# Patient Record
Sex: Female | Born: 1940 | Race: White | Hispanic: No | Marital: Married | Smoking: Never smoker
Health system: Southern US, Community
[De-identification: ages and names within clinical notes are randomized; demographics above are authoritative.]

## PROBLEM LIST (undated history)

## (undated) DIAGNOSIS — F411 Generalized anxiety disorder: Secondary | ICD-10-CM

## (undated) DIAGNOSIS — I10 Essential (primary) hypertension: Secondary | ICD-10-CM

## (undated) DIAGNOSIS — M949 Disorder of cartilage, unspecified: Secondary | ICD-10-CM

## (undated) DIAGNOSIS — R002 Palpitations: Secondary | ICD-10-CM

## (undated) DIAGNOSIS — M899 Disorder of bone, unspecified: Secondary | ICD-10-CM

## (undated) DIAGNOSIS — E785 Hyperlipidemia, unspecified: Secondary | ICD-10-CM

## (undated) DIAGNOSIS — R0602 Shortness of breath: Secondary | ICD-10-CM

## (undated) DIAGNOSIS — M1990 Unspecified osteoarthritis, unspecified site: Secondary | ICD-10-CM

## (undated) HISTORY — DX: Disorder of bone, unspecified: M89.9

## (undated) HISTORY — DX: Hyperlipidemia, unspecified: E78.5

## (undated) HISTORY — PX: COLONOSCOPY: SHX174

## (undated) HISTORY — DX: Palpitations: R00.2

## (undated) HISTORY — DX: Disorder of cartilage, unspecified: M94.9

## (undated) HISTORY — DX: Essential (primary) hypertension: I10

## (undated) HISTORY — DX: Shortness of breath: R06.02

## (undated) HISTORY — DX: Generalized anxiety disorder: F41.1

## (undated) HISTORY — DX: Unspecified osteoarthritis, unspecified site: M19.90

---
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
## 1999-02-27 ENCOUNTER — Encounter: Payer: Self-pay | Admitting: Gynecology

## 1999-03-03 ENCOUNTER — Inpatient Hospital Stay (HOSPITAL_COMMUNITY): Admission: RE | Admit: 1999-03-03 | Discharge: 1999-03-05 | Payer: Self-pay | Admitting: Gynecology

## 1999-03-03 ENCOUNTER — Encounter (INDEPENDENT_AMBULATORY_CARE_PROVIDER_SITE_OTHER): Payer: Self-pay | Admitting: *Deleted

## 1999-05-26 HISTORY — PX: BILATERAL SALPINGOOPHORECTOMY: SHX1223

## 1999-05-26 HISTORY — PX: BREAST CYST ASPIRATION: SHX578

## 1999-05-26 HISTORY — PX: TOTAL ABDOMINAL HYSTERECTOMY: SHX209

## 1999-09-18 ENCOUNTER — Other Ambulatory Visit: Admission: RE | Admit: 1999-09-18 | Discharge: 1999-09-18 | Payer: Self-pay | Admitting: Gynecology

## 1999-10-22 ENCOUNTER — Other Ambulatory Visit: Admission: RE | Admit: 1999-10-22 | Discharge: 1999-10-22 | Payer: Self-pay | Admitting: Gynecology

## 2000-08-01 ENCOUNTER — Emergency Department (HOSPITAL_COMMUNITY): Admission: EM | Admit: 2000-08-01 | Discharge: 2000-08-01 | Payer: Self-pay | Admitting: Emergency Medicine

## 2000-11-01 ENCOUNTER — Other Ambulatory Visit: Admission: RE | Admit: 2000-11-01 | Discharge: 2000-11-01 | Payer: Self-pay | Admitting: Gynecology

## 2001-11-18 ENCOUNTER — Other Ambulatory Visit: Admission: RE | Admit: 2001-11-18 | Discharge: 2001-11-18 | Payer: Self-pay | Admitting: Gynecology

## 2003-02-28 ENCOUNTER — Ambulatory Visit (HOSPITAL_COMMUNITY): Admission: RE | Admit: 2003-02-28 | Discharge: 2003-02-28 | Payer: Self-pay | Admitting: Gastroenterology

## 2003-11-28 ENCOUNTER — Other Ambulatory Visit: Admission: RE | Admit: 2003-11-28 | Discharge: 2003-11-28 | Payer: Self-pay | Admitting: Gynecology

## 2004-05-27 ENCOUNTER — Emergency Department (HOSPITAL_COMMUNITY): Admission: EM | Admit: 2004-05-27 | Discharge: 2004-05-27 | Payer: Self-pay | Admitting: Emergency Medicine

## 2004-12-10 ENCOUNTER — Other Ambulatory Visit: Admission: RE | Admit: 2004-12-10 | Discharge: 2004-12-10 | Payer: Self-pay | Admitting: Gynecology

## 2005-06-10 ENCOUNTER — Ambulatory Visit (HOSPITAL_BASED_OUTPATIENT_CLINIC_OR_DEPARTMENT_OTHER): Admission: RE | Admit: 2005-06-10 | Discharge: 2005-06-10 | Payer: Self-pay | Admitting: Orthopedic Surgery

## 2005-06-10 ENCOUNTER — Encounter (INDEPENDENT_AMBULATORY_CARE_PROVIDER_SITE_OTHER): Payer: Self-pay | Admitting: *Deleted

## 2006-01-07 ENCOUNTER — Other Ambulatory Visit: Admission: RE | Admit: 2006-01-07 | Discharge: 2006-01-07 | Payer: Self-pay | Admitting: Gynecology

## 2006-10-11 ENCOUNTER — Ambulatory Visit: Payer: Self-pay | Admitting: Pulmonary Disease

## 2006-10-11 LAB — CONVERTED CEMR LAB
Basophils Absolute: 0 10*3/uL (ref 0.0–0.1)
Bilirubin Urine: NEGATIVE
Bilirubin, Direct: 0.1 mg/dL (ref 0.0–0.3)
Cholesterol: 178 mg/dL (ref 0–200)
Eosinophils Absolute: 0.1 10*3/uL (ref 0.0–0.6)
GFR calc Af Amer: 93 mL/min
GFR calc non Af Amer: 77 mL/min
Glucose, Bld: 96 mg/dL (ref 70–99)
HCT: 39.6 % (ref 36.0–46.0)
HDL: 47.9 mg/dL (ref >39.0)
Ketones, ur: NEGATIVE mg/dL
LDL Cholesterol: 103 mg/dL — ABNORMAL HIGH (ref 0–99)
MCHC: 35.6 g/dL (ref 30.0–36.0)
MCV: 92.4 fL (ref 78.0–100.0)
Monocytes Relative: 6.7 % (ref 3.0–11.0)
Neutrophils Relative %: 61.1 % (ref 43.0–77.0)
Nitrite: NEGATIVE
Potassium: 4.8 meq/L (ref 3.5–5.1)
RBC: 4.28 M/uL (ref 3.87–5.11)
RDW: 11.6 % (ref 11.5–14.6)
Sodium: 143 meq/L (ref 135–145)
Specific Gravity, Urine: <=1.005 (ref 1.000–1.03)
TSH: 0.99 microintl units/mL (ref 0.35–5.50)
Total CHOL/HDL Ratio: 3.7
Total Protein, Urine: NEGATIVE mg/dL
Triglycerides: 135 mg/dL (ref 0–149)
pH: 7 (ref 5.0–8.0)

## 2007-02-16 ENCOUNTER — Other Ambulatory Visit: Admission: RE | Admit: 2007-02-16 | Discharge: 2007-02-16 | Payer: Self-pay | Admitting: Gynecology

## 2007-03-24 ENCOUNTER — Ambulatory Visit: Payer: Self-pay | Admitting: Pulmonary Disease

## 2007-04-06 ENCOUNTER — Encounter: Payer: Self-pay | Admitting: Pulmonary Disease

## 2007-07-28 ENCOUNTER — Ambulatory Visit: Payer: Self-pay | Admitting: Pulmonary Disease

## 2007-07-28 DIAGNOSIS — I10 Essential (primary) hypertension: Secondary | ICD-10-CM | POA: Insufficient documentation

## 2007-07-28 DIAGNOSIS — R0602 Shortness of breath: Secondary | ICD-10-CM | POA: Insufficient documentation

## 2007-07-28 DIAGNOSIS — E785 Hyperlipidemia, unspecified: Secondary | ICD-10-CM

## 2007-08-01 LAB — CONVERTED CEMR LAB
Alkaline Phosphatase: 49 units/L (ref 39–117)
BUN: 15 mg/dL (ref 6–23)
Basophils Relative: 0.9 % (ref 0.0–1.0)
CO2: 31 meq/L (ref 19–32)
Creatinine, Ser: 1 mg/dL (ref 0.4–1.2)
Eosinophils Relative: 2.6 % (ref 0.0–5.0)
HCT: 41.5 % (ref 36.0–46.0)
Hemoglobin: 13.9 g/dL (ref 12.0–15.0)
Monocytes Absolute: 0.6 10*3/uL (ref 0.2–0.7)
Monocytes Relative: 7 % (ref 3.0–11.0)
Neutrophils Relative %: 61.5 % (ref 43.0–77.0)
Potassium: 4.6 meq/L (ref 3.5–5.1)
RDW: 12.4 % (ref 11.5–14.6)
Total Bilirubin: 0.9 mg/dL (ref 0.3–1.2)
Total Protein: 6.9 g/dL (ref 6.0–8.3)
WBC: 9.1 10*3/uL (ref 4.5–10.5)

## 2007-08-25 ENCOUNTER — Ambulatory Visit: Payer: Self-pay | Admitting: Pulmonary Disease

## 2007-08-25 DIAGNOSIS — R002 Palpitations: Secondary | ICD-10-CM | POA: Insufficient documentation

## 2007-08-25 DIAGNOSIS — F411 Generalized anxiety disorder: Secondary | ICD-10-CM | POA: Insufficient documentation

## 2007-08-28 DIAGNOSIS — M899 Disorder of bone, unspecified: Secondary | ICD-10-CM

## 2007-08-28 DIAGNOSIS — M1990 Unspecified osteoarthritis, unspecified site: Secondary | ICD-10-CM | POA: Insufficient documentation

## 2007-08-28 DIAGNOSIS — M949 Disorder of cartilage, unspecified: Secondary | ICD-10-CM

## 2008-02-22 ENCOUNTER — Encounter: Payer: Self-pay | Admitting: Pulmonary Disease

## 2008-02-29 ENCOUNTER — Ambulatory Visit: Payer: Self-pay | Admitting: Pulmonary Disease

## 2008-03-03 ENCOUNTER — Emergency Department (HOSPITAL_COMMUNITY): Admission: EM | Admit: 2008-03-03 | Discharge: 2008-03-03 | Payer: Self-pay | Admitting: Emergency Medicine

## 2008-03-21 ENCOUNTER — Ambulatory Visit: Payer: Self-pay | Admitting: Internal Medicine

## 2008-05-28 ENCOUNTER — Encounter: Payer: Self-pay | Admitting: Pulmonary Disease

## 2008-09-21 ENCOUNTER — Telehealth: Payer: Self-pay | Admitting: Pulmonary Disease

## 2009-03-08 ENCOUNTER — Ambulatory Visit: Payer: Self-pay | Admitting: Pulmonary Disease

## 2009-04-01 ENCOUNTER — Encounter: Payer: Self-pay | Admitting: Pulmonary Disease

## 2009-05-31 ENCOUNTER — Ambulatory Visit (HOSPITAL_COMMUNITY): Admission: RE | Admit: 2009-05-31 | Discharge: 2009-05-31 | Payer: Self-pay | Admitting: Gynecology

## 2009-06-17 ENCOUNTER — Telehealth (INDEPENDENT_AMBULATORY_CARE_PROVIDER_SITE_OTHER): Payer: Self-pay | Admitting: *Deleted

## 2009-08-05 ENCOUNTER — Ambulatory Visit: Payer: Self-pay | Admitting: Pulmonary Disease

## 2010-04-28 ENCOUNTER — Encounter: Payer: Self-pay | Admitting: Pulmonary Disease

## 2010-05-25 ENCOUNTER — Emergency Department (HOSPITAL_COMMUNITY)
Admission: EM | Admit: 2010-05-25 | Discharge: 2010-05-25 | Payer: Self-pay | Source: Home / Self Care | Admitting: Family Medicine

## 2010-06-04 ENCOUNTER — Ambulatory Visit (HOSPITAL_COMMUNITY)
Admission: RE | Admit: 2010-06-04 | Discharge: 2010-06-04 | Payer: Self-pay | Source: Home / Self Care | Attending: Gynecology | Admitting: Gynecology

## 2010-06-24 NOTE — Progress Notes (Signed)
Summary: Atenolol refill  Phone Note Call from Patient Call back at Home Phone [phone]   Caller: Patient Call For: nadel Reason for Call: Talk to Nurse Summary of Call: pt has appt 08/05/2009 - Needs refill on her Atenolol until she gwets in on 08/05/2009, [facility] [address] Initial call taken by: Eugene Gavia,  June 17, 2009 10:32 AM  Follow-up for Phone Call        Patient last seen 08/25/2007 by SN.  The patient has scheduled an appt with SN in March 2011. Please advise of Atenolol refills. Michel Bickers [facility]  June 17, 2009 10:40 AM   refill sent to pharmacy for pt---pt is aware Randell Loop CMA  June 17, 2009 10:44 AM     Prescriptions: ATENOLOL 50 MG  TABS (ATENOLOL) take 1/2 tab by mouth two times a day  #30 Each x 1   Entered by:   Randell Loop CMA   Authorized by:   Michele Mcalpine MD   Signed by:   Randell Loop CMA on 06/17/2009   Method used:   Electronically to        [facility]  [phone]* (retail)       [address]       [place], [place]  [address]       Ph: [phone] or [phone]       Fax: [phone]   RxID:   [id number]

## 2010-06-24 NOTE — Assessment & Plan Note (Signed)
Summary: rov/meds/apc   CC:  2 year ROV & review of medical problems....  History of Present Illness: 70 y/o WF here for a follow up visit...   ~  Apr09:  I last saw her 5/08 for a new pt CPX- see prob list below... she was in 07/28/07 to see the nurse practitioner c/o 2 week hx of a funny feeling on exhalation... interpreted as dyspnea- she had a CXR= clear lungs, borderline hrt size w/o change;  Labs= norm CBC/ CMet/ TSH.... she returns today and now describes the sensation more like a palpitation or fluttering that she tends to note more when she exhales... it is not exercise related and she works out on treadmill for 30+ min without symptoms... she drinks 2 cups of coffee per day, denies tea/ colas/ choc/ etc... denies pseudophed OTC meds, etc... she is a non-smoker... we decided to Rx w/ Alpraz & symptoms improved (everything apparently started after a traumatic shock)...   ~  August 05, 2009:  she notes only occas palpit/ dyspnea w/ stress... she's been doing well overall w/ BP controlled on meds;  and Chol checked by DrLomax last fall- OK... needs refill perscriptions today, requesting cream for rash, and due for PNEUMOVAX & TDAP...    Current Problems:   DYSPNEA (ICD-786.05) PALPITATIONS (ICD-785.1) - pt notes these started after an emotional shock, and symptoms have abated... she was rec to take Alprazolam but she declined to get this filled...  HYPERTENSION (ICD-401.9) - controlled on ATENOLOL 50mg - 1/2 tabBid... BP=112/64 and pt denies HA, fatigue, visual changes, CP, dizziness, syncope, edema, etc...  ~  she had a neg cardiac eval w/ treadmill 2006 from DrHSmith...  HYPERLIPIDEMIA (ICD-272.4) - on LIPITOR 20mg - 1/2 tab daily...   ~  last FLP 5/08 showed TChol 178, TG 135, HDL 48, LDL 103  ~  FLP 11/10 by DrLomax showed TChol 179, TG 140, HDL 55, LDL 105  ANXIETY (WUJ-811.91) - we discussed "catecholamine excess" in relation to her palpit/ dyspnea/ etc and rec Alpraz 1/2 to 1 tab by  mouth three times a day for these symptoms...  DEGENERATIVE JOINT DISEASE, MILD (ICD-715.90) - prev shot in knee from DrCollins...  OSTEOPENIA (ICD-733.90) - BMD's followed for yrs by her GYN DrLomax...  ~  last BMD 9/08 w/ TScores -0.5 to -1.5 and improved on Vivelle dot, Ca++, Vits...  GI = DrGanem w/ neg colon 2004 and neg sonar 2006... she uses Pepcid as needed. GU = DrLomax as noted, on Vivelle dot... [facility]...   Allergies: 1)  ! Penicillin 2)  ! Tetracycline  Comments:  Nurse/Medical Assistant: The patient's medications and allergies were reviewed with the patient and were updated in the Medication and Allergy Lists.  Past History:  Past Medical History:  DYSPNEA (ICD-786.05) PALPITATIONS (ICD-785.1) HYPERTENSION (ICD-401.9) HYPERLIPIDEMIA (ICD-272.4) ANXIETY (ICD-300.00) DEGENERATIVE JOINT DISEASE, MILD (ICD-715.90) OSTEOPENIA (ICD-733.90)  Past Surgical History: S/P TAH and BSO  Family History: Reviewed history from 03/21/2008 and no changes required. Father-Melonoma             Brain Tumor, deceased age 83 Mother-Pnuemonia, deceased age 11 3 Siblings- arthritis Family hx: allergies, asthma, arthritis  Social History: Reviewed history from 03/21/2008 and no changes required. Married 2 children Never Smoked Exercises 2 x week Drinks 3 cups caffeine/day  Review of Systems      See HPI  The patient denies anorexia, fever, weight loss, weight gain, vision loss, decreased hearing, hoarseness, chest pain, syncope, dyspnea on exertion, peripheral  edema, prolonged cough, headaches, hemoptysis, abdominal pain, melena, hematochezia, severe indigestion/heartburn, hematuria, incontinence, muscle weakness, suspicious skin lesions, transient blindness, difficulty walking, depression, unusual weight change, abnormal bleeding, enlarged lymph nodes, and angioedema.    Vital Signs:  Patient profile:   69 year old female Height:      61.5  inches Weight:      129 pounds BMI:     24.07 O2 Sat:      99 % on Room air Temp:     99.1 degrees F oral Pulse rate:   67 / minute BP sitting:   112 / 64  (left arm) Cuff size:   regular  Vitals Entered By: Randell Loop CMA (August 05, 2009 10:37 AM)  O2 Sat at Rest %:  99 O2 Flow:  Room air CC: 2 year ROV & review of medical problems... Is Patient Diabetic? No Pain Assessment Patient in pain? no      Comments meds updated today   Physical Exam  Additional Exam:  WD, WN, 71 y/o WF in NAD... GENERAL:  Alert & oriented; pleasant & cooperative... HEENT:  Eugenio Saenz/AT, EOM-wnl, PERRLA, EACs-clear, TMs-wnl, NOSE-clear, THROAT-clear & wnl. NECK:  Supple w/ full ROM; no JVD; normal carotid impulses w/o bruits; no thyromegaly or nodules palpated; no lymphadenopathy. CHEST:  Clear to P & A; without wheezes/ rales/ or rhonchi. HEART:  Regular Rhythm; without murmurs/ rubs/ or gallops. ABDOMEN:  Soft & nontender; normal bowel sounds; no organomegaly or masses detected. EXT: without deformities or arthritic changes; no varicose veins/ venous insuffic/ or edema. NEURO:  CN's intact; motor testing normal; sensory testing normal; gait normal & balance OK. DERM:  No lesions noted; no rash etc...    Impression & Recommendations:  Problem # 1:  DYSPNEA (ICD-786.05) Dyspnea & Palpit are improved... they started w/ an emotional shock, she says & now improved... she knows to avoid caffeine & doesn't use pseudophed etc...  Problem # 2:  HYPERTENSION (ICD-401.9) BP controlled-  same meds. Her updated medication list for this problem includes:    Atenolol 50 Mg Tabs (Atenolol) .Marland Kitchen... Take 1/2 tab by mouth two times a day  Problem # 3:  HYPERLIPIDEMIA (ICD-272.4) FLP by DrLomax 11/10 reviewd... labs look good continue diet + exercise & the 10mg  Lipitor... Her updated medication list for this problem includes:    Lipitor 20 Mg Tabs (Atorvastatin calcium) .Marland Kitchen... Take 1/2 tab by mouth once  daily  Problem # 4:  DEGENERATIVE JOINT DISEASE, MILD (ICD-715.90) Aware-  pt is stable... Her updated medication list for this problem includes:    Low-dose Aspirin 81 Mg Tabs (Aspirin) .Marland Kitchen... Take 1 tablet by mouth once a day  Problem # 5:  OTHER MEDICAL PROBLEMS AS NOTED>>>  Complete Medication List: 1)  Low-dose Aspirin 81 Mg Tabs (Aspirin) .... Take 1 tablet by mouth once a day 2)  Atenolol 50 Mg Tabs (Atenolol) .... Take 1/2 tab by mouth two times a day 3)  Lipitor 20 Mg Tabs (Atorvastatin calcium) .... Take 1/2 tab by mouth once daily 4)  Vivelle-dot 0.075 Mg/24hr Pttw (Estradiol) .... Take 1 tablet by mouth once a day 5)  Vitamin D3 2000 Unit Caps (Cholecalciferol) .... Take 1 tablet by mouth once a day 6)  Caltrate 600+d 600-400 Mg-unit Tabs (Calcium carbonate-vitamin d) .... Take one tab by mouth two times a day... 7)  Ocuvite Adult Formula Caps (Multiple vitamins-minerals) .... Take 1 capsule by mouth once a day 8)  Fluocinonide 0.05 % Crea (Fluocinonide) .... Apply  as needed...  Other Orders: Prescription Created Electronically [id number]) Tdap => 73yrs IM (98119) Admin 1st Vaccine (14782) Pneumococcal Vaccine (95621) Admin of Any Addtl Vaccine (30865)  Patient Instructions: 1)  Today we updated your med list- see below.... 2)  We refilled your meds for 2011... 3)  We also wrote a new perscription for a cream to use as we discussed... 4)  We also gave you 2 vaccinations today>> PNEUMOVAX (Pneumonia vaccine- this is the only one you will need based on 2011 criteria);  and the TDAP (combined tetanus & pertussis vaccine- indicated every 37yrs). 5)  Call for any problems.Marland KitchenMarland Kitchen 6)  Please schedule a follow-up appointment in 1 year, sooner as needed. Prescriptions: FLUOCINONIDE 0.05 % CREA (FLUOCINONIDE) apply as needed...  #30 gm tube x prn   Entered and Authorized by:   Michele Mcalpine MD   Signed by:   Michele Mcalpine MD on 08/05/2009   Method used:   Print then Give to Patient    RxID:   [id number] LIPITOR 20 MG  TABS (ATORVASTATIN CALCIUM) take 1/2 tab by mouth once daily  #30 x prn   Entered and Authorized by:   Michele Mcalpine MD   Signed by:   Michele Mcalpine MD on 08/05/2009   Method used:   Print then Give to Patient   RxID:   [id number] ATENOLOL 50 MG  TABS (ATENOLOL) take 1/2 tab by mouth two times a day  #30 x prn   Entered and Authorized by:   Michele Mcalpine MD   Signed by:   Michele Mcalpine MD on 08/05/2009   Method used:   Print then Give to Patient   RxID:   [id number]    Immunizations Administered:  Tetanus Vaccine:    Vaccine Type: Tdap    Site: left deltoid    Mfr: boostrix    Dose: 0.5 ml    Route: IM    Given by: Randell Loop CMA    Exp. Date: 11/23/2010    Lot #: [id number]    VIS given: 04/12/07 version given August 05, 2009.  Pneumonia Vaccine:    Vaccine Type: Pneumovax    Site: right deltoid    Mfr: Merck    Dose: 0.5 ml    Route: IM    Given by: Randell Loop CMA    Exp. Date: 01/06/2011    Lot #: [id number]    VIS given: 12/21/95 version given August 05, 2009.

## 2010-06-26 NOTE — Letter (Signed)
Summary: Gretta Cool MD  Gretta Cool MD   Imported By: Lester Rialto 05/08/2010 10:05:41  _____________________________________________________________________  External Attachment:    Type:   Image     Comment:   External Document

## 2010-07-23 ENCOUNTER — Telehealth (INDEPENDENT_AMBULATORY_CARE_PROVIDER_SITE_OTHER): Payer: Self-pay | Admitting: *Deleted

## 2010-07-31 NOTE — Progress Notes (Signed)
Summary: heart rate/ bp  Phone Note Call from Patient Call back at [facility] Phone [phone]   Caller: Patient Call For: nadel Summary of Call: pt's heart rate was 135 yesterday "about half an hr after excercising". today her bp is 135/ 75. pt wants to know if she should make any changes. she is going to [place] next week and just wants dr Kirstie Mirza recs before she goes.  Initial call taken by: Tivis Ringer, CNA,  July 23, 2010 9:11 AM  Follow-up for Phone Call        Pt states her baseline BP-118-120/60's, was on the Elliptical yesterday and felt an irregular heartrate. Pt waited approx 30 mins before checking same again and was P-135. Pt denies headaches, vision changes, dizziness, nausea. Today BP-135/75. Pt requested SN recs since she will be going out of town. Pt states no change in medications, still taking Atenolol 50mg  1/2 twice daily. Please advise. Thanks. Zackery Barefoot CMA  July 23, 2010 11:37 AM   Additional Follow-up for Phone Call Additional follow up Details #1::        per SN---needs to avoid all caffeine--coffee,tea, cola, chocolate etc...   she had cardiac treadmill in 2006 by Dr. Lavell Islam are for cardiology follow up if these symptoms persist.  thanks Randell Loop CMA  July 23, 2010 2:40 PM   Called, spoke with pt.  She was informed of above recs per SN and verbalized understanding.  States she will avoid caffeine and monitor sxs.  If they persist she will see cardiology -- will call back if referral needed since last cardiology visit was in 2006.  Also advised pt if sxs worsen while out of town to go to ER/Urgent Care to be evaluated.  She verbalized understanding. Additional Follow-up by: Gweneth Dimitri RN,  July 23, 2010 2:54 PM

## 2010-08-12 ENCOUNTER — Encounter: Payer: Self-pay | Admitting: Pulmonary Disease

## 2010-08-15 ENCOUNTER — Ambulatory Visit: Payer: Self-pay | Admitting: Pulmonary Disease

## 2010-08-18 ENCOUNTER — Encounter: Payer: Self-pay | Admitting: Pulmonary Disease

## 2010-08-18 ENCOUNTER — Ambulatory Visit (INDEPENDENT_AMBULATORY_CARE_PROVIDER_SITE_OTHER): Payer: Medicare Other | Admitting: Pulmonary Disease

## 2010-08-18 DIAGNOSIS — E785 Hyperlipidemia, unspecified: Secondary | ICD-10-CM

## 2010-08-18 DIAGNOSIS — F411 Generalized anxiety disorder: Secondary | ICD-10-CM

## 2010-08-18 DIAGNOSIS — M899 Disorder of bone, unspecified: Secondary | ICD-10-CM

## 2010-08-18 DIAGNOSIS — I10 Essential (primary) hypertension: Secondary | ICD-10-CM

## 2010-08-18 MED ORDER — ATORVASTATIN CALCIUM 40 MG PO TABS: 40.0000 mg | ORAL_TABLET | Freq: Every day | ORAL | Status: DC

## 2010-08-18 MED ORDER — ATENOLOL 50 MG PO TABS: 50.0000 mg | ORAL_TABLET | Freq: Every day | ORAL | Status: DC

## 2010-08-18 MED ORDER — ATORVASTATIN CALCIUM 20 MG PO TABS: ORAL_TABLET | ORAL | Status: DC

## 2010-08-18 NOTE — Assessment & Plan Note (Signed)
She has been doing very well;  Could have used some alpraz on the flight to [place] but managed OK;  Doesn't want refill Rx at this time.Marland KitchenMarland Kitchen

## 2010-08-18 NOTE — Progress Notes (Signed)
  Subjective:    Patient ID: Katelyn [name], female    DOB: 06/19/1940, 70 y.o.   MRN: [id number]  HPI 70 y/o WF here for a follow up visit... She has mult medical problems including:  HBP;  Hyperlipidemia;  DJD;  Osteopenia; and Anxiety...   ~  August 18, 2010:  Yearly ROV doing well w/o new complaints or concerns... Just ret from trip to [place] 7 did some hiking etc all w/o difficulty.    HBP>  On Atenolol 50mg - taking 1/2 Bid (her choice) & tol well, BP= 116/84 & similar at home, denies CP, palpit, SOB, edema, etc...    Chol>  On Lipitor 20mg - taking 1/2 tab daily;  FLP by [facility] 12/11 showed TChol 157, TG 101, HDL 51, LDL 92; continue same...    DJD/ Osteopenia>  Followed by [facility] Prima- last BMD 2008 showed TScores -0.5 to -1.5 & she has been on Vivelle dot, calcium, MVI, Vity D 2000u/d;  I would rec f/u BMD & she will check w/ GYN about this;  Notes some mild arthritic complaints intermittently> knees, neck, back & uses OTC Aleve etc...     Anxiety>  She had some Alpraz to use in the past but doing fine & does not want refill at this time...  Health Maintenance: GI = DrGanem w/ neg colon 2004 and neg sonar 2006... she uses Pepcid as needed; f/u colon in 2014... GU = DrLomax as noted, on Vivelle dot... [facility])... She had Pneumovax & TDAP 3/11; and rec to get the yearly seasonal flu vaccine in the fall.   Review of Systems       See HPI  The patient denies anorexia, fever, weight loss, weight gain, vision loss, decreased hearing, hoarseness, chest pain, syncope, dyspnea on exertion, peripheral edema, prolonged cough, headaches, hemoptysis, abdominal pain, melena, hematochezia, severe indigestion/heartburn, hematuria, incontinence, muscle weakness, suspicious skin lesions, transient blindness, difficulty walking, depression, unusual weight change, abnormal bleeding, enlarged lymph nodes, and angioedema.     Objective:   Physical Exam      WD, WN, 70  y/o WF in NAD... GENERAL:  Alert & oriented; pleasant & cooperative... HEENT:  /AT, EOM-wnl, PERRLA, EACs-clear, TMs-wnl, NOSE-clear, THROAT-clear & wnl. NECK:  Supple w/ full ROM; no JVD; normal carotid impulses w/o bruits; no thyromegaly or nodules palpated; no lymphadenopathy. CHEST:  Clear to P & A; without wheezes/ rales/ or rhonchi. HEART:  Regular Rhythm; without murmurs/ rubs/ or gallops. ABDOMEN:  Soft & nontender; normal bowel sounds; no organomegaly or masses detected. EXT: without deformities or arthritic changes; no varicose veins/ venous insuffic/ or edema. NEURO:  CN's intact; motor testing normal; sensory testing normal; gait normal & balance OK. DERM:  No lesions noted; no rash etc...   Assessment & Plan:

## 2010-08-18 NOTE — Assessment & Plan Note (Signed)
BP very stable on her Atenolol 50mg - 1/2 Bid;  We discussed low sodium diet etc;  Continue same med.

## 2010-08-18 NOTE — Assessment & Plan Note (Signed)
She has her labs done by drLomax & he sent copies to [place] from her 12/11 eval>  FLP looks good on her Lipitor 20mg - taking 1/2 tab daily;  Continue same + diet efforts.

## 2010-08-18 NOTE — Assessment & Plan Note (Signed)
She has her BMDs done by DrLomax & I reviewed his note from 12/11> last BMD 2008 & she had mod osteopenia w/ TScore -1.5 (?FemNeck);  I'd rec f/u BMD at this time & she will check w/ DrLomax for his opinion.Marland KitchenMarland Kitchen

## 2010-08-18 NOTE — Patient Instructions (Signed)
Katelyn [name], it was good seeing you again... We updated your med list today... We refilled your Atenolol & Lipitor as discussed... We reviewed your recent data & you appear to be up to date on all needed screening & vaccinations... Stay as active as possible... Call for any problems... When it is convenient> give DrLomax a call to inquire when your next Bone Density test is due... Let's plan a follow up visit in 49yr, sooner if needed.Marland KitchenMarland Kitchen

## 2010-09-08 ENCOUNTER — Telehealth: Payer: Self-pay | Admitting: Pulmonary Disease

## 2010-09-08 NOTE — Telephone Encounter (Signed)
Spoke w/ denise and she just wanted to clarify pt rx for atenolol. I advised him per rx pt to take 1/2 tablet bid. Denis verbalized understanding and needed nothing else further

## 2010-10-10 NOTE — Op Note (Signed)
NAME:  Katelyn [name], Katelyn [name]           ACCOUNT NO.:  [id number]   MEDICAL RECORD NO.:  [id number]          PATIENT TYPE:  AMB   LOCATION:  DSC                          FACILITY:  [facility]   PHYSICIAN:  Matthew A. 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   OPERATION PERFORMED:  Excisional biopsy of masses x 2.   SURGEON:  Artist Pais. Mina Marble, M.D.   ASSISTANT:  None.   ANESTHESIA:  General.   TOURNIQUET TIME:  28 minutes.   COMPLICATIONS:  None.   DRAINS:  None.   SPECIMENS:  Two were sent.   DESCRIPTION OF PROCEDURE:  The patient was taken to the operating room and  after the induction of adequate general anesthesia the left upper extremity  was prepped and draped in the usual sterile fashion.  An Esmarch was used to  exsanguinate the limb.  Tourniquet was inflated to 250 mmHg.  At that point  a 2.5 to 3 cm incision was made over the volar aspect of the left wrist and  into the interval between the flexor carpi radialis and the radial artery.  Skin was incised.  Dissection was carried down.  The flexor carpi radialis  sheath was incised and this was retracted.  The radial artery retracted as  well.  A cystic type mass was seen coming from between these two.  This was  carefully removed.  Hemostasis was achieved with bipolar cautery.  The wound  was irrigated and was closed with 3-0 Prolene in subcuticular stitch.  The  second incision was made 4 cm proximal and radial to this over a large  subcutaneous mass.  The skin was incised.  There was a large lipomatous type  lesion, coming from just under the brachioradialis insertion.  This was  carefully dissected free with care to carefully identify and retract the  superficial  radial nerve and  branches of the radial artery.  After this was done, this  wound was also thoroughly irrigated and loosely closed with 3-0 Prolene  subcuticular stitch, Steri-Strips, 4 x 4s and fluffs and a volar splint was  applied.  The patient tolerated the procedure well and went to recovery room  in stable fashion.      Artist Pais Mina Marble, M.D.  Electronically Signed     MAW/MEDQ  D:  06/10/2005  T:  06/10/2005  Job:  [id number]

## 2010-10-10 NOTE — Op Note (Signed)
   NAME:  [name], Katelyn LEE                   ACCOUNT NO.:  [id number]   MEDICAL RECORD NO.:  [id number]                   PATIENT TYPE:  AMB   LOCATION:  ENDO                                 [facility]   PHYSICIAN:  Graylin Shiver, M.D.                DATE OF BIRTH:  November 01, 1940   DATE OF PROCEDURE:  02/28/2003  DATE OF DISCHARGE:                                 OPERATIVE REPORT   PROCEDURE:  Colonoscopy.   INDICATIONS FOR PROCEDURE:  Screening.   Informed consent was obtained after explanation of the risks of bleeding,  infection and perforation.   PREMEDICATION:  Fentanyl 87.5 mcg IV, Versed 7 mg IV.   DESCRIPTION OF PROCEDURE:  With the patient in the left lateral decubitus  position, a rectal exam was performed and no masses were felt. The Olympus  colonoscope was then inserted into the rectum and advanced around the colon  to the cecum. Cecal landmarks were identified. The cecum and ascending colon  were normal. The transverse colon was normal.  The descending colon, sigmoid  and rectum were normal. She tolerated the procedure well without  complications.   IMPRESSION:  Normal colonoscopy to the cecum.                                               Graylin Shiver, M.D.    SFG/MEDQ  D:  02/28/2003  T:  02/28/2003  Job:  [id number]   cc:   Al Decant. Janey Greaser, MD  [address]  [place]  [place] [address]  Fax: [phone]

## 2011-02-23 LAB — CBC
MCV: 91.9
RBC: 4.4
WBC: 6.8

## 2011-02-23 LAB — DIFFERENTIAL
Basophils Absolute: 0
Basophils Relative: 0
Eosinophils Relative: 2
Monocytes Absolute: 0.5
Neutro Abs: 4.1

## 2011-02-23 LAB — POCT CARDIAC MARKERS
CKMB, poc: 1.3
Myoglobin, poc: 51.5

## 2011-03-13 ENCOUNTER — Encounter: Payer: Self-pay | Admitting: Adult Health

## 2011-03-13 ENCOUNTER — Ambulatory Visit (INDEPENDENT_AMBULATORY_CARE_PROVIDER_SITE_OTHER): Payer: Medicare Other | Admitting: Adult Health

## 2011-03-13 VITALS — BP 114/82 | HR 61 | Temp 97.5°F | Ht 61.6 in | Wt 129.6 lb

## 2011-03-13 DIAGNOSIS — K219 Gastro-esophageal reflux disease without esophagitis: Secondary | ICD-10-CM | POA: Insufficient documentation

## 2011-03-13 DIAGNOSIS — Z23 Encounter for immunization: Secondary | ICD-10-CM

## 2011-03-13 NOTE — Patient Instructions (Signed)
High fiber diet, add benefiber daily  Stool softner At bedtime  For 2 weeks then As needed  For constipation  Nexium 40 mg daily for 10 days  Gas X with meals As needed   Increase fluid intake.  Please contact office for sooner follow up if symptoms do not improve or worsen or seek emergency care   Flu shot today

## 2011-03-13 NOTE — Assessment & Plan Note (Signed)
Mild flare with gastritis/constipation   Plan;  High fiber diet, add benefiber daily  Stool softner At bedtime  For 2 weeks then As needed  For constipation  Nexium 40 mg daily for 10 days -samples given  Gas X with meals As needed   Increase fluid intake.  Please contact office for sooner follow up if symptoms do not improve or worsen or seek emergency care   Flu shot today

## 2011-03-13 NOTE — Progress Notes (Signed)
  Subjective:    Patient ID: Katelyn [name], female    DOB: 12-11-1940, 70 y.o.   MRN: [id number]  HPI  70 y/o WF with  mult medical problems including:  HBP;  Hyperlipidemia;  DJD;  Osteopenia; and Anxiety...   ~  August 18, 2010:  Yearly ROV doing well w/o new complaints or concerns... Just ret from trip to [place] 7 did some hiking etc all w/o difficulty.    HBP>  On Atenolol 50mg - taking 1/2 Bid (her choice) & tol well, BP= 116/84 & similar at home, denies CP, palpit, SOB, edema, etc...    Chol>  On Lipitor 20mg - taking 1/2 tab daily;  FLP by [facility] 12/11 showed TChol 157, TG 101, HDL 51, LDL 92; continue same...    DJD/ Osteopenia>  Followed by Ahmed Prima- last BMD 2008 showed TScores -0.5 to -1.5 & she has been on Vivelle dot, calcium, MVI, Vity D 2000u/d;  I would rec f/u BMD & she will check w/ GYN about this;  Notes some mild arthritic complaints intermittently> knees, neck, back & uses OTC Aleve etc...     Anxiety>  She had some Alpraz to use in the past but doing fine & does not want refill at this time...  Health Maintenance: GI = DrGanem w/ neg colon 2004 and neg sonar 2006... she uses Pepcid as needed; f/u colon in 2014... GU = DrLomax as noted, on Vivelle dot... [facility])... She had Pneumovax & TDAP 3/11; and rec to get the yearly seasonal flu vaccine in the fall.  03/13/2011 Acute OV  Complains of increased gas and stomach discomfort, epigastric, with  Some Belching for 1 month. Feels bloated and gas.  No vomitting , nausea, diarrhea, bloody stools. Stools have been harder, feels she does not completely empty after BM. No new meds or travel. Under some stress at home with home renovations.   Review of Systems        See HPI  The patient denies anorexia, fever, weight loss, weight gain, vision loss, decreased hearing, hoarseness, chest pain, syncope, dyspnea on exertion, peripheral edema, prolonged cough, headaches, hemoptysis, abdominal pain,  melena, hematochezia, severe indigestion/heartburn, hematuria, incontinence, muscle weakness, suspicious skin lesions, transient blindness, difficulty walking, depression, unusual weight change, abnormal bleeding, enlarged lymph nodes, and angioedema.     Objective:   Physical Exam       WD, WN, 70 y/o WF in NAD... GENERAL:  Alert & oriented; pleasant & cooperative... HEENT:  [place]/AT, EOM-wnl, PERRLA, EACs-clear, TMs-wnl, NOSE-clear, THROAT-clear & wnl. NECK:  Supple w/ full ROM; no JVD; normal carotid impulses w/o bruits; no thyromegaly or nodules palpated; no lymphadenopathy. CHEST:  Clear to P & A; without wheezes/ rales/ or rhonchi. HEART:  Regular Rhythm; without murmurs/ rubs/ or gallops. ABDOMEN:  Soft & nontender; normal bowel sounds; no organomegaly or masses detected. No guarding or rebound , neg CVA tendness.  EXT: without deformities or arthritic changes; no varicose veins/ venous insuffic/ or edema. NEURO:  gait normal & balance OK. DERM:  No lesions noted; no rash etc...   Assessment & Plan:

## 2011-03-13 NOTE — Progress Notes (Signed)
Addended by: Abigail Miyamoto D on: 03/13/2011 11:55 AM   Modules accepted: Orders

## 2011-06-16 ENCOUNTER — Other Ambulatory Visit (HOSPITAL_COMMUNITY): Payer: Self-pay | Admitting: Gynecology

## 2011-06-16 DIAGNOSIS — Z1231 Encounter for screening mammogram for malignant neoplasm of breast: Secondary | ICD-10-CM

## 2011-06-30 ENCOUNTER — Other Ambulatory Visit: Payer: Self-pay | Admitting: Gynecology

## 2011-07-08 ENCOUNTER — Ambulatory Visit (HOSPITAL_COMMUNITY)
Admission: RE | Admit: 2011-07-08 | Discharge: 2011-07-08 | Disposition: A | Discharge status: Home / Self Care | Payer: Medicare Other | Source: Ambulatory Visit | Attending: Gynecology | Admitting: Gynecology

## 2011-07-08 DIAGNOSIS — Z1231 Encounter for screening mammogram for malignant neoplasm of breast: Secondary | ICD-10-CM | POA: Insufficient documentation

## 2011-08-18 ENCOUNTER — Ambulatory Visit (INDEPENDENT_AMBULATORY_CARE_PROVIDER_SITE_OTHER): Payer: Medicare Other | Admitting: Pulmonary Disease

## 2011-08-18 ENCOUNTER — Encounter: Payer: Self-pay | Admitting: Pulmonary Disease

## 2011-08-18 VITALS — BP 120/84 | HR 66 | Temp 97.5°F | Ht 61.5 in | Wt 133.2 lb

## 2011-08-18 DIAGNOSIS — E785 Hyperlipidemia, unspecified: Secondary | ICD-10-CM

## 2011-08-18 DIAGNOSIS — I10 Essential (primary) hypertension: Secondary | ICD-10-CM

## 2011-08-18 DIAGNOSIS — M899 Disorder of bone, unspecified: Secondary | ICD-10-CM

## 2011-08-18 DIAGNOSIS — M949 Disorder of cartilage, unspecified: Secondary | ICD-10-CM

## 2011-08-18 DIAGNOSIS — M1990 Unspecified osteoarthritis, unspecified site: Secondary | ICD-10-CM

## 2011-08-18 DIAGNOSIS — F411 Generalized anxiety disorder: Secondary | ICD-10-CM

## 2011-08-18 MED ORDER — ATENOLOL 50 MG PO TABS: ORAL_TABLET | ORAL | Status: DC

## 2011-08-18 MED ORDER — ATORVASTATIN CALCIUM 20 MG PO TABS: ORAL_TABLET | ORAL | Status: DC

## 2011-08-18 NOTE — Patient Instructions (Signed)
Today we updated your med list in our EPIC system...    Continue your current medications the same...  We refilled your meds per request...  If you get a chance, ask DrLomax to forward copies of your Bone Density 7 blood work to my attention. Thanks.  Call for any problems or if we can be of service in any way...  Let's plan a routine follow up in one years time.Marland KitchenMarland Kitchen

## 2011-08-18 NOTE — Progress Notes (Addendum)
Subjective:    Patient ID: Katelyn [name], female    DOB: June 04, 1940, 71 y.o.   MRN: [id number]  HPI 71 y/o WF here for a follow up visit... She has mult medical problems including:  HBP;  Hyperlipidemia;  DJD;  Osteopenia; and Anxiety...   ~  August 18, 2010:  Yearly ROV doing well w/o new complaints or concerns... Just ret from trip to [place] & did some hiking etc all w/o difficulty.    HBP>  On Atenolol 50mg - taking 1/2 Bid (her choice) & tol well, BP= 116/84 & similar at home, denies CP, palpit, SOB, edema, etc...    Chol>  On Lipitor 20mg - taking 1/2 tab daily;  FLP by [facility] 12/11 showed TChol 157, TG 101, HDL 51, LDL 92; continue same...    DJD/ Osteopenia>  Followed by Ahmed Prima- last BMD 2008 showed TScores -0.5 to -1.5 & she has been on Vivelle dot, calcium, MVI, Vity D 2000u/d;  I would rec f/u BMD & she will check w/ GYN about this;  Notes some mild arthritic complaints intermittently> knees, neck, back & uses OTC Aleve etc...     Anxiety>  She had some Alpraz to use in the past but doing fine & does not want refill at this time...  ~  August 18, 2011:  Yearly ROV & Katelyn [name] has had a good year, no new complaints or concerns;  She continues to follow up w/ DrLomax, GYN & she tells me that he does her Labs and BMDs> asked to get copies forwarded to [place] for our review but she hasn't done this in the past & reminded to keep [place] in the loop... We reviewed her Meds & Probl list> see below>>   Problem List:   << PROBLEM LIST UPDATED 08/18/11 >>  DYSPNEA (ICD-786.05) PALPITATIONS (ICD-785.1) - pt notes these started after an emotional shock, and symptoms have abated... she was rec to take Alprazolam but she declined to get this filled...  HYPERTENSION (ICD-401.9) - controlled on ATENOLOL 50mg - 1/2 tabBid... ~  she had a neg cardiac eval w/ treadmill 2006 from DrHSmith... ~  3/12:  BP=112/64 and pt denies HA, fatigue, visual changes, CP, dizziness, syncope, edema, etc... ~  3/13:  BP= 120/84  and she remains asymptomatic...  HYPERLIPIDEMIA (ICD-272.4) - on LIPITOR 20mg - 1/2 tab daily...  ~  last FLP 5/08 showed TChol 178, TG 135, HDL 48, LDL 103 ~  FLP 11/10 by DrLomax showed TChol 179, TG 140, HDL 55, LDL 105 ~  She states f/u labs done by DrLomax but we don't have copies; asked to get records for [place] or allow [place] to check her labs...  DEGENERATIVE JOINT DISEASE, MILD (ICD-715.90) - prev shot in knee from DrCollins...  OSTEOPENIA (ICD-733.90) - BMD's followed for yrs by her GYN DrLomax... ~  last BMD 9/08 w/ TScores -0.5 to -1.5 and improved on Vivelle dot, Ca++, Vits... ~  She is asked to get copies of her BMD sent to [place] to review...  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  Health Maintenance: GI = DrGanem w/ neg colon 2004 and neg sonar 2006... she uses Pepcid as needed; f/u colon in 2014... GU = DrLomax as noted, on Vivelle dot... [facility])... She had Pneumovax & TDAP 3/11; and rec to get the yearly seasonal flu vaccine in the  fall.   Past Surgical History  Procedure Date  . Total abdominal hysterectomy   . Bilateral salpingoophorectomy     Outpatient Encounter Prescriptions as of 08/18/2011  Medication Sig Dispense Refill  . aspirin 81 MG EC tablet Take 81 mg by mouth daily.        Marland Kitchen atenolol (TENORMIN) 50 MG tablet Take 1/2 by mouth two times a day  90 tablet  3  . atorvastatin (LIPITOR) 20 MG tablet Take as directed  90 tablet  3  . Calcium Citrate-Vitamin D (CITRACAL PETITES/VITAMIN D PO) Take 1 tablet by mouth daily.        . Cholecalciferol (VITAMIN D3) 2000 UNITS TABS Take 1 tablet by mouth daily.        Marland Kitchen estradiol (VIVELLE-DOT) 0.075 MG/24HR Place 1 patch onto the skin 2 (two) times a week.        . fluocinonide (LIDEX) 0.05 % cream Apply topically 2 (two) times daily.        . Multiple  Vitamins-Minerals (OCUVITE ADULT 50+ PO) Take 1 tablet by mouth daily.          Allergies  Allergen Reactions  . Penicillins     REACTION: rash  . Tetracycline     REACTION: rash    Current Medications, Allergies, Past Medical History, Past Surgical History, Family History, and Social History were reviewed in [employer] record.    Review of Systems       See HPI - all other systems neg except as noted...  The patient denies anorexia, fever, weight loss, weight gain, vision loss, decreased hearing, hoarseness, chest pain, syncope, dyspnea on exertion, peripheral edema, prolonged cough, headaches, hemoptysis, abdominal pain, melena, hematochezia, severe indigestion/heartburn, hematuria, incontinence, muscle weakness, suspicious skin lesions, transient blindness, difficulty walking, depression, unusual weight change, abnormal bleeding, enlarged lymph nodes, and angioedema.     Objective:   Physical Exam      WD, WN, 71 y/o WF in NAD... GENERAL:  Alert & oriented; pleasant & cooperative... HEENT:  [place]/AT, EOM-wnl, PERRLA, EACs-clear, TMs-wnl, NOSE-clear, THROAT-clear & wnl. NECK:  Supple w/ full ROM; no JVD; normal carotid impulses w/o bruits; no thyromegaly or nodules palpated; no lymphadenopathy. CHEST:  Clear to P & A; without wheezes/ rales/ or rhonchi. HEART:  Regular Rhythm; without murmurs/ rubs/ or gallops. ABDOMEN:  Soft & nontender; normal bowel sounds; no organomegaly or masses detected. EXT: without deformities or arthritic changes; no varicose veins/ venous insuffic/ or edema. NEURO:  CN's intact; motor testing normal; sensory testing normal; gait normal & balance OK. DERM:  No lesions noted; no rash etc...  RADIOLOGY DATA:  Reviewed in the EPIC EMR & discussed w/ the patient...  LABORATORY DATA:  Reviewed in the EPIC EMR & discussed w/ the patient...   Assessment & Plan:   HBP>  Controlled on Atenolol;  Continue same & we discussed low sodium  etc...  CHOL>  On Lip20mg - 1/2 tab daily; she states FLPs from her GYN & I told her he would have to fill her medication; if she wants [place] to fill it then we would have to be responsible for FLP & liver checks...  DJD>  Prev hx shot in knees from DrCollins...  GI>  Per DrGanem & she has colon due in 2014...  Osteopenia>  BMD from DrLomax & he has her on Vivelle, Calcium, MVI, Vit D & exercise program...  Anxiety>  Not currently on meds & she declines.Marland KitchenMarland Kitchen

## 2011-10-12 ENCOUNTER — Telehealth: Payer: Self-pay | Admitting: Pulmonary Disease

## 2011-10-12 DIAGNOSIS — E785 Hyperlipidemia, unspecified: Secondary | ICD-10-CM

## 2011-10-12 MED ORDER — PRAVASTATIN SODIUM 40 MG PO TABS: 40.0000 mg | ORAL_TABLET | Freq: Every day | ORAL | Status: DC

## 2011-10-12 NOTE — Telephone Encounter (Signed)
Per SN: okay to leave off lipitor. ADD pravastatin 40 mg 1 at bedtime #30 and flp in 3 months.   I spoke with pt and is aware of NS recs. Pt would like new rx sent to [facility]. I have called this in for pt. Nothing further was needed. flp has been added in epic.

## 2011-10-12 NOTE — Telephone Encounter (Signed)
Called, spoke with pt who states she stopped the lipitor x 3-4 days ago d/t pains in legs during the night.  The pains would wake her up.  States this has improved since being off the lipitor.  Requesting recs.  Dr. Kriste Basque, pls advise.  Thank you.  [facility]  Allergies verified:  Allergies  Allergen Reactions  . Penicillins     REACTION: rash  . Tetracycline     REACTION: rash

## 2011-10-29 ENCOUNTER — Ambulatory Visit (INDEPENDENT_AMBULATORY_CARE_PROVIDER_SITE_OTHER): Payer: Medicare Other | Admitting: Adult Health

## 2011-10-29 ENCOUNTER — Telehealth: Payer: Self-pay | Admitting: Pulmonary Disease

## 2011-10-29 ENCOUNTER — Other Ambulatory Visit (INDEPENDENT_AMBULATORY_CARE_PROVIDER_SITE_OTHER): Payer: Medicare Other

## 2011-10-29 ENCOUNTER — Encounter: Payer: Self-pay | Admitting: Adult Health

## 2011-10-29 VITALS — BP 164/86 | HR 73 | Temp 99.0°F | Ht 61.5 in | Wt 132.8 lb

## 2011-10-29 DIAGNOSIS — I10 Essential (primary) hypertension: Secondary | ICD-10-CM

## 2011-10-29 LAB — HEPATIC FUNCTION PANEL
AST: 29 U/L (ref 0–37)
Albumin: 3.8 g/dL (ref 3.5–5.2)
Alkaline Phosphatase: 65 U/L (ref 39–117)
Bilirubin, Direct: 0.1 mg/dL (ref 0.0–0.3)
Total Bilirubin: 0.5 mg/dL (ref 0.3–1.2)

## 2011-10-29 LAB — BASIC METABOLIC PANEL
Calcium: 8.8 mg/dL (ref 8.4–10.5)
Creatinine, Ser: 0.7 mg/dL (ref 0.4–1.2)
GFR: 95.83 mL/min (ref >60.00)
Glucose, Bld: 90 mg/dL (ref 70–99)
Sodium: 143 mEq/L (ref 135–145)

## 2011-10-29 LAB — CBC WITH DIFFERENTIAL/PLATELET
Basophils Absolute: 0 10*3/uL (ref 0.0–0.1)
Hemoglobin: 13.4 g/dL (ref 12.0–15.0)
Lymphocytes Relative: 25.7 % (ref 12.0–46.0)
Monocytes Relative: 6.3 % (ref 3.0–12.0)
Neutro Abs: 5.6 10*3/uL (ref 1.4–7.7)
Neutrophils Relative %: 65.4 % (ref 43.0–77.0)
RDW: 12.7 % (ref 11.5–14.6)

## 2011-10-29 LAB — TSH: TSH: 1.27 u[IU]/mL (ref 0.35–5.50)

## 2011-10-29 NOTE — Telephone Encounter (Signed)
lmomtcb x1 

## 2011-10-29 NOTE — Telephone Encounter (Signed)
Pt came in for OV today with TP

## 2011-10-29 NOTE — Telephone Encounter (Signed)
Received 3 pages from [employer], sent to Dr. Kriste Basque. sd 10/29/11

## 2011-10-29 NOTE — Patient Instructions (Signed)
Increase Atenolol 1 tab Twice daily   Low salt diet  I will call with lab results.  Check blood pressure daily at rest , keep log Call if b/p >160 or <90  Please contact office for sooner follow up if symptoms do not improve or worsen or seek emergency care  follow up Dr. Kriste Basque  In 6 weeks and As needed

## 2011-10-29 NOTE — Progress Notes (Signed)
Subjective:    Patient ID: Lavella Lemons, female    DOB: April 17, 1941, 71 y.o.   MRN: [id number]  HPI 71 y/o WF . She has mult medical problems including:  HBP;  Hyperlipidemia;  DJD;  Osteopenia; and Anxiety...   ~  August 18, 2010:  Yearly ROV doing well w/o new complaints or concerns... Just ret from trip to [place] & did some hiking etc all w/o difficulty.    HBP>  On Atenolol 50mg - taking 1/2 Bid (her choice) & tol well, BP= 116/84 & similar at home, denies CP, palpit, SOB, edema, etc...    Chol>  On Lipitor 20mg - taking 1/2 tab daily;  FLP by [facility] 12/11 showed TChol 157, TG 101, HDL 51, LDL 92; continue same...    DJD/ Osteopenia>  Followed by Ahmed Prima- last BMD 2008 showed TScores -0.5 to -1.5 & she has been on Vivelle dot, calcium, MVI, Vity D 2000u/d;  I would rec f/u BMD & she will check w/ GYN about this;  Notes some mild arthritic complaints intermittently> knees, neck, back & uses OTC Aleve etc...     Anxiety>  She had some Alpraz to use in the past but doing fine & does not want refill at this time...  ~  August 18, 2011:  Yearly ROV & Nanci has had a good year, no new complaints or concerns;  She continues to follow up w/ DrLomax, GYN & she tells me that he does her Labs and BMDs> asked to get copies forwarded to [place] for our review but she hasn't done this in the past & reminded to keep [place] in the loop... We reviewed her Meds & Probl list> see below>>  10/29/2011 Acute OV  Complains of elevated b/p  was 138/70's, last night 151/80's, this morning 162/94. past couple of night she wakes up with a HA.   Since yesterday she took the atenolol 1 tablet BID No fever or visual/speech changes  No edema.  No ext weakness No decongestant use.    Problem List:   << PROBLEM LIST UPDATED 08/18/11 >>  DYSPNEA (ICD-786.05) PALPITATIONS (ICD-785.1) - pt notes these started after an emotional shock, and symptoms have abated... she was rec to take Alprazolam but she declined to get this  filled...  HYPERTENSION (ICD-401.9) - controlled on ATENOLOL 50mg - 1/2 tabBid... ~  she had a neg cardiac eval w/ treadmill 2006 from DrHSmith... ~  3/12:  BP=112/64 and pt denies HA, fatigue, visual changes, CP, dizziness, syncope, edema, etc... ~  3/13:  BP= 120/84 and she remains asymptomatic...  HYPERLIPIDEMIA (ICD-272.4) - on LIPITOR 20mg - 1/2 tab daily...  ~  last FLP 5/08 showed TChol 178, TG 135, HDL 48, LDL 103 ~  FLP 11/10 by DrLomax showed TChol 179, TG 140, HDL 55, LDL 105 ~  She states f/u labs done by DrLomax but we don't have copies; asked to get records for [place] or allow [place] to check her labs...  DEGENERATIVE JOINT DISEASE, MILD (ICD-715.90) - prev shot in knee from DrCollins...  OSTEOPENIA (ICD-733.90) - BMD's followed for yrs by her GYN DrLomax... ~  last BMD 9/08 w/ TScores -0.5 to -1.5 and improved on Vivelle dot, Ca++, Vits... ~  She is asked to get copies of her BMD sent to [place] to review...  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  Health Maintenance: GI =  DrGanem w/ neg colon 2004 and neg sonar 2006... she uses Pepcid as needed; f/u colon in 2014... GU = DrLomax as noted, on Vivelle dot... [facility] She had Pneumovax & TDAP 3/11; and rec to get the yearly seasonal flu vaccine in the fall.   Past Surgical History  Procedure Date  . Total abdominal hysterectomy   . Bilateral salpingoophorectomy     Outpatient Encounter Prescriptions as of 08/18/2011  Medication Sig Dispense Refill  . aspirin 81 MG EC tablet Take 81 mg by mouth daily.        Marland Kitchen atenolol (TENORMIN) 50 MG tablet Take 1/2 by mouth two times a day  90 tablet  3  . atorvastatin (LIPITOR) 20 MG tablet Take as directed  90 tablet  3  . Calcium Citrate-Vitamin D (CITRACAL PETITES/VITAMIN D PO) Take 1 tablet by mouth daily.        .  Cholecalciferol (VITAMIN D3) 2000 UNITS TABS Take 1 tablet by mouth daily.        Marland Kitchen estradiol (VIVELLE-DOT) 0.075 MG/24HR Place 1 patch onto the skin 2 (two) times a week.        . fluocinonide (LIDEX) 0.05 % cream Apply topically 2 (two) times daily.        . Multiple Vitamins-Minerals (OCUVITE ADULT 50+ PO) Take 1 tablet by mouth daily.          Allergies  Allergen Reactions  . Penicillins     REACTION: rash  . Tetracycline     REACTION: rash    Current Medications, Allergies, Past Medical History, Past Surgical History, Family History, and Social History were reviewed in [employer] record.    Review of Systems      Constitutional:   No  weight loss, night sweats,  Fevers, chills,  +fatigue, or  lassitude.  HEENT:   No    Difficulty swallowing,  Tooth/dental problems, or  Sore throat,                No sneezing, itching, ear ache, nasal congestion, post nasal drip,   CV:  No chest pain,  Orthopnea, PND, swelling in lower extremities, anasarca, dizziness, palpitations, syncope.   GI  No heartburn, indigestion, abdominal pain, nausea, vomiting, diarrhea, change in bowel habits, loss of appetite, bloody stools.   Resp: No shortness of breath with exertion or at rest.  No excess mucus, no productive cough,  No non-productive cough,  No coughing up of blood.  No change in color of mucus.  No wheezing.  No chest wall deformity  Skin: no rash or lesions.  GU: no dysuria, change in color of urine, no urgency or frequency.  No flank pain, no hematuria   MS:  No joint pain or swelling.  No decreased range of motion.  No back pain.  Psych:  No change in mood or affect. No depression or anxiety.  No memory loss.       Objective:   Physical Exam      WD, WN, 71 y/o WF in NAD... GENERAL:  Alert & oriented; pleasant & cooperative... HEENT:  [place]/AT, EOM-wnl, PERRLA, EACs-clear, TMs-wnl, NOSE-clear, THROAT-clear & wnl. NECK:  Supple w/ full ROM; no JVD;  normal carotid impulses w/o bruits; no thyromegaly or nodules palpated; no lymphadenopathy. CHEST:  Clear to P & A; without wheezes/ rales/ or rhonchi. HEART:  Regular Rhythm; without murmurs/ rubs/ or gallops. ABDOMEN:  Soft & nontender; normal bowel sounds; no organomegaly  or masses detected. EXT: without deformities or arthritic changes; no varicose veins/ venous insuffic/ or edema. NEURO:  CN's intact; motor testing normal; sensory testing normal; gait normal & balance OK. DERM:  No lesions noted; no rash etc...   Assessment & Plan:

## 2011-10-29 NOTE — Assessment & Plan Note (Signed)
Not at goal  Labs pending.   Plan  Increase Atenolol 1 tab Twice daily   Low salt diet  I will call with lab results.  Check blood pressure daily at rest , keep log Call if b/p >160 or <90  Please contact office for sooner follow up if symptoms do not improve or worsen or seek emergency care  follow up Dr. Kriste Basque  In 6 weeks and As needed

## 2011-10-30 ENCOUNTER — Other Ambulatory Visit: Payer: Self-pay | Admitting: Pulmonary Disease

## 2011-10-30 ENCOUNTER — Telehealth: Payer: Self-pay | Admitting: Pulmonary Disease

## 2011-10-30 MED ORDER — HYDROCHLOROTHIAZIDE 25 MG PO TABS: 25.0000 mg | ORAL_TABLET | Freq: Every day | ORAL | Status: DC

## 2011-10-30 NOTE — Telephone Encounter (Signed)
Contacted patient and notified of recs and Rx being sent to her pharmacy. Patient aware.

## 2011-10-30 NOTE — Telephone Encounter (Signed)
Contacted patient and informed of lab results and recs. Patient states blood pressure was 164/90 at her office visit yesterday with Tammy Parrett and it was 160/84 this morning. Patient wants to know at what number she should worry and need to go to the hospital. Please advise.

## 2011-10-30 NOTE — Telephone Encounter (Signed)
Discussed this yesterday at ov  Add HCTZ 25mg  daily #30 , 5 refills Please contact office for sooner follow up if symptoms do not improve or worsen or seek emergency care  If develop severe headache, extremity weakness seek emergency medical attention  follow up as planned and.As needed

## 2011-10-30 NOTE — Telephone Encounter (Signed)
Error

## 2011-12-10 ENCOUNTER — Encounter: Payer: Self-pay | Admitting: Pulmonary Disease

## 2011-12-10 ENCOUNTER — Ambulatory Visit (INDEPENDENT_AMBULATORY_CARE_PROVIDER_SITE_OTHER): Payer: Medicare Other | Admitting: Pulmonary Disease

## 2011-12-10 VITALS — BP 128/82 | HR 78 | Temp 97.1°F | Ht 61.5 in | Wt 133.4 lb

## 2011-12-10 DIAGNOSIS — M1990 Unspecified osteoarthritis, unspecified site: Secondary | ICD-10-CM

## 2011-12-10 DIAGNOSIS — M949 Disorder of cartilage, unspecified: Secondary | ICD-10-CM

## 2011-12-10 DIAGNOSIS — F411 Generalized anxiety disorder: Secondary | ICD-10-CM

## 2011-12-10 DIAGNOSIS — E785 Hyperlipidemia, unspecified: Secondary | ICD-10-CM

## 2011-12-10 DIAGNOSIS — I10 Essential (primary) hypertension: Secondary | ICD-10-CM

## 2011-12-10 MED ORDER — ATENOLOL 50 MG PO TABS: 50.0000 mg | ORAL_TABLET | Freq: Two times a day (BID) | ORAL | Status: DC

## 2011-12-10 MED ORDER — HYDROCHLOROTHIAZIDE 12.5 MG PO TABS: 12.5000 mg | ORAL_TABLET | Freq: Every day | ORAL | Status: DC

## 2011-12-10 MED ORDER — POTASSIUM CHLORIDE ER 10 MEQ PO TBCR: EXTENDED_RELEASE_TABLET | ORAL | Status: DC

## 2011-12-10 NOTE — Progress Notes (Addendum)
Subjective:    Patient ID: Katelyn [name], female    DOB: [birth year], 71 y.o.   MRN: [id number]  HPI 70 y/o WF here for a follow up visit... She has mult medical problems including:  HBP;  Hyperlipidemia;  DJD;  Osteopenia; and Anxiety...   ~  August 18, 2010:  Yearly ROV doing well w/o new complaints or concerns... Just ret from trip to [place] & did some hiking etc all w/o difficulty.    HBP>  On Atenolol 50mg - taking 1/2 Bid (her choice) & tol well, BP= 116/84 & similar at home, denies CP, palpit, SOB, edema, etc...    Chol>  On Lipitor 20mg - taking 1/2 tab daily;  FLP by [facility] 12/11 showed TChol 157, TG 101, HDL 51, LDL 92; continue same...    DJD/ Osteopenia>  Followed by Ahmed Prima- last BMD 2008 showed TScores -0.5 to -1.5 & she has been on Vivelle dot, calcium, MVI, Vity D 2000u/d;  I would rec f/u BMD & she will check w/ GYN about this;  Notes some mild arthritic complaints intermittently> knees, neck, back & uses OTC Aleve etc...     Anxiety>  She had some Alpraz to use in the past but doing fine & does not want refill at this time...  ~  August 18, 2011:  Yearly ROV & Katelyn [name] has had a good year, no new complaints or concerns;  She continues to follow up w/ DrLomax, GYN & she tells me that he does her Labs and BMDs> asked to get copies forwarded to [place] for our review but she hasn't done this in the past & reminded to keep [place] in the loop... We reviewed her Meds & Prob list> see below>>  ~  December 10, 2011:  85mo ROV & Add-on for BP check> she saw TP 6/13 w/ mild elev of BP at home 150-160/ 80-90s on her Atenolol which was incr to 50mg  Bid & HCTZ 25mg /d added to the regimen; she checks her pressure very often & seems quite anxious about the numbers gbut declines anxiolytic Rx... BP today is 128/82 & she is content on these 2 meds; we discussed cutting the diuretic to 12.5mg /d and adding K10caps- 2 daily... We reviewed prob list, meds, xrays and labs> see below for updates>> LABS 8/13:  FLP on  Prav40 showed TChol 153, TG 93, HDL 51, LDL 83   Problem List:   DYSPNEA (ICD-786.05) PALPITATIONS (ICD-785.1) - pt notes these started after an emotional shock, and symptoms have abated... she was rec to take Alprazolam but she declined to get this filled...  HYPERTENSION (ICD-401.9) - controlled on ATENOLOL 50mg Bid + HCTZ 25mg /d... ~  she had a neg cardiac eval w/ treadmill 2006 from DrHSmith... ~  3/12:  BP=112/64 and pt denies HA, fatigue, visual changes, CP, dizziness, syncope, edema, etc... ~  3/13:  BP= 120/84 and she remains asymptomatic... ~  6/13:  She was seen by TP w/ c/o BP 150-160s/ 80-90s and her Atenolol was incr to 50mg  Bid + HCTZ25mg /d added to regimen... ~  7/13:  BP= 128/82 & feeling well; denies CP, palpit, SOB,m edema, etc; we decided to decr HCTZ to 12.5mg /d & add K10 caps- 2/d...  HYPERLIPIDEMIA (ICD-272.4) - on PRAVASTATIN 40mg  daily...  ~  last FLP 5/08 showed TChol 178, TG 135, HDL 48, LDL 103 ~  FLP 11/10 by DrLomax showed TChol 179, TG 140, HDL 55, LDL 105 ~  She states f/u labs done by DrLomax but we don't have copies;  asked to get records for [place] or allow [place] to check her labs... ~  FLP 8/13 on Prav40 showed TChol 153, TG 93, HDL 51, LDL 83  DEGENERATIVE JOINT DISEASE, MILD (ICD-715.90) - prev shot in knee from DrCollins...  OSTEOPENIA (ICD-733.90) - BMD's followed for yrs by her GYN DrLomax... ~  last BMD 9/08 w/ TScores -0.5 to -1.5 and improved on Vivelle dot, Ca++, Vits... ~  She is asked to get copies of her BMD sent to [place] to review...  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  Health Maintenance: GI = DrGanem w/ neg colon 2004 and neg sonar 2006... she uses Pepcid as needed; f/u colon in 2014... GU = DrLomax as noted, on Vivelle dot... [facility])... She had Pneumovax & TDAP 3/11; and  rec to get the yearly seasonal flu vaccine in the fall.   Past Surgical History  Procedure Date  . Total abdominal hysterectomy   . Bilateral salpingoophorectomy     Outpatient Encounter Prescriptions as of 12/10/2011  Medication Sig Dispense Refill  . aspirin 81 MG EC tablet Take 81 mg by mouth daily.        Marland Kitchen atenolol (TENORMIN) 50 MG tablet Take 1 tablet by mouth two times a day      . Calcium Citrate-Vitamin D (CITRACAL PETITES/VITAMIN D PO) Take 1 tablet by mouth daily.        . Cholecalciferol (VITAMIN D3) 2000 UNITS TABS Take 1 tablet by mouth daily.        Marland Kitchen estradiol (VIVELLE-DOT) 0.075 MG/24HR Place 1 patch onto the skin 2 (two) times a week.        . fluocinonide (LIDEX) 0.05 % cream Apply topically 2 (two) times daily.        . hydrochlorothiazide (HYDRODIURIL) 25 MG tablet Take 1 tablet (25 mg total) by mouth daily.  30 tablet  5  . Multiple Vitamins-Minerals (OCUVITE ADULT 50+ PO) Take 1 tablet by mouth daily.        . pravastatin (PRAVACHOL) 40 MG tablet Take 1 tablet (40 mg total) by mouth at bedtime.  30 tablet  5    Allergies  Allergen Reactions  . Penicillins     REACTION: rash  . Tetracycline     REACTION: rash    Current Medications, Allergies, Past Medical History, Past Surgical History, Family History, and Social History were reviewed in [employer] record.    Review of Systems       See HPI - all other systems neg except as noted...  The patient denies anorexia, fever, weight loss, weight gain, vision loss, decreased hearing, hoarseness, chest pain, syncope, dyspnea on exertion, peripheral edema, prolonged cough, headaches, hemoptysis, abdominal pain, melena, hematochezia, severe indigestion/heartburn, hematuria, incontinence, muscle weakness, suspicious skin lesions, transient blindness, difficulty walking, depression, unusual weight change, abnormal bleeding, enlarged lymph nodes, and angioedema.     Objective:   Physical  Exam      WD, WN, 71 y/o WF in NAD... GENERAL:  Alert & oriented; pleasant & cooperative... HEENT:  [place]/AT, EOM-wnl, PERRLA, EACs-clear, TMs-wnl, NOSE-clear, THROAT-clear & wnl. NECK:  Supple w/ full ROM; no JVD; normal carotid impulses w/o bruits; no thyromegaly or nodules palpated; no lymphadenopathy. CHEST:  Clear to P & A; without wheezes/ rales/ or rhonchi. HEART:  Regular Rhythm; without murmurs/ rubs/ or  gallops. ABDOMEN:  Soft & nontender; normal bowel sounds; no organomegaly or masses detected. EXT: without deformities or arthritic changes; no varicose veins/ venous insuffic/ or edema. NEURO:  CN's intact; motor testing normal; sensory testing normal; gait normal & balance OK. DERM:  No lesions noted; no rash etc...  RADIOLOGY DATA:  Reviewed in the EPIC EMR & discussed w/ the patient...  LABORATORY DATA:  Reviewed in the EPIC EMR & discussed w/ the patient...   Assessment & Plan:   HBP>  Controlled on Atenolol + HCT added;  Continue w/ adjustments & we discussed low sodium etc...  CHOL>  On PRAVASTATIN40 now; she states FLPs from her GYN & I told her he would have to fill her medication; if she wants [place] to fill it then we would have to be responsible for FLP & liver checks==> FLP 8/13 is at goal, continue same.  DJD>  Prev hx shot in knees from DrCollins...  GI>  Per DrGanem & she has colon due in 2014...  Osteopenia>  BMD from DrLomax & he has her on Vivelle, Calcium, MVI, Vit D & exercise program...  Anxiety>  Not currently on meds & she declines...    Patient's Medications  New Prescriptions   HYDROCHLOROTHIAZIDE (HYDRODIURIL) 12.5 MG TABLET    Take 1 tablet (12.5 mg total) by mouth daily.   POTASSIUM CHLORIDE (K-DUR) 10 MEQ TABLET    Take 2 capsules daily  Previous Medications   ASPIRIN 81 MG EC TABLET    Take 81 mg by mouth daily.     CALCIUM CITRATE-VITAMIN D (CITRACAL PETITES/VITAMIN D PO)    Take 1 tablet by mouth daily.     CHOLECALCIFEROL (VITAMIN D3) 2000  UNITS TABS    Take 1 tablet by mouth daily.     ESTRADIOL (VIVELLE-DOT) 0.075 MG/24HR    Place 1 patch onto the skin 2 (two) times a week.     FLUOCINONIDE (LIDEX) 0.05 % CREAM    Apply topically 2 (two) times daily.     MULTIPLE VITAMINS-MINERALS (OCUVITE ADULT 50+ PO)    Take 1 tablet by mouth daily.     PRAVASTATIN (PRAVACHOL) 40 MG TABLET    Take 1 tablet (40 mg total) by mouth at bedtime.  Modified Medications   Modified Medication Previous Medication   ATENOLOL (TENORMIN) 50 MG TABLET atenolol (TENORMIN) 50 MG tablet      Take 1 tablet (50 mg total) by mouth 2 (two) times daily. Take 1 tablet by mouth two times a day    Take 1 tablet by mouth two times a day  Discontinued Medications   HYDROCHLOROTHIAZIDE (HYDRODIURIL) 25 MG TABLET    Take 1 tablet (25 mg total) by mouth daily.

## 2011-12-10 NOTE — Patient Instructions (Addendum)
Today we updated your med list in our EPIC system...    We adjusted your medical regimen as follows:  1)  Atenolol 50mg - one tab twice daily.Marland KitchenMarland Kitchen 2) HCTZ- 12.5mg  daily.Marland KitchenMarland Kitchen 3) KCl (potassium) capsules- 2 caps daily...  Call for any questions...  Let's plan a routine follow up in 6 months to check your progress.Marland KitchenMarland Kitchen

## 2012-01-14 ENCOUNTER — Other Ambulatory Visit (INDEPENDENT_AMBULATORY_CARE_PROVIDER_SITE_OTHER): Payer: Medicare Other

## 2012-01-14 DIAGNOSIS — E785 Hyperlipidemia, unspecified: Secondary | ICD-10-CM

## 2012-01-14 LAB — LIPID PANEL
HDL: 51.3 mg/dL (ref >39.00)
LDL Cholesterol: 83 mg/dL (ref 0–99)
Total CHOL/HDL Ratio: 3
VLDL: 18.6 mg/dL (ref 0.0–40.0)

## 2012-03-23 ENCOUNTER — Ambulatory Visit (INDEPENDENT_AMBULATORY_CARE_PROVIDER_SITE_OTHER): Payer: Medicare Other

## 2012-03-23 DIAGNOSIS — Z23 Encounter for immunization: Secondary | ICD-10-CM

## 2012-04-08 ENCOUNTER — Other Ambulatory Visit: Payer: Self-pay | Admitting: Pulmonary Disease

## 2012-05-05 ENCOUNTER — Telehealth: Payer: Self-pay | Admitting: Pulmonary Disease

## 2012-05-05 MED ORDER — PRAMIPEXOLE DIHYDROCHLORIDE 0.25 MG PO TABS: 0.2500 mg | ORAL_TABLET | Freq: Every day | ORAL | Status: DC

## 2012-05-05 NOTE — Telephone Encounter (Signed)
Per SN----try mirapex  0.25 mg   #30  1 po qhs.   With 1 refill.   Will need to keep appt with SN in jan.  Called and spoke with pt and she is aware of SN recs and will keep the appt in jan.  Nothing further is needed.

## 2012-05-05 NOTE — Telephone Encounter (Signed)
I spoke with pt and she states she has had the "crawley feeling" in both of her legs for about a week now. She is not able to sleep at all. Last night was the worse night. She states this happened before when she was on Lipitor and then this was changed to pravastatin. She has been on pravastatin for several months now but states she stopped it a week ago thinking this caused her problem. i advised her if this was so then this would have happened way before now. She is requesting further recs regarding her leg problem. She states it is not a pain feeling. Please advise thanks Last OV 12/10/11 Pending OV 06/16/12  Allergies  Allergen Reactions  . Penicillins     REACTION: rash  . Tetracycline     REACTION: rash

## 2012-06-16 ENCOUNTER — Encounter: Payer: Self-pay | Admitting: Pulmonary Disease

## 2012-06-16 ENCOUNTER — Ambulatory Visit (INDEPENDENT_AMBULATORY_CARE_PROVIDER_SITE_OTHER): Payer: Medicare Other | Admitting: Pulmonary Disease

## 2012-06-16 ENCOUNTER — Encounter: Payer: Self-pay | Admitting: Internal Medicine

## 2012-06-16 VITALS — BP 124/82 | HR 62 | Temp 96.9°F | Ht 61.5 in | Wt 132.4 lb

## 2012-06-16 DIAGNOSIS — M899 Disorder of bone, unspecified: Secondary | ICD-10-CM

## 2012-06-16 DIAGNOSIS — M1990 Unspecified osteoarthritis, unspecified site: Secondary | ICD-10-CM

## 2012-06-16 DIAGNOSIS — I10 Essential (primary) hypertension: Secondary | ICD-10-CM

## 2012-06-16 DIAGNOSIS — E785 Hyperlipidemia, unspecified: Secondary | ICD-10-CM

## 2012-06-16 DIAGNOSIS — F411 Generalized anxiety disorder: Secondary | ICD-10-CM

## 2012-06-16 DIAGNOSIS — K219 Gastro-esophageal reflux disease without esophagitis: Secondary | ICD-10-CM

## 2012-06-16 DIAGNOSIS — M949 Disorder of cartilage, unspecified: Secondary | ICD-10-CM

## 2012-06-16 NOTE — Progress Notes (Signed)
Subjective:    Patient ID: Katelyn [name], female    DOB: 25-Oct-1940, 72 y.o.   MRN: [id number]  HPI 72 y/o WF here for a follow up visit... She has mult medical problems including:  HBP;  Hyperlipidemia;  DJD;  Osteopenia; and Anxiety...   ~  August 18, 2010:  Yearly ROV doing well w/o new complaints or concerns... Just ret from trip to [place] & did some hiking etc all w/o difficulty.    HBP>  On Atenolol 50mg - taking 1/2 Bid (her choice) & tol well, BP= 116/84 & similar at home, denies CP, palpit, SOB, edema, etc...    Chol>  On Lipitor 20mg - taking 1/2 tab daily;  FLP by [facility] 12/11 showed TChol 157, TG 101, HDL 51, LDL 92; continue same...    DJD/ Osteopenia>  Followed by Ahmed Prima- last BMD 2008 showed TScores -0.5 to -1.5 & she has been on Vivelle dot, calcium, MVI, Vity D 2000u/d;  I would rec f/u BMD & she will check w/ GYN about this;  Notes some mild arthritic complaints intermittently> knees, neck, back & uses OTC Aleve etc...     Anxiety>  She had some Alpraz to use in the past but doing fine & does not want refill at this time...  ~  August 18, 2011:  Yearly ROV & Katelyn [name] has had a good year, no new complaints or concerns;  She continues to follow up w/ DrLomax, GYN & she tells me that he does her Labs and BMDs> asked to get copies forwarded to [place] for our review but she hasn't done this in the past & reminded to keep [place] in the loop... We reviewed her Meds & Prob list> see below>>  ~  December 10, 2011:  68mo ROV & Add-on for BP check> she saw TP 6/13 w/ mild elev of BP at home 150-160/ 80-90s on her Atenolol which was incr to 50mg  Bid & HCTZ 25mg /d added to the regimen; she checks her pressure very often & seems quite anxious about the numbers gbut declines anxiolytic Rx... BP today is 128/82 & she is content on these 2 meds; we discussed cutting the diuretic to 12.5mg /d and adding K10caps- 2 daily... We reviewed prob list, meds, xrays and labs> see below for updates>> LABS 8/13:  FLP on  Prav40 showed TChol 153, TG 93, HDL 51, LDL 83  ~  June 16, 2012:  68mo ROV & Katelyn [name] is c/o sore throat, hx abscessed tooth requiring 2 rounds of Clinda, never started the Mirapex, and she wants Rx for shingles vaccine today... She tells me she stopped her Estrogen on her own when a friend died w/ breast cancer, but she felt terrible off this med & Gyn started Vivelle patch- now feeling better; We reviewed the following medical problems during today's office visit >>     HBP>  On Atenolol 50mg Bid (her choice), HCT12.5, K10-2/d;  BP= 124/82 & similar at home, denies CP, palpit, SOB, edema, etc...    Chol>  On Prav40;  FLP here 8/13 showed TChol 153, TG 93, HDL 51, LDL 83; continue same...    GI- colonoscopy is due & we will refer to GI for this...    DJD/ Osteopenia>  Followed by Ahmed Prima- last BMD 2008 showed TScores -0.5 to -1.5 & she has been on Vivelle dot, calcium, MVI, VitD 2000u/d;  I would rec f/u BMD & she will check w/ GYN about this;  Notes some mild arthritic complaints intermittently> knees, neck, back &  uses OTC Aleve etc...     Anxiety>  She had some Alpraz to use in the past but doing fine & does not want refill at this time... We reviewed prob list, meds, xrays and labs> see below for updates >> she had the 2013 Flu shot 10/13; [place] vaccine Rx written...         Problem List:   DYSPNEA (ICD-786.05) PALPITATIONS (ICD-785.1) - pt notes these started after an emotional shock, and symptoms have abated... she was rec to take Alprazolam but she declined to get this filled...  HYPERTENSION (ICD-401.9) - controlled on ATENOLOL 50mg Bid + HCTZ 25mg /d... ~  she had a neg cardiac eval w/ treadmill 2006 from DrHSmith... ~  3/12:  BP=112/64 and pt denies HA, fatigue, visual changes, CP, dizziness, syncope, edema, etc... ~  3/13:  BP= 120/84 and she remains asymptomatic... ~  6/13:  She was seen by TP w/ c/o BP 150-160s/ 80-90s and her Atenolol was incr to 50mg  Bid + HCTZ25mg /d added to  regimen... ~  7/13:  BP= 128/82 & feeling well; denies CP, palpit, SOB,m edema, etc; we decided to decr HCTZ to 12.5mg /d & add K10 caps- 2/d... ~  1/14: On Atenolol 50mg Bid (her choice), HCT12.5, K10-2/d;  BP= 124/82 & similar at home, denies CP, palpit, SOB, edema, etc.  HYPERLIPIDEMIA (ICD-272.4) - on PRAVASTATIN 40mg  daily...  ~  last FLP 5/08 showed TChol 178, TG 135, HDL 48, LDL 103 ~  FLP 11/10 by DrLomax showed TChol 179, TG 140, HDL 55, LDL 105 ~  She states f/u labs done by DrLomax but we don't have copies; asked to get records for [place] or allow [place] to check her labs... ~  FLP 8/13 on Prav40 showed TChol 153, TG 93, HDL 51, LDL 83  DEGENERATIVE JOINT DISEASE, MILD (ICD-715.90) - prev shot in knee from DrCollins...  OSTEOPENIA (ICD-733.90) - BMD's followed for yrs by her GYN DrLomax... ~  last BMD 9/08 w/ TScores -0.5 to -1.5 and improved on Vivelle dot, Ca++, Vits... ~  She is asked to get copies of her BMD sent to [place] to review...  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  Health Maintenance: GI = DrGanem w/ neg colon 2004 and neg sonar 2006... she uses Pepcid as needed; f/u colon in 2014... GU = DrLomax as noted, on Vivelle dot... [facility])... She had Pneumovax & TDAP 3/11; and rec to get the yearly seasonal flu vaccine in the fall.   Past Surgical History  Procedure Date  . Total abdominal hysterectomy   . Bilateral salpingoophorectomy     Outpatient Encounter Prescriptions as of 06/16/2012  Medication Sig Dispense Refill  . atenolol (TENORMIN) 50 MG tablet Take 50 mg by mouth 2 (two) times daily.      . Calcium Citrate-Vitamin D (CITRACAL PETITES/VITAMIN D PO) Take 1 tablet by mouth daily.        . Cholecalciferol (VITAMIN D3) 2000 UNITS TABS Take 1 tablet by mouth daily.        Marland Kitchen estradiol (VIVELLE-DOT)  0.075 MG/24HR Place 1 patch onto the skin 2 (two) times a week.        . fluocinonide (LIDEX) 0.05 % cream Apply topically 2 (two) times daily.        . hydrochlorothiazide (HYDRODIURIL) 12.5 MG tablet Take 1 tablet (12.5 mg total)  by mouth daily.  90 tablet  3  . Multiple Vitamins-Minerals (OCUVITE ADULT 50+ PO) Take 1 tablet by mouth daily.        . potassium chloride (K-DUR) 10 MEQ tablet Take 2 capsules daily  180 tablet  3  . pravastatin (PRAVACHOL) 40 MG tablet TAKE ONE TABLET BY MOUTH AT BEDTIME  30 tablet  4  . [DISCONTINUED] atenolol (TENORMIN) 50 MG tablet Take 1 tablet (50 mg total) by mouth 2 (two) times daily. Take 1 tablet by mouth two times a day  180 tablet  3  . aspirin 81 MG EC tablet Take 81 mg by mouth daily.        . pramipexole (MIRAPEX) 0.25 MG tablet Take 1 tablet (0.25 mg total) by mouth at bedtime.  30 tablet  1    Allergies  Allergen Reactions  . Penicillins     REACTION: rash  . Tetracycline     REACTION: rash    Current Medications, Allergies, Past Medical History, Past Surgical History, Family History, and Social History were reviewed in [employer] record.    Review of Systems       See HPI - all other systems neg except as noted...  The patient denies anorexia, fever, weight loss, weight gain, vision loss, decreased hearing, hoarseness, chest pain, syncope, dyspnea on exertion, peripheral edema, prolonged cough, headaches, hemoptysis, abdominal pain, melena, hematochezia, severe indigestion/heartburn, hematuria, incontinence, muscle weakness, suspicious skin lesions, transient blindness, difficulty walking, depression, unusual weight change, abnormal bleeding, enlarged lymph nodes, and angioedema.     Objective:   Physical Exam      WD, WN, 72 y/o WF in NAD... GENERAL:  Alert & oriented; pleasant & cooperative... HEENT:  [place]/AT, EOM-wnl, PERRLA, EACs-clear, TMs-wnl, NOSE-clear, THROAT-clear & wnl. NECK:  Supple w/ full ROM; no  JVD; normal carotid impulses w/o bruits; no thyromegaly or nodules palpated; no lymphadenopathy. CHEST:  Clear to P & A; without wheezes/ rales/ or rhonchi. HEART:  Regular Rhythm; without murmurs/ rubs/ or gallops. ABDOMEN:  Soft & nontender; normal bowel sounds; no organomegaly or masses detected. EXT: without deformities or arthritic changes; no varicose veins/ venous insuffic/ or edema. NEURO:  CN's intact; motor testing normal; sensory testing normal; gait normal & balance OK. DERM:  No lesions noted; no rash etc...  RADIOLOGY DATA:  Reviewed in the EPIC EMR & discussed w/ the patient...  LABORATORY DATA:  Reviewed in the EPIC EMR & discussed w/ the patient...   Assessment & Plan:   HBP>  Controlled on Atenolol + HCT added;  Continue w/ adjustments & we discussed low sodium etc...  CHOL>  On PRAVASTATIN40 now; she states FLPs from her GYN & I told her he would have to fill her medication; if she wants [place] to fill it then we would have to be responsible for FLP & liver checks==> FLP 8/13 is at goal, continue same.  DJD>  Prev hx shot in knees from DrCollins...  GI>  Per DrGanem in the past & she has colon due in 2014...  Osteopenia>  BMD from DrLomax & he has her on Vivelle, Calcium, MVI, Vit D & exercise program...  Anxiety>  Not currently on meds & she declines...    Patient's Medications  New Prescriptions   No medications on file  Previous Medications   ASPIRIN 81 MG EC TABLET    Take 81 mg by mouth daily.     CALCIUM CITRATE-VITAMIN D (CITRACAL PETITES/VITAMIN D PO)  Take 1 tablet by mouth daily.     CHOLECALCIFEROL (VITAMIN D3) 2000 UNITS TABS    Take 1 tablet by mouth daily.     ESTRADIOL (VIVELLE-DOT) 0.075 MG/24HR    Place 1 patch onto the skin 2 (two) times a week.     ETODOLAC (LODINE) 400 MG TABLET    Take 400 mg by mouth 3 (three) times daily.   FLUOCINONIDE (LIDEX) 0.05 % CREAM    Apply topically 2 (two) times daily.     HYDROCHLOROTHIAZIDE (HYDRODIURIL) 12.5  MG TABLET    Take 1 tablet (12.5 mg total) by mouth daily.   MULTIPLE VITAMINS-MINERALS (OCUVITE ADULT 50+ PO)    Take 1 tablet by mouth daily.     POTASSIUM CHLORIDE (K-DUR) 10 MEQ TABLET    Take 2 capsules daily   PRAVASTATIN (PRAVACHOL) 40 MG TABLET    TAKE ONE TABLET BY MOUTH AT BEDTIME  Modified Medications   Modified Medication Previous Medication   ATENOLOL (TENORMIN) 50 MG TABLET atenolol (TENORMIN) 50 MG tablet      Take 50 mg by mouth 2 (two) times daily.    Take 1 tablet (50 mg total) by mouth 2 (two) times daily. Take 1 tablet by mouth two times a day  Discontinued Medications   PRAMIPEXOLE (MIRAPEX) 0.25 MG TABLET    Take 1 tablet (0.25 mg total) by mouth at bedtime.   PRAMIPEXOLE (MIRAPEX) 0.25 MG TABLET    Take 0.25 mg by mouth at bedtime as needed.

## 2012-06-30 ENCOUNTER — Other Ambulatory Visit: Payer: Self-pay | Admitting: Pulmonary Disease

## 2012-06-30 DIAGNOSIS — Z1231 Encounter for screening mammogram for malignant neoplasm of breast: Secondary | ICD-10-CM

## 2012-07-11 ENCOUNTER — Ambulatory Visit (HOSPITAL_COMMUNITY)
Admission: RE | Admit: 2012-07-11 | Discharge: 2012-07-11 | Disposition: A | Discharge status: Home / Self Care | Payer: Medicare Other | Source: Ambulatory Visit | Attending: Pulmonary Disease | Admitting: Pulmonary Disease

## 2012-07-11 DIAGNOSIS — Z1231 Encounter for screening mammogram for malignant neoplasm of breast: Secondary | ICD-10-CM | POA: Insufficient documentation

## 2012-07-13 ENCOUNTER — Other Ambulatory Visit: Payer: Self-pay | Admitting: Pulmonary Disease

## 2012-07-13 DIAGNOSIS — R928 Other abnormal and inconclusive findings on diagnostic imaging of breast: Secondary | ICD-10-CM

## 2012-07-14 ENCOUNTER — Ambulatory Visit (AMBULATORY_SURGERY_CENTER): Payer: Medicare Other | Admitting: *Deleted

## 2012-07-14 ENCOUNTER — Encounter: Payer: Self-pay | Admitting: Internal Medicine

## 2012-07-14 VITALS — Ht 61.5 in | Wt 132.8 lb

## 2012-07-14 DIAGNOSIS — Z1211 Encounter for screening for malignant neoplasm of colon: Secondary | ICD-10-CM

## 2012-07-14 MED ORDER — NA SULFATE-K SULFATE-MG SULF 17.5-3.13-1.6 GM/177ML PO SOLN: ORAL | Status: DC

## 2012-07-20 ENCOUNTER — Telehealth: Payer: Self-pay | Admitting: Pulmonary Disease

## 2012-07-20 NOTE — Telephone Encounter (Signed)
Will have SN sign the form and will fax this back to the [facility] center.

## 2012-07-22 ENCOUNTER — Ambulatory Visit
Admission: RE | Admit: 2012-07-22 | Discharge: 2012-07-22 | Disposition: A | Discharge status: Home / Self Care | Payer: Medicare Other | Source: Ambulatory Visit | Attending: Pulmonary Disease | Admitting: Pulmonary Disease

## 2012-07-22 DIAGNOSIS — R928 Other abnormal and inconclusive findings on diagnostic imaging of breast: Secondary | ICD-10-CM

## 2012-07-28 ENCOUNTER — Encounter: Payer: Self-pay | Admitting: Internal Medicine

## 2012-07-28 ENCOUNTER — Ambulatory Visit (AMBULATORY_SURGERY_CENTER): Payer: Medicare Other | Admitting: Internal Medicine

## 2012-07-28 VITALS — BP 122/67 | HR 55 | Temp 98.0°F | Resp 16 | Ht 61.5 in | Wt 132.0 lb

## 2012-07-28 DIAGNOSIS — Z1211 Encounter for screening for malignant neoplasm of colon: Secondary | ICD-10-CM

## 2012-07-28 MED ORDER — SODIUM CHLORIDE 0.9 % IV SOLN: 500.0000 mL | INTRAVENOUS | Status: DC

## 2012-07-28 NOTE — Patient Instructions (Addendum)
The colonoscopy was normal and the prep was excellent!  Please return to  care of Dr. Kriste Basque as planned.  Thank you for choosing me and [facility].  Iva Boop, MD, FACG  YOU HAD AN ENDOSCOPIC PROCEDURE TODAY AT THE [facility]: Refer to the procedure report that was given to you for any specific questions about what was found during the examination.  If the procedure report does not answer your questions, please call your gastroenterologist to clarify.  If you requested that your care partner not be given the details of your procedure findings, then the procedure report has been included in a sealed envelope for you to review at your convenience later.  YOU SHOULD EXPECT: Some feelings of bloating in the abdomen. Passage of more gas than usual.  Walking can help get rid of the air that was put into your GI tract during the procedure and reduce the bloating. If you had a lower endoscopy (such as a colonoscopy or flexible sigmoidoscopy) you may notice spotting of blood in your stool or on the toilet paper. If you underwent a bowel prep for your procedure, then you may not have a normal bowel movement for a few days.  DIET: Your first meal following the procedure should be a light meal and then it is ok to progress to your normal diet.  A half-sandwich or bowl of soup is an example of a good first meal.  Heavy or fried foods are harder to digest and may make you feel nauseous or bloated.  Likewise meals heavy in dairy and vegetables can cause extra gas to form and this can also increase the bloating.  Drink plenty of fluids but you should avoid alcoholic beverages for 24 hours.  ACTIVITY: Your care partner should take you home directly after the procedure.  You should plan to take it easy, moving slowly for the rest of the day.  You can resume normal activity the day after the procedure however you should NOT DRIVE or use heavy machinery for 24 hours (because of the  sedation medicines used during the test).    SYMPTOMS TO REPORT IMMEDIATELY: A gastroenterologist can be reached at any hour.  During normal business hours, 8:30 AM to 5:00 PM Monday through Friday, call [phone].  After hours and on weekends, please call the GI answering service at [phone] who will take a message and have the physician on call contact you.   Following lower endoscopy (colonoscopy or flexible sigmoidoscopy):  Excessive amounts of blood in the stool  Significant tenderness or worsening of abdominal pains  Swelling of the abdomen that is new, acute  Fever of 100F or higher  FOLLOW UP: If any biopsies were taken you will be contacted by phone or by letter within the next 1-3 weeks.  Call your gastroenterologist if you have not heard about the biopsies in 3 weeks.  Our staff will call the home number listed on your records the next business day following your procedure to check on you and address any questions or concerns that you may have at that time regarding the information given to you following your procedure. This is a courtesy call and so if there is no answer at the home number and we have not heard from you through the emergency physician on call, we will assume that you have returned to your regular daily activities without incident.  SIGNATURES/CONFIDENTIALITY: You and/or your care partner have signed paperwork which will be entered  into your electronic medical record.  These signatures attest to the fact that that the information above on your After Visit Summary has been reviewed and is understood.  Full responsibility of the confidentiality of this discharge information lies with you and/or your care-partner.  Diverticulosis-handout given  Return to Dr. Kriste Basque and see GI as needed

## 2012-07-28 NOTE — Progress Notes (Signed)
Patient did not experience any of the following events: a burn prior to discharge; a fall within the facility; wrong site/side/patient/procedure/implant event; or a hospital transfer or hospital admission upon discharge from the facility. (G8907) Patient did not have preoperative order for IV antibiotic SSI prophylaxis. (G8918)  

## 2012-07-28 NOTE — Op Note (Signed)
[facility] [address] [place], [address]   COLONOSCOPY PROCEDURE REPORT  PATIENT: Katelyn, [name]  MR#: [id number] BIRTHDATE: [birth year] , 71  yrs. old GENDER: Female ENDOSCOPIST: Iva Boop, MD, [facility] REFERRED EX:BMWUX Kriste Basque, M.D. PROCEDURE DATE:  07/28/2012 PROCEDURE:   Colonoscopy, screening ASA CLASS:   Class II INDICATIONS:average risk screening. MEDICATIONS: Propofol (Diprivan) 140 mg IV, MAC sedation, administered by CRNA, and These medications were titrated to patient response per physician's verbal order  DESCRIPTION OF PROCEDURE:   After the risks benefits and alternatives of the procedure were thoroughly explained, informed consent was obtained.  A digital rectal exam revealed no abnormalities of the rectum.   The LB [id number]  endoscope was introduced through the anus and advanced to the cecum, which was identified by both the appendix and ileocecal valve. No adverse events experienced.   The quality of the prep was     The instrument was then slowly withdrawn as the colon was fully examined.      COLON FINDINGS: There was mild scattered diverticulosis noted in the right colon.   The colon mucosa was otherwise normal.   A right colon retroflexion was performed.  Retroflexed views revealed no abnormalities. The time to cecum=3 minutes 54 seconds.  Withdrawal time=9 minutes 29 seconds.  The scope was withdrawn and the procedure completed. COMPLICATIONS: There were no complications.  ENDOSCOPIC IMPRESSION: 1.   There was mild diverticulosis noted in the left colon 2.   The colon mucosa was otherwise normal - excellent prep - second polyp-free screening colonoscopy (last 10 yrs ago)  RECOMMENDATIONS: return to Dr.  Kriste Basque and see GI as needed   eSigned:  Iva Boop, MD, [facility] 07/28/2012 9:33 AM   cc: Michele Mcalpine, MD and The Patient

## 2012-07-29 ENCOUNTER — Telehealth: Payer: Self-pay

## 2012-07-29 NOTE — Telephone Encounter (Signed)
  Follow up Call-  Call back number 07/28/2012  Post procedure Call Back phone  # [phone]  Permission to leave phone message Yes     Patient questions:  Do you have a fever, pain , or abdominal swelling? no Pain Score  0 *  Have you tolerated food without any problems? yes  Have you been able to return to your normal activities? yes  Do you have any questions about your discharge instructions: Diet   no Medications  no Follow up visit  no  Do you have questions or concerns about your Care? no  Actions: * If pain score is 4 or above: No action needed, pain <4.

## 2012-08-17 ENCOUNTER — Ambulatory Visit: Payer: Medicare Other | Admitting: Pulmonary Disease

## 2012-09-05 ENCOUNTER — Other Ambulatory Visit: Payer: Self-pay | Admitting: Pulmonary Disease

## 2012-12-15 ENCOUNTER — Other Ambulatory Visit (INDEPENDENT_AMBULATORY_CARE_PROVIDER_SITE_OTHER): Payer: Medicare Other

## 2012-12-15 ENCOUNTER — Ambulatory Visit (INDEPENDENT_AMBULATORY_CARE_PROVIDER_SITE_OTHER): Payer: Self-pay | Admitting: Pulmonary Disease

## 2012-12-15 ENCOUNTER — Encounter: Payer: Self-pay | Admitting: Pulmonary Disease

## 2012-12-15 VITALS — BP 140/80 | HR 59 | Temp 98.5°F | Ht 61.5 in | Wt 132.8 lb

## 2012-12-15 DIAGNOSIS — M1990 Unspecified osteoarthritis, unspecified site: Secondary | ICD-10-CM

## 2012-12-15 DIAGNOSIS — M949 Disorder of cartilage, unspecified: Secondary | ICD-10-CM

## 2012-12-15 DIAGNOSIS — E785 Hyperlipidemia, unspecified: Secondary | ICD-10-CM

## 2012-12-15 DIAGNOSIS — I10 Essential (primary) hypertension: Secondary | ICD-10-CM

## 2012-12-15 DIAGNOSIS — M899 Disorder of bone, unspecified: Secondary | ICD-10-CM

## 2012-12-15 DIAGNOSIS — F411 Generalized anxiety disorder: Secondary | ICD-10-CM

## 2012-12-15 LAB — CBC WITH DIFFERENTIAL/PLATELET
Basophils Relative: 0.4 % (ref 0.0–3.0)
Eosinophils Absolute: 0.2 10*3/uL (ref 0.0–0.7)
Eosinophils Relative: 2.2 % (ref 0.0–5.0)
Hemoglobin: 14.8 g/dL (ref 12.0–15.0)
MCHC: 34.7 g/dL (ref 30.0–36.0)
MCV: 91.3 fl (ref 78.0–100.0)
Monocytes Absolute: 0.6 10*3/uL (ref 0.1–1.0)
Neutro Abs: 5 10*3/uL (ref 1.4–7.7)
RBC: 4.68 Mil/uL (ref 3.87–5.11)

## 2012-12-15 LAB — BASIC METABOLIC PANEL
Calcium: 9.8 mg/dL (ref 8.4–10.5)
Chloride: 105 mEq/L (ref 96–112)
Creatinine, Ser: 0.8 mg/dL (ref 0.4–1.2)
GFR: 73.05 mL/min (ref >60.00)

## 2012-12-15 LAB — LIPID PANEL
HDL: 47.4 mg/dL (ref >39.00)
LDL Cholesterol: 116 mg/dL — ABNORMAL HIGH (ref 0–99)
Total CHOL/HDL Ratio: 4
Triglycerides: 113 mg/dL (ref 0.0–149.0)
VLDL: 22.6 mg/dL (ref 0.0–40.0)

## 2012-12-15 LAB — HEPATIC FUNCTION PANEL
Bilirubin, Direct: 0.1 mg/dL (ref 0.0–0.3)
Total Bilirubin: 1.1 mg/dL (ref 0.3–1.2)

## 2012-12-15 NOTE — Patient Instructions (Addendum)
Today we updated your med list in our EPIC system...    Continue your current medications the same...  Today we did your follow up FASTING blood work...    We will contact you w/ the results when available...   Keep up the good work w/ your diet & exercise program...  Call for any questions...  Let's plan a follow up visit in 1yr, sooner if needed for problems...   

## 2012-12-15 NOTE — Progress Notes (Signed)
Subjective:    Patient ID: Katelyn [name], female    DOB: [birth year], 72 y.o.   MRN: [id number]  HPI 72 y/o WF here for a follow up visit... She has mult medical problems including:  HBP;  Hyperlipidemia;  DJD;  Osteopenia; and Anxiety...   ~  August 18, 2010:  Yearly ROV doing well w/o new complaints or concerns... Just ret from trip to [place] & did some hiking etc all w/o difficulty.    HBP>  On Atenolol 50mg - taking 1/2 Bid (her choice) & tol well, BP= 116/84 & similar at home, denies CP, palpit, SOB, edema, etc...    Chol>  On Lipitor 20mg - taking 1/2 tab daily;  FLP by [facility] 12/11 showed TChol 157, TG 101, HDL 51, LDL 92; continue same...    DJD/ Osteopenia>  Followed by Ahmed Prima- last BMD 2008 showed TScores -0.5 to -1.5 & she has been on Vivelle dot, calcium, MVI, Vity D 2000u/d;  I would rec f/u BMD & she will check w/ GYN about this;  Notes some mild arthritic complaints intermittently> knees, neck, back & uses OTC Aleve etc...     Anxiety>  She had some Alpraz to use in the past but doing fine & does not want refill at this time...  ~  August 18, 2011:  Yearly ROV & Katelyn [name] has had a good year, no new complaints or concerns;  She continues to follow up w/ DrLomax, GYN & she tells me that he does her Labs and BMDs> asked to get copies forwarded to [place] for our review but she hasn't done this in the past & reminded to keep [place] in the loop... We reviewed her Meds & Prob list> see below>>  ~  December 10, 2011:  30mo ROV & Add-on for BP check> she saw TP 6/13 w/ mild elev of BP at home 150-160/ 80-90s on her Atenolol which was incr to 50mg  Bid & HCTZ 25mg /d added to the regimen; she checks her pressure very often & seems quite anxious about the numbers gbut declines anxiolytic Rx... BP today is 128/82 & she is content on these 2 meds; we discussed cutting the diuretic to 12.5mg /d and adding K10caps- 2 daily... We reviewed prob list, meds, xrays and labs> see below for updates>> LABS 8/13:  FLP on  Prav40 showed TChol 153, TG 93, HDL 51, LDL 83  ~  June 16, 2012:  44mo ROV & Katelyn [name] is c/o sore throat, hx abscessed tooth requiring 2 rounds of Clinda, never started the Mirapex, and she wants Rx for shingles vaccine today... She tells me she stopped her Estrogen on her own when a friend died w/ breast cancer, but she felt terrible off this med & Gyn started Vivelle patch- now feeling better; We reviewed the following medical problems during today's office visit >>     HBP>  On Atenolol 50mg Bid (her choice), HCT12.5, K10-2/d;  BP= 124/82 & similar at home, denies CP, palpit, SOB, edema, etc...    Chol>  On Prav40;  FLP here 8/13 showed TChol 153, TG 93, HDL 51, LDL 83; continue same...    GI- colonoscopy is due & we will refer to GI for this...    DJD/ Osteopenia>  Followed by Ahmed Prima- last BMD 2008 showed TScores -0.5 to -1.5 & she has been on Vivelle dot, calcium, MVI, VitD 2000u/d;  I would rec f/u BMD & she will check w/ GYN about this;  Notes some mild arthritic complaints intermittently> knees, neck, back &  uses OTC Aleve etc...     Anxiety>  She had some Alpraz to use in the past but doing fine & does not want refill at this time... We reviewed prob list, meds, xrays and labs> see below for updates >> she had the 2013 Flu shot 10/13; Shingle vaccine Rx written...  ~  December 15, 2012:  30mo ROV & Katelyn [name] reports doing well- no new complaints or concerns...     BP controlled on Atenolol 50mg Bid (her choice), HCT12.5, K10-2/d;  BP= 140/80 & she remains asymptomatic...    Lipids look reasonably good on Prav40; FLP 7/14 showed TChol 186, TG 113, HDL 47, LDL 116     She had colonoscopy 3/14 by DrGessner> mild diverticulosis, no polyps, f/u planned prn...  We reviewed prob list, meds, xrays and labs> see below for updates >> She has received the Shingles vaccine...  LABS 7/14:  FLP- ok on Prav40 x LDL=116;  Chems- wnl;  CBC- wnl;  TSH=0.87;  VitD=67         Problem List:   DYSPNEA  (ICD-786.05) PALPITATIONS (ICD-785.1) - pt notes these started after an emotional shock, and symptoms have abated... she was rec to take Alprazolam but she declined to get this filled...  HYPERTENSION (ICD-401.9) - controlled on ATENOLOL 50mg Bid + HCTZ 25mg /d... ~  she had a neg cardiac eval w/ treadmill 2006 from DrHSmith... ~  3/12:  BP=112/64 and pt denies HA, fatigue, visual changes, CP, dizziness, syncope, edema, etc... ~  3/13:  BP= 120/84 and she remains asymptomatic... ~  6/13:  She was seen by TP w/ c/o BP 150-160s/ 80-90s and her Atenolol was incr to 50mg  Bid + HCTZ25mg /d added to regimen... ~  7/13:  BP= 128/82 & feeling well; denies CP, palpit, SOB,m edema, etc; we decided to decr HCTZ to 12.5mg /d & add K10 caps- 2/d... ~  1/14: On Atenolol 50mg Bid (her choice), HCT12.5, K10-2/d;  BP= 124/82 & similar at home, denies CP, palpit, SOB, edema, etc. ~  7/14:  BP controlled on Atenolol 50mg Bid (her choice), HCT12.5, K10-2/d;  BP= 140/80 & she remains asymptomatic  HYPERLIPIDEMIA (ICD-272.4) - on PRAVASTATIN 40mg  daily...  ~  last FLP 5/08 showed TChol 178, TG 135, HDL 48, LDL 103 ~  FLP 11/10 by DrLomax showed TChol 179, TG 140, HDL 55, LDL 105 ~  She states f/u labs done by DrLomax but we don't have copies; asked to get records for [place] or allow [place] to check her labs... ~  FLP 8/13 on Prav40 showed TChol 153, TG 93, HDL 51, LDL 83 ~  FLP 7/14 on Prav40 showed TChol 186, TG 113, HDL 47, LDL 116   DEGENERATIVE JOINT DISEASE, MILD (ICD-715.90) - prev shot in knee from DrCollins...  OSTEOPENIA (ICD-733.90) - BMD's followed for yrs by her GYN DrLomax... ~  last BMD 9/08 w/ TScores -0.5 to -1.5 and improved on Vivelle dot, Ca++, Vits... ~  She is asked to get copies of her BMD sent to [place] to review=> last BMD by DrLomax 2/13 showed TScore FemNeck -1.7  ANXIETY (ICD-300.00) - we discussed "catecholamine excess" in relation to her palpit/ dyspnea/ etc and rec Alpraz 1/2 to 1 tab by mouth three  times a day for these symptoms...  Health Maintenance: GI = DrGanem w/ neg colon 2004 and neg sonar 2006... she uses Pepcid as needed; f/u colon in 2014... GU = DrLomax as noted, on Vivelle dot... [facility] She had Pneumovax & TDAP 3/11;  and rec to get the yearly seasonal flu vaccine in the fall; she received the Shingles vaccine in 2014...   Past Surgical History  Procedure Laterality Date  . Total abdominal hysterectomy  2001  . Bilateral salpingoophorectomy  2001  . Tonsillectomy  1957  . Colonoscopy  [id number]    Outpatient Encounter Prescriptions as of 12/15/2012  Medication Sig Dispense Refill  . aspirin 81 MG EC tablet Take 81 mg by mouth daily.        Marland Kitchen atenolol (TENORMIN) 50 MG tablet Take 50 mg by mouth 2 (two) times daily.      . Calcium Citrate-Vitamin D (CITRACAL PETITES/VITAMIN D PO) Take 1 tablet by mouth daily.        . Cholecalciferol (VITAMIN D3) 2000 UNITS TABS Take 1 tablet by mouth daily.        Marland Kitchen estradiol (VIVELLE-DOT) 0.075 MG/24HR Place 1 patch onto the skin 2 (two) times a week.        . etodolac (LODINE) 400 MG tablet Take 400 mg by mouth 3 (three) times daily.      . fluocinonide (LIDEX) 0.05 % cream Apply topically 2 (two) times daily.        . Multiple Vitamins-Minerals (OCUVITE ADULT 50+ PO) Take 1 tablet by mouth daily.        . potassium chloride (K-DUR) 10 MEQ tablet Take 2 capsules daily  180 tablet  3  . pravastatin (PRAVACHOL) 40 MG tablet TAKE ONE TABLET BY MOUTH AT BEDTIME  30 tablet  6   No facility-administered encounter medications on file as of 12/15/2012.    Allergies  Allergen Reactions  . Penicillins     REACTION: rash  . Tetracycline     REACTION: rash    Current Medications, Allergies, Past Medical History, Past Surgical History, Family History, and Social History were reviewed in [employer] record.    Review of Systems       See HPI - all other systems neg except as  noted...  The patient denies anorexia, fever, weight loss, weight gain, vision loss, decreased hearing, hoarseness, chest pain, syncope, dyspnea on exertion, peripheral edema, prolonged cough, headaches, hemoptysis, abdominal pain, melena, hematochezia, severe indigestion/heartburn, hematuria, incontinence, muscle weakness, suspicious skin lesions, transient blindness, difficulty walking, depression, unusual weight change, abnormal bleeding, enlarged lymph nodes, and angioedema.     Objective:   Physical Exam      WD, WN, 72 y/o WF in NAD... GENERAL:  Alert & oriented; pleasant & cooperative... HEENT:  [place]/AT, EOM-wnl, PERRLA, EACs-clear, TMs-wnl, NOSE-clear, THROAT-clear & wnl. NECK:  Supple w/ full ROM; no JVD; normal carotid impulses w/o bruits; no thyromegaly or nodules palpated; no lymphadenopathy. CHEST:  Clear to P & A; without wheezes/ rales/ or rhonchi. HEART:  Regular Rhythm; without murmurs/ rubs/ or gallops. ABDOMEN:  Soft & nontender; normal bowel sounds; no organomegaly or masses detected. EXT: without deformities or arthritic changes; no varicose veins/ venous insuffic/ or edema. NEURO:  CN's intact; motor testing normal; sensory testing normal; gait normal & balance OK. DERM:  No lesions noted; no rash etc...  RADIOLOGY DATA:  Reviewed in the EPIC EMR & discussed w/ the patient...  LABORATORY DATA:  Reviewed in the EPIC EMR & discussed w/ the patient...   Assessment & Plan:   HBP>  Controlled on Atenolol + HCT added;  Continue w/ adjustments & we discussed low sodium etc...  CHOL>  On PRAVASTATIN40 & FLP looks acceptable...  DJD>  Prev hx shot  in knees from DrCollins...  GI>  Colon 3/14 per drGessner was ok- divertics only- and f/u is prn...  Osteopenia>  BMD from DrLomax & he has her on Vivelle, Calcium, MVI, Vit D & exercise program...  Anxiety>  Not currently on meds & she declines...    Patient's Medications  New Prescriptions   No medications on file   Previous Medications   ASPIRIN 81 MG EC TABLET    Take 81 mg by mouth daily.     ATENOLOL (TENORMIN) 50 MG TABLET    Take 50 mg by mouth 2 (two) times daily.   CALCIUM CITRATE-VITAMIN D (CITRACAL PETITES/VITAMIN D PO)    Take 1 tablet by mouth daily.     CHOLECALCIFEROL (VITAMIN D3) 2000 UNITS TABS    Take 1 tablet by mouth daily.     ESTRADIOL (VIVELLE-DOT) 0.075 MG/24HR    Place 1 patch onto the skin 2 (two) times a week.     ETODOLAC (LODINE) 400 MG TABLET    Take 400 mg by mouth 3 (three) times daily.   FLUOCINONIDE (LIDEX) 0.05 % CREAM    Apply topically 2 (two) times daily.     HYDROCHLOROTHIAZIDE (MICROZIDE) 12.5 MG CAPSULE    Take 12.5 mg by mouth daily.   MULTIPLE VITAMINS-MINERALS (OCUVITE ADULT 50+ PO)    Take 1 tablet by mouth daily.     POTASSIUM CHLORIDE (K-DUR) 10 MEQ TABLET    Take 2 capsules daily   PRAVASTATIN (PRAVACHOL) 40 MG TABLET    TAKE ONE TABLET BY MOUTH AT BEDTIME  Modified Medications   No medications on file  Discontinued Medications   No medications on file

## 2012-12-16 LAB — VITAMIN D 25 HYDROXY (VIT D DEFICIENCY, FRACTURES): Vit D, 25-Hydroxy: 67 ng/mL (ref 30–89)

## 2013-01-03 ENCOUNTER — Other Ambulatory Visit: Payer: Self-pay | Admitting: Pulmonary Disease

## 2013-01-21 ENCOUNTER — Encounter (HOSPITAL_COMMUNITY): Payer: Self-pay | Admitting: Emergency Medicine

## 2013-01-21 ENCOUNTER — Emergency Department (HOSPITAL_COMMUNITY)
Admission: EM | Admit: 2013-01-21 | Discharge: 2013-01-21 | Disposition: A | Discharge status: Home / Self Care | Payer: Medicare Other | Source: Home / Self Care | Attending: Family Medicine | Admitting: Family Medicine

## 2013-01-21 DIAGNOSIS — IMO0002 Reserved for concepts with insufficient information to code with codable children: Secondary | ICD-10-CM

## 2013-01-21 DIAGNOSIS — S76312A Strain of muscle, fascia and tendon of the posterior muscle group at thigh level, left thigh, initial encounter: Secondary | ICD-10-CM

## 2013-01-21 NOTE — ED Notes (Signed)
Pt c/o  Left hip pain radiating down leg x 9 days. Pt states she hurt her left hip stepping up on a bus and bruising started 3 days ago. Pt took Aspirin for pain with little relief. Jan Ranson, SMA

## 2013-01-21 NOTE — ED Provider Notes (Signed)
CSN: [id number]     Arrival date & time 01/21/13  1338 History   First MD Initiated Contact with Patient 01/21/13 1447     Chief Complaint  Patient presents with  . Leg Pain   (Consider location/radiation/quality/duration/timing/severity/associated sxs/prior Treatment) Patient is a 72 y.o. female presenting with leg pain. The history is provided by the patient.  Leg Pain Location:  Leg Time since incident:  9 days Injury: yes   Mechanism of injury comment:  Onset with pulling herself up into bus in [place], bad prox pain initially, now with lat brusing for 3 d. Leg location:  L upper leg Pain details:    Quality:  Cramping   Radiates to:  Does not radiate   Severity:  Mild   Progression:  Unchanged Chronicity:  New Dislocation: no   Associated symptoms: no back pain     Past Medical History  Diagnosis Date  . Shortness of breath   . Palpitations   . Unspecified essential hypertension   . Other and unspecified hyperlipidemia   . Anxiety state, unspecified   . Osteoarthrosis, unspecified whether generalized or localized, unspecified site   . Disorder of bone and cartilage, unspecified    Past Surgical History  Procedure Laterality Date  . Total abdominal hysterectomy  2001  . Bilateral salpingoophorectomy  2001  . Tonsillectomy  1957  . Colonoscopy  [id number]   Family History  Problem Relation Age of Onset  . Pneumonia Mother   . Asthma    . Allergies    . Colon cancer Neg Hx   . Stomach cancer Neg Hx    History  Substance Use Topics  . Smoking status: Never Smoker   . Smokeless tobacco: Never Used  . Alcohol Use: No   OB History   Grav Para Term Preterm Abortions TAB SAB Ect Mult Living                 Review of Systems  Constitutional: Negative.   Gastrointestinal: Negative.   Musculoskeletal: Negative for back pain, joint swelling and gait problem.  Skin: Negative.     Allergies  Penicillins and Tetracycline  Home Medications   Current  Outpatient Rx  Name  Route  Sig  Dispense  Refill  . aspirin 81 MG EC tablet   Oral   Take 81 mg by mouth daily.           Marland Kitchen atenolol (TENORMIN) 50 MG tablet   Oral   Take 50 mg by mouth 2 (two) times daily.         . Calcium Citrate-Vitamin D (CITRACAL PETITES/VITAMIN D PO)   Oral   Take 1 tablet by mouth daily.           . Cholecalciferol (VITAMIN D3) 2000 UNITS TABS   Oral   Take 1 tablet by mouth daily.           Marland Kitchen estradiol (VIVELLE-DOT) 0.075 MG/24HR   Transdermal   Place 1 patch onto the skin 2 (two) times a week.           . etodolac (LODINE) 400 MG tablet   Oral   Take 400 mg by mouth 3 (three) times daily.         . fluocinonide (LIDEX) 0.05 % cream   Topical   Apply topically 2 (two) times daily.           . hydrochlorothiazide (HYDRODIURIL) 12.5 MG tablet      TAKE ONE TABLET BY  MOUTH DAILY   90 tablet   0   . Multiple Vitamins-Minerals (OCUVITE ADULT 50+ PO)   Oral   Take 1 tablet by mouth daily.           . potassium chloride (K-DUR) 10 MEQ tablet      Take 2 capsules daily   180 tablet   3   . pravastatin (PRAVACHOL) 40 MG tablet      TAKE ONE TABLET BY MOUTH AT BEDTIME   30 tablet   6    BP 138/65  Pulse 68  Temp(Src) 97.6 F (36.4 C) (Oral)  Resp 14  SpO2 98% Physical Exam  Nursing note and vitals reviewed. Constitutional: She is oriented to person, place, and time. She appears well-developed and well-nourished.  Musculoskeletal: Normal range of motion. She exhibits no tenderness.  Lat left thigh ecchymosis distally, no prox tenderness., full rom.  Neurological: She is alert and oriented to person, place, and time.  Skin: Skin is warm and dry.    ED Course  Procedures (including critical care time) Labs Review Labs Reviewed - No data to display Imaging Review No results found.  MDM   1. Hamstring muscle strain, left, initial encounter       Linna Hoff, MD 01/21/13 1521

## 2013-02-03 ENCOUNTER — Other Ambulatory Visit: Payer: Self-pay | Admitting: Pulmonary Disease

## 2013-02-23 ENCOUNTER — Other Ambulatory Visit: Payer: Self-pay | Admitting: Pulmonary Disease

## 2013-03-01 ENCOUNTER — Ambulatory Visit (INDEPENDENT_AMBULATORY_CARE_PROVIDER_SITE_OTHER): Payer: Medicare Other

## 2013-03-01 DIAGNOSIS — Z23 Encounter for immunization: Secondary | ICD-10-CM

## 2013-04-06 ENCOUNTER — Other Ambulatory Visit: Payer: Self-pay | Admitting: Pulmonary Disease

## 2013-05-22 ENCOUNTER — Telehealth: Payer: Self-pay | Admitting: Pulmonary Disease

## 2013-05-22 MED ORDER — AZITHROMYCIN 250 MG PO TABS: ORAL_TABLET | ORAL | Status: DC

## 2013-05-22 NOTE — Telephone Encounter (Signed)
Per SN---  zpak #1  Take as directed Rest Fluids tylenol 

## 2013-05-22 NOTE — Telephone Encounter (Signed)
Called, spoke with pt. On Dec 25, she noticed an "uncomfortable feeling like gas in lower abd" with chills and aching in knees.  She now has cough x 2 days with little to no mucus.  Mucus she does cough up is clear.  Temp up to 100.9.  Feels fatigued and weak.  Is taking ibuprofen qhs and used delsym with some relief.  Requesting further recs from SN.  Pls advise.  Thank you.  Last OV with SN: 12/15/12  Flu shot: 02/2013  [facility]  Allergies verified with pt: Allergies  Allergen Reactions  . Penicillins     REACTION: rash  . Tetracycline     REACTION: rash

## 2013-05-22 NOTE — Telephone Encounter (Signed)
Called, spoke with pt.  Informed her of below recs per Dr. Kriste Basque.  She verbalized understanding of this, is aware zpak sent to [facility], and is to call back if symptoms do not improve or worsen and seek emergency care if needed.

## 2013-05-25 DIAGNOSIS — D229 Melanocytic nevi, unspecified: Secondary | ICD-10-CM

## 2013-05-25 HISTORY — DX: Melanocytic nevi, unspecified: D22.9

## 2013-06-15 ENCOUNTER — Telehealth: Payer: Self-pay | Admitting: Pulmonary Disease

## 2013-06-15 NOTE — Telephone Encounter (Signed)
Per SN---  Once she has chosen a new primary care doctor---please call us and let us know so we can forward her records to them.  thanks

## 2013-06-15 NOTE — Telephone Encounter (Signed)
Spoke with pt. She reports she has been having some lower back pain. She saw Dr. Melvyn Novas from [facility] ortho and he did a hip xray a while back. Told her she most likely pulled a muscle. i advised pt that is who she needs to call for recs or to be seen. I also advised pt we will need to refer her for a new PCP as SN is retiring from [facility] this year. Pt has pending appt in July. Does this need to be cancelled? Please advise thanks

## 2013-07-04 ENCOUNTER — Other Ambulatory Visit: Payer: Self-pay | Admitting: Pulmonary Disease

## 2013-07-05 ENCOUNTER — Other Ambulatory Visit: Payer: Self-pay

## 2013-07-05 DIAGNOSIS — Z1231 Encounter for screening mammogram for malignant neoplasm of breast: Secondary | ICD-10-CM

## 2013-07-12 ENCOUNTER — Ambulatory Visit: Payer: Medicare HMO | Attending: Orthopedic Surgery | Admitting: Physical Therapy

## 2013-07-12 DIAGNOSIS — M25559 Pain in unspecified hip: Secondary | ICD-10-CM | POA: Insufficient documentation

## 2013-07-12 DIAGNOSIS — M25669 Stiffness of unspecified knee, not elsewhere classified: Secondary | ICD-10-CM | POA: Insufficient documentation

## 2013-07-12 DIAGNOSIS — I10 Essential (primary) hypertension: Secondary | ICD-10-CM | POA: Insufficient documentation

## 2013-07-12 DIAGNOSIS — IMO0001 Reserved for inherently not codable concepts without codable children: Secondary | ICD-10-CM | POA: Insufficient documentation

## 2013-07-12 DIAGNOSIS — M25569 Pain in unspecified knee: Secondary | ICD-10-CM | POA: Insufficient documentation

## 2013-07-17 ENCOUNTER — Ambulatory Visit: Payer: Medicare HMO | Admitting: Physical Therapy

## 2013-07-19 ENCOUNTER — Ambulatory Visit
Admission: RE | Admit: 2013-07-19 | Discharge: 2013-07-19 | Disposition: A | Discharge status: Home / Self Care | Payer: Self-pay | Source: Ambulatory Visit

## 2013-07-19 ENCOUNTER — Ambulatory Visit: Payer: Medicare Other

## 2013-07-19 ENCOUNTER — Ambulatory Visit: Payer: Medicare HMO | Admitting: Physical Therapy

## 2013-07-19 DIAGNOSIS — Z1231 Encounter for screening mammogram for malignant neoplasm of breast: Secondary | ICD-10-CM

## 2013-07-20 ENCOUNTER — Ambulatory Visit: Payer: Medicare HMO | Admitting: Physical Therapy

## 2013-07-21 ENCOUNTER — Other Ambulatory Visit: Payer: Self-pay | Admitting: Pulmonary Disease

## 2013-07-21 DIAGNOSIS — R928 Other abnormal and inconclusive findings on diagnostic imaging of breast: Secondary | ICD-10-CM

## 2013-07-24 ENCOUNTER — Ambulatory Visit: Payer: Medicare HMO | Attending: Orthopedic Surgery | Admitting: Physical Therapy

## 2013-07-24 DIAGNOSIS — M25669 Stiffness of unspecified knee, not elsewhere classified: Secondary | ICD-10-CM | POA: Insufficient documentation

## 2013-07-24 DIAGNOSIS — I10 Essential (primary) hypertension: Secondary | ICD-10-CM | POA: Insufficient documentation

## 2013-07-24 DIAGNOSIS — IMO0001 Reserved for inherently not codable concepts without codable children: Secondary | ICD-10-CM | POA: Insufficient documentation

## 2013-07-24 DIAGNOSIS — M25559 Pain in unspecified hip: Secondary | ICD-10-CM | POA: Insufficient documentation

## 2013-07-24 DIAGNOSIS — M25569 Pain in unspecified knee: Secondary | ICD-10-CM | POA: Insufficient documentation

## 2013-07-26 ENCOUNTER — Ambulatory Visit: Payer: Medicare HMO | Admitting: Physical Therapy

## 2013-07-31 ENCOUNTER — Encounter: Payer: Medicare HMO | Admitting: Physical Therapy

## 2013-08-02 ENCOUNTER — Encounter: Payer: Medicare HMO | Admitting: Physical Therapy

## 2013-08-03 ENCOUNTER — Ambulatory Visit
Admission: RE | Admit: 2013-08-03 | Discharge: 2013-08-03 | Disposition: A | Discharge status: Home / Self Care | Payer: Self-pay | Source: Ambulatory Visit | Attending: Pulmonary Disease | Admitting: Pulmonary Disease

## 2013-08-03 DIAGNOSIS — R928 Other abnormal and inconclusive findings on diagnostic imaging of breast: Secondary | ICD-10-CM

## 2013-08-04 ENCOUNTER — Other Ambulatory Visit: Payer: Self-pay | Admitting: Pulmonary Disease

## 2013-08-10 NOTE — Telephone Encounter (Signed)
Pt has pending apt with new PCP July. Refill sent

## 2013-10-17 ENCOUNTER — Other Ambulatory Visit: Payer: Self-pay | Admitting: Physician Assistant

## 2013-10-17 DIAGNOSIS — C4491 Basal cell carcinoma of skin, unspecified: Secondary | ICD-10-CM

## 2013-10-17 HISTORY — DX: Basal cell carcinoma of skin, unspecified: C44.91

## 2013-11-27 ENCOUNTER — Encounter: Payer: Self-pay | Admitting: Internal Medicine

## 2013-11-27 ENCOUNTER — Ambulatory Visit (INDEPENDENT_AMBULATORY_CARE_PROVIDER_SITE_OTHER): Payer: Medicare HMO | Admitting: Internal Medicine

## 2013-11-27 VITALS — BP 110/74 | HR 67 | Temp 98.0°F | Resp 18 | Ht 60.5 in | Wt 137.0 lb

## 2013-11-27 DIAGNOSIS — Z23 Encounter for immunization: Secondary | ICD-10-CM

## 2013-11-27 DIAGNOSIS — M1990 Unspecified osteoarthritis, unspecified site: Secondary | ICD-10-CM

## 2013-11-27 DIAGNOSIS — E785 Hyperlipidemia, unspecified: Secondary | ICD-10-CM

## 2013-11-27 DIAGNOSIS — M949 Disorder of cartilage, unspecified: Secondary | ICD-10-CM

## 2013-11-27 DIAGNOSIS — K219 Gastro-esophageal reflux disease without esophagitis: Secondary | ICD-10-CM

## 2013-11-27 DIAGNOSIS — M899 Disorder of bone, unspecified: Secondary | ICD-10-CM

## 2013-11-27 DIAGNOSIS — I10 Essential (primary) hypertension: Secondary | ICD-10-CM

## 2013-11-27 MED ORDER — PRAVASTATIN SODIUM 40 MG PO TABS: ORAL_TABLET | ORAL | Status: DC

## 2013-11-27 NOTE — Progress Notes (Signed)
Subjective:    Patient ID: Katelyn [name], female    DOB: December 10, 1940, 73 y.o.   MRN: [id number]  HPI 54 white female, who is seen today to establish with our practice. She has a history of hypertension, dyslipidemia, and mild osteoarthritis.  She has osteopenia.  She is doing quite well without concerns or complaints She did have a final screening colonoscopy one year ago. She has had a mammogram and gynecologic check.  Earlier this year.  Family history both parents died at approximately age 88.  Father had osteoporosis and died of metastatic melanoma.  Mother died in a nursing home.  Past Medical History  Diagnosis Date  . Shortness of breath   . Palpitations   . Unspecified essential hypertension   . Other and unspecified hyperlipidemia   . Anxiety state, unspecified   . Osteoarthrosis, unspecified whether generalized or localized, unspecified site   . Disorder of bone and cartilage, unspecified     History   Social History  . Marital Status: Married    Spouse Name: N/A    Number of Children: N/A  . Years of Education: N/A   Occupational History  . Not on file.   Social History Main Topics  . Smoking status: Never Smoker   . Smokeless tobacco: Never Used  . Alcohol Use: No  . Drug Use: No  . Sexual Activity: Not on file   Other Topics Concern  . Not on file   Social History Narrative  . No narrative on file    Past Surgical History  Procedure Laterality Date  . Total abdominal hysterectomy  2001  . Bilateral salpingoophorectomy  2001  . Tonsillectomy  1957  . Colonoscopy  [id number]    Family History  Problem Relation Age of Onset  . Pneumonia Mother   . Asthma    . Allergies    . Colon cancer Neg Hx   . Stomach cancer Neg Hx     Allergies  Allergen Reactions  . Penicillins     REACTION: rash  . Tetracycline     REACTION: rash    Current Outpatient Prescriptions on File Prior to Visit  Medication Sig Dispense Refill  . aspirin 81 MG  EC tablet Take 81 mg by mouth daily.        Marland Kitchen atenolol (TENORMIN) 50 MG tablet TAKE ONE TABLET BY MOUTH TWICE DAILY  180 tablet  3  . Calcium Citrate-Vitamin D (CITRACAL PETITES/VITAMIN D PO) Take 1 tablet by mouth daily.        . Cholecalciferol (VITAMIN D3) 2000 UNITS TABS Take 1 tablet by mouth daily.        Marland Kitchen estradiol (VIVELLE-DOT) 0.075 MG/24HR Place 1 patch onto the skin 2 (two) times a week.        . etodolac (LODINE) 400 MG tablet Take 400 mg by mouth 3 (three) times daily.      . hydrochlorothiazide (HYDRODIURIL) 12.5 MG tablet TAKE ONE TABLET BY MOUTH ONCE DAILY  90 tablet  2  . KLOR-CON M10 10 MEQ tablet TAKE TWO TABLETS BY MOUTH EVERY DAY  180 tablet  3  . Multiple Vitamins-Minerals (OCUVITE ADULT 50+ PO) Take 1 tablet by mouth daily.        . [DISCONTINUED] potassium chloride (K-DUR) 10 MEQ tablet Take 2 capsules daily  180 tablet  3   No current facility-administered medications on file prior to visit.    BP 110/74  Pulse 67  Temp(Src) 98 F (36.7 C) (  Oral)  Resp 18  Ht 5' 0.5" (1.537 m)  Wt 137 lb (62.143 kg)  BMI 26.31 kg/m2  SpO2 98%      Review of Systems  Constitutional: Negative for fever, appetite change, fatigue and unexpected weight change.  HENT: Negative for congestion, dental problem, ear pain, hearing loss, mouth sores, nosebleeds, sinus pressure, sore throat, tinnitus, trouble swallowing and voice change.   Eyes: Negative for photophobia, pain, redness and visual disturbance.  Respiratory: Negative for cough, chest tightness and shortness of breath.   Cardiovascular: Negative for chest pain, palpitations and leg swelling.  Gastrointestinal: Negative for nausea, vomiting, abdominal pain, diarrhea, constipation, blood in stool, abdominal distention and rectal pain.  Genitourinary: Negative for dysuria, urgency, frequency, hematuria, flank pain, vaginal bleeding, vaginal discharge, difficulty urinating, genital sores, vaginal pain, menstrual problem and  pelvic pain.  Musculoskeletal: Negative for arthralgias, back pain and neck stiffness.  Skin: Negative for rash.  Neurological: Negative for dizziness, syncope, speech difficulty, weakness, light-headedness, numbness and headaches.  Hematological: Negative for adenopathy. Does not bruise/bleed easily.  Psychiatric/Behavioral: Negative for suicidal ideas, behavioral problems, self-injury, dysphoric mood and agitation. The patient is not nervous/anxious.        Objective:   Physical Exam  Constitutional: She is oriented to person, place, and time. She appears well-developed and well-nourished.  HENT:  Head: Normocephalic and atraumatic.  Right Ear: External ear normal.  Left Ear: External ear normal.  Mouth/Throat: Oropharynx is clear and moist.  Eyes: Conjunctivae and EOM are normal.  Neck: Normal range of motion. Neck supple. No JVD present. No thyromegaly present.  Cardiovascular: Normal rate, regular rhythm, normal heart sounds and intact distal pulses.   No murmur heard. Dorsalis pedis pulses full.  Posterior tibial pulses faint  Pulmonary/Chest: Effort normal and breath sounds normal. She has no wheezes. She has no rales.  Abdominal: Soft. Bowel sounds are normal. She exhibits no distension and no mass. There is no tenderness. There is no rebound and no guarding.  Musculoskeletal: Normal range of motion. She exhibits no edema and no tenderness.  Neurological: She is alert and oriented to person, place, and time. She has normal reflexes. No cranial nerve deficit. She exhibits normal muscle tone. Coordination normal.  Skin: Skin is warm and dry. No rash noted.  Psychiatric: She has a normal mood and affect. Her behavior is normal.          Assessment & Plan:   Preventive health examination Hypertension well controlled, osteopenia.  Continue calcium and vitamin D supplementation Dyslipidemia.  Continue statin therapy  Recheck with lab in 6 months

## 2013-11-27 NOTE — Patient Instructions (Signed)
Limit your sodium (Salt) intake  Please check your blood pressure on a regular basis.  If it is consistently greater than 150/90, please make an office appointment.    It is important that you exercise regularly, at least 20 minutes 3 to 4 times per week.  If you develop chest pain or shortness of breath seek  medical attention.  Take a calcium supplement, plus 800-1200 units of vitamin D  Return in 6 months for follow-up   

## 2013-11-27 NOTE — Progress Notes (Signed)
Pre visit review using our clinic review tool, if applicable. No additional management support is needed unless otherwise documented below in the visit note. 

## 2013-11-28 ENCOUNTER — Telehealth: Payer: Self-pay | Admitting: Internal Medicine

## 2013-11-28 NOTE — Telephone Encounter (Signed)
Relevant patient education assigned to patient using Emmi. ° °

## 2013-12-18 ENCOUNTER — Ambulatory Visit: Payer: Medicare Other | Admitting: Pulmonary Disease

## 2014-01-02 ENCOUNTER — Other Ambulatory Visit: Payer: Self-pay | Admitting: Pulmonary Disease

## 2014-01-03 ENCOUNTER — Telehealth: Payer: Self-pay | Admitting: Internal Medicine

## 2014-01-03 MED ORDER — HYDROCHLOROTHIAZIDE 12.5 MG PO TABS: ORAL_TABLET | ORAL | Status: DC

## 2014-01-03 NOTE — Telephone Encounter (Signed)
[facility] [place], [place] - [address] Is requesting re-fill on hydrochlorothiazide (HYDRODIURIL) 12.5 MG tablet

## 2014-01-03 NOTE — Telephone Encounter (Signed)
Rx sent 

## 2014-01-30 ENCOUNTER — Other Ambulatory Visit: Payer: Self-pay | Admitting: *Deleted

## 2014-01-30 ENCOUNTER — Other Ambulatory Visit: Payer: Self-pay | Admitting: Pulmonary Disease

## 2014-01-30 MED ORDER — POTASSIUM CHLORIDE CRYS ER 10 MEQ PO TBCR: EXTENDED_RELEASE_TABLET | ORAL | Status: DC

## 2014-02-17 ENCOUNTER — Other Ambulatory Visit: Payer: Self-pay | Admitting: Pulmonary Disease

## 2014-02-19 ENCOUNTER — Telehealth: Payer: Self-pay | Admitting: Internal Medicine

## 2014-02-19 MED ORDER — ATENOLOL 50 MG PO TABS: ORAL_TABLET | ORAL | Status: DC

## 2014-02-19 NOTE — Telephone Encounter (Signed)
Rx sent 

## 2014-02-19 NOTE — Telephone Encounter (Signed)
[facility] [place], [place] - [address] Is requesting re-fill on atenolol (TENORMIN) 50 MG tablet

## 2014-02-20 ENCOUNTER — Other Ambulatory Visit: Payer: Self-pay | Admitting: Physician Assistant

## 2014-05-30 ENCOUNTER — Ambulatory Visit (INDEPENDENT_AMBULATORY_CARE_PROVIDER_SITE_OTHER): Payer: PPO | Admitting: Internal Medicine

## 2014-05-30 ENCOUNTER — Encounter: Payer: Self-pay | Admitting: Internal Medicine

## 2014-05-30 VITALS — BP 132/90 | HR 63 | Temp 98.0°F | Resp 18 | Ht 60.5 in | Wt 138.0 lb

## 2014-05-30 DIAGNOSIS — I10 Essential (primary) hypertension: Secondary | ICD-10-CM

## 2014-05-30 DIAGNOSIS — E785 Hyperlipidemia, unspecified: Secondary | ICD-10-CM

## 2014-05-30 NOTE — Patient Instructions (Signed)
Limit your sodium (Salt) intake  Please check your blood pressure on a regular basis.  If it is consistently greater than 150/90, please make an office appointment.  Take a calcium supplement, plus 800-1200 units of vitamin D  Return in 6 months for follow-up  

## 2014-05-30 NOTE — Progress Notes (Signed)
Subjective:    Patient ID: Katelyn [name], female    DOB: 1941-02-06, 74 y.o.   MRN: [id number]  HPI  Wt Readings from Last 3 Encounters:  05/30/14 138 lb (62.596 kg)  11/27/13 137 lb (62.143 kg)  12/15/12 132 lb 12.8 oz (60.57 kg)   74 year old patient who is seen today in follow-up.  She has essential hypertension, dyslipidemia.  She has done quite well over the past 6 months.  No recent lab. No cardiopulmonary complaints.  Remains on calcium and vitamin D supplements.  4.  Osteopenia.  Past Medical History  Diagnosis Date  . Shortness of breath   . Palpitations   . Unspecified essential hypertension   . Other and unspecified hyperlipidemia   . Anxiety state, unspecified   . Osteoarthrosis, unspecified whether generalized or localized, unspecified site   . Disorder of bone and cartilage, unspecified     History   Social History  . Marital Status: Married    Spouse Name: N/A    Number of Children: N/A  . Years of Education: N/A   Occupational History  . Not on file.   Social History Main Topics  . Smoking status: Never Smoker   . Smokeless tobacco: Never Used  . Alcohol Use: No  . Drug Use: No  . Sexual Activity: Not on file   Other Topics Concern  . Not on file   Social History Narrative    Past Surgical History  Procedure Laterality Date  . Total abdominal hysterectomy  2001  . Bilateral salpingoophorectomy  2001  . Tonsillectomy  1957  . Colonoscopy  [id number]    Family History  Problem Relation Age of Onset  . Pneumonia Mother   . Asthma    . Allergies    . Colon cancer Neg Hx   . Stomach cancer Neg Hx     Allergies  Allergen Reactions  . Penicillins     REACTION: rash  . Tetracycline     REACTION: rash    Current Outpatient Prescriptions on File Prior to Visit  Medication Sig Dispense Refill  . aspirin 81 MG EC tablet Take 81 mg by mouth daily.      Marland Kitchen atenolol (TENORMIN) 50 MG tablet TAKE ONE TABLET BY MOUTH TWICE DAILY 180  tablet 1  . Calcium Citrate-Vitamin D (CITRACAL PETITES/VITAMIN D PO) Take 1 tablet by mouth daily.      . Cholecalciferol (VITAMIN D3) 2000 UNITS TABS Take 1 tablet by mouth daily.      Marland Kitchen estradiol (VIVELLE-DOT) 0.075 MG/24HR Place 1 patch onto the skin 2 (two) times a week.      . hydrochlorothiazide (HYDRODIURIL) 12.5 MG tablet TAKE ONE TABLET BY MOUTH ONCE DAILY 90 tablet 3  . Multiple Vitamins-Minerals (OCUVITE ADULT 50+ PO) Take 1 tablet by mouth daily.      . potassium chloride (KLOR-CON M10) 10 MEQ tablet TAKE TWO TABLETS BY MOUTH EVERY DAY 180 tablet 3  . pravastatin (PRAVACHOL) 40 MG tablet TAKE ONE TABLET BY MOUTH AT BEDTIME 90 tablet 3  . [DISCONTINUED] potassium chloride (K-DUR) 10 MEQ tablet Take 2 capsules daily 180 tablet 3   No current facility-administered medications on file prior to visit.    BP 132/90 mmHg  Pulse 63  Temp(Src) 98 F (36.7 C) (Oral)  Resp 18  Ht 5' 0.5" (1.537 m)  Wt 138 lb (62.596 kg)  BMI 26.50 kg/m2  SpO2 97%     Review of Systems  Constitutional: Negative.  HENT: Negative for congestion, dental problem, hearing loss, rhinorrhea, sinus pressure, sore throat and tinnitus.   Eyes: Negative for pain, discharge and visual disturbance.  Respiratory: Negative for cough and shortness of breath.   Cardiovascular: Negative for chest pain, palpitations and leg swelling.  Gastrointestinal: Negative for nausea, vomiting, abdominal pain, diarrhea, constipation, blood in stool and abdominal distention.  Genitourinary: Negative for dysuria, urgency, frequency, hematuria, flank pain, vaginal bleeding, vaginal discharge, difficulty urinating, vaginal pain and pelvic pain.  Musculoskeletal: Negative for joint swelling, arthralgias and gait problem.  Skin: Negative for rash.  Neurological: Negative for dizziness, syncope, speech difficulty, weakness, numbness and headaches.  Hematological: Negative for adenopathy.  Psychiatric/Behavioral: Negative for  behavioral problems, dysphoric mood and agitation. The patient is not nervous/anxious.        Objective:   Physical Exam  Constitutional: She is oriented to person, place, and time. She appears well-developed and well-nourished.  Blood pressure 140/80  HENT:  Head: Normocephalic.  Right Ear: External ear normal.  Left Ear: External ear normal.  Mouth/Throat: Oropharynx is clear and moist.  Eyes: Conjunctivae and EOM are normal. Pupils are equal, round, and reactive to light.  Neck: Normal range of motion. Neck supple. No thyromegaly present.  Cardiovascular: Normal rate, regular rhythm, normal heart sounds and intact distal pulses.   Pulmonary/Chest: Effort normal and breath sounds normal.  Abdominal: Soft. Bowel sounds are normal. She exhibits no mass. There is no tenderness.  Musculoskeletal: Normal range of motion.  Lymphadenopathy:    She has no cervical adenopathy.  Neurological: She is alert and oriented to person, place, and time.  Skin: Skin is warm and dry. No rash noted.  Psychiatric: She has a normal mood and affect. Her behavior is normal.          Assessment & Plan:   Hypertension, controlled Dyslipidemia.  Continue statin therapy Osteopenia.  Continue vitamin D and calcium supplements  CPX 6 months

## 2014-05-30 NOTE — Progress Notes (Signed)
Pre visit review using our clinic review tool, if applicable. No additional management support is needed unless otherwise documented below in the visit note. 

## 2014-06-25 ENCOUNTER — Observation Stay (HOSPITAL_COMMUNITY)
Admission: EM | Admit: 2014-06-25 | Discharge: 2014-06-27 | Disposition: A | Discharge status: Home / Self Care | Payer: PPO | Attending: Internal Medicine | Admitting: Internal Medicine

## 2014-06-25 ENCOUNTER — Emergency Department (HOSPITAL_COMMUNITY): Payer: PPO

## 2014-06-25 ENCOUNTER — Encounter (HOSPITAL_COMMUNITY): Payer: Self-pay | Admitting: *Deleted

## 2014-06-25 DIAGNOSIS — I10 Essential (primary) hypertension: Secondary | ICD-10-CM | POA: Insufficient documentation

## 2014-06-25 DIAGNOSIS — I2510 Atherosclerotic heart disease of native coronary artery without angina pectoris: Secondary | ICD-10-CM | POA: Insufficient documentation

## 2014-06-25 DIAGNOSIS — R079 Chest pain, unspecified: Principal | ICD-10-CM | POA: Insufficient documentation

## 2014-06-25 DIAGNOSIS — E785 Hyperlipidemia, unspecified: Secondary | ICD-10-CM | POA: Diagnosis not present

## 2014-06-25 DIAGNOSIS — Z79899 Other long term (current) drug therapy: Secondary | ICD-10-CM | POA: Diagnosis not present

## 2014-06-25 DIAGNOSIS — M8580 Other specified disorders of bone density and structure, unspecified site: Secondary | ICD-10-CM | POA: Insufficient documentation

## 2014-06-25 DIAGNOSIS — R61 Generalized hyperhidrosis: Secondary | ICD-10-CM

## 2014-06-25 DIAGNOSIS — Z7982 Long term (current) use of aspirin: Secondary | ICD-10-CM | POA: Insufficient documentation

## 2014-06-25 LAB — BASIC METABOLIC PANEL
Anion gap: 9 (ref 5–15)
BUN: 14 mg/dL (ref 6–23)
CO2: 25 mmol/L (ref 19–32)
Calcium: 9.6 mg/dL (ref 8.4–10.5)
Chloride: 103 mmol/L (ref 96–112)
Creatinine, Ser: 0.77 mg/dL (ref 0.50–1.10)
GFR calc non Af Amer: 82 mL/min — ABNORMAL LOW (ref >90)
Glucose, Bld: 95 mg/dL (ref 70–99)
Potassium: 3.8 mmol/L (ref 3.5–5.1)
Sodium: 137 mmol/L (ref 135–145)

## 2014-06-25 LAB — I-STAT TROPONIN, ED: TROPONIN I, POC: 0 ng/mL (ref 0.00–0.08)

## 2014-06-25 LAB — CBC
HEMATOCRIT: 38.5 % (ref 36.0–46.0)
HEMOGLOBIN: 13.4 g/dL (ref 12.0–15.0)
MCH: 31.1 pg (ref 26.0–34.0)
MCHC: 34.8 g/dL (ref 30.0–36.0)
MCV: 89.3 fL (ref 78.0–100.0)
PLATELETS: 242 10*3/uL (ref 150–400)
RBC: 4.31 MIL/uL (ref 3.87–5.11)
RDW: 12.7 % (ref 11.5–15.5)
WBC: 8.9 10*3/uL (ref 4.0–10.5)

## 2014-06-25 NOTE — ED Notes (Signed)
Pt in c/o left arm pain and heaviness, states she has had episodes of diaphoresis also, this has happened twice in the last week, symptoms returned today and are heaviness continued, also shortness of breath with exertion recently also

## 2014-06-25 NOTE — ED Provider Notes (Signed)
CSN: [id number]     Arrival date & time 06/25/14  2041 History   First MD Initiated Contact with Patient 06/25/14 2213     Chief Complaint  Patient presents with  . Arm Pain     (Consider location/radiation/quality/duration/timing/severity/associated sxs/prior Treatment) HPI Katelyn [name] is a 74 year old female with past medical history of hypertension who presents the ER with complaint of several episodes of diaphoresis and left arm heaviness. She reports the first episode happened earlier in the week while exercising at the [facility]. Tonight she states she "felt bad all night", and while driving to work she noted onset of the diaphoresis along with the left arm heaviness again. Patient states she at that point drove home and had her husband drive her to the emergency room. Patient denies any aggravating or alleviating factors that she knows of, states both times the episodes resolved spontaneously. She denies any associated chest pain, shortness of breath, palpitations, dizziness, weakness, headache, blurred vision, nausea, vomiting.  Past Medical History  Diagnosis Date  . Shortness of breath   . Palpitations   . Unspecified essential hypertension   . Other and unspecified hyperlipidemia   . Anxiety state, unspecified   . Osteoarthrosis, unspecified whether generalized or localized, unspecified site   . Disorder of bone and cartilage, unspecified    Past Surgical History  Procedure Laterality Date  . Total abdominal hysterectomy  2001  . Bilateral salpingoophorectomy  2001  . Tonsillectomy  1957  . Colonoscopy  [id number]   Family History  Problem Relation Age of Onset  . Pneumonia Mother   . Asthma    . Allergies    . Colon cancer Neg Hx   . Stomach cancer Neg Hx    History  Substance Use Topics  . Smoking status: Never Smoker   . Smokeless tobacco: Never Used  . Alcohol Use: No   OB History    No data available     Review of Systems  Constitutional: Positive for  diaphoresis. Negative for fever.  HENT: Negative for trouble swallowing.   Eyes: Negative for visual disturbance.  Respiratory: Negative for shortness of breath.   Cardiovascular: Negative for chest pain.  Gastrointestinal: Negative for nausea, vomiting and abdominal pain.  Genitourinary: Negative for dysuria.  Musculoskeletal: Negative for neck pain.       Arm heaviness  Skin: Negative for rash.  Neurological: Negative for dizziness, weakness and numbness.  Psychiatric/Behavioral: Negative.        Allergies  Penicillins and Tetracycline  Home Medications   Prior to Admission medications   Medication Sig Start Date End Date Taking? Authorizing Provider  aspirin 81 MG EC tablet Take 81 mg by mouth daily.     Yes Historical Provider, MD  atenolol (TENORMIN) 50 MG tablet TAKE ONE TABLET BY MOUTH TWICE DAILY 02/19/14  Yes Marletta Lor, MD  Calcium Citrate-Vitamin D (CITRACAL PETITES/VITAMIN D PO) Take 1 tablet by mouth daily.     Yes Historical Provider, MD  Cholecalciferol (VITAMIN D3) 2000 UNITS TABS Take 1 tablet by mouth daily.     Yes Historical Provider, MD  Cyanocobalamin (VITAMIN B-12 PO) Take 1 tablet by mouth daily.   Yes Historical Provider, MD  estradiol (VIVELLE-DOT) 0.075 MG/24HR Place 1 patch onto the skin 2 (two) times a week. Sunday and Wednesday   Yes Historical Provider, MD  hydrochlorothiazide (HYDRODIURIL) 12.5 MG tablet TAKE ONE TABLET BY MOUTH ONCE DAILY 01/03/14  Yes Marletta Lor, MD  Multiple Vitamin (MULTIVITAMIN WITH MINERALS)  TABS tablet Take 1 tablet by mouth daily.   Yes Historical Provider, MD  potassium chloride (KLOR-CON M10) 10 MEQ tablet TAKE TWO TABLETS BY MOUTH EVERY DAY 01/30/14  Yes Marletta Lor, MD  pravastatin (PRAVACHOL) 40 MG tablet TAKE ONE TABLET BY MOUTH AT BEDTIME 11/27/13  Yes Marletta Lor, MD  Multiple Vitamins-Minerals (OCUVITE ADULT 50+ PO) Take 1 tablet by mouth daily.      Historical Provider, MD   BP 121/60  mmHg  Pulse 64  Temp(Src) 97.8 F (36.6 C) (Oral)  Resp 18  Ht 5\' 1"  (1.549 m)  Wt 139 lb 8 oz (63.277 kg)  BMI 26.37 kg/m2  SpO2 97% Physical Exam  Constitutional: She is oriented to person, place, and time. She appears well-developed and well-nourished. No distress.  HENT:  Head: Normocephalic and atraumatic.  Mouth/Throat: Oropharynx is clear and moist. No oropharyngeal exudate.  Eyes: Right eye exhibits no discharge. Left eye exhibits no discharge. No scleral icterus.  Neck: Normal range of motion.  Cardiovascular: Normal rate, regular rhythm, S1 normal, S2 normal and normal heart sounds.   No murmur heard. Pulses:      Radial pulses are 2+ on the right side, and 2+ on the left side.       Dorsalis pedis pulses are 2+ on the right side, and 2+ on the left side.  Pulmonary/Chest: Effort normal and breath sounds normal. No accessory muscle usage. No tachypnea. No respiratory distress.  Abdominal: Soft. Normal appearance and bowel sounds are normal. There is no tenderness.  Musculoskeletal: Normal range of motion. She exhibits no edema or tenderness.  Neurological: She is alert and oriented to person, place, and time. She has normal strength. No cranial nerve deficit or sensory deficit. Coordination normal.  Patient fully alert, answering questions appropriately in full, clear sentences. Cranial nerves II through XII grossly intact. Motor strength 5 out of 5 in all major muscle groups of upper and lower extremities. Distal sensation intact.  Skin: Skin is warm and dry. No rash noted. She is not diaphoretic.  Psychiatric: She has a normal mood and affect.  Nursing note and vitals reviewed.   ED Course  Procedures (including critical care time) Labs Review Labs Reviewed  BASIC METABOLIC PANEL - Abnormal; Notable for the following:    GFR calc non Af Amer 82 (*)    All other components within normal limits  CREATININE, SERUM - Abnormal; Notable for the following:    GFR calc non  Af Amer 81 (*)    All other components within normal limits  COMPREHENSIVE METABOLIC PANEL - Abnormal; Notable for the following:    GFR calc non Af Amer 82 (*)    All other components within normal limits  LIPID PANEL - Abnormal; Notable for the following:    Triglycerides 159 (*)    LDL Cholesterol 112 (*)    All other components within normal limits  CBC  CBC  CBC WITH DIFFERENTIAL/PLATELET  TSH  TROPONIN I  TROPONIN I  TROPONIN I  PROTIME-INR  HEMOGLOBIN A1C  COMPREHENSIVE METABOLIC PANEL  CBC WITH DIFFERENTIAL/PLATELET  Randolm Idol, ED    Imaging Review Dg Chest 2 View  06/26/2014   CLINICAL DATA:  Left-sided chest discomfort. Diaphoresis. Symptoms began this evening.  EXAM: CHEST  2 VIEW  COMPARISON:  07/28/2007  FINDINGS: Lower lung volumes from prior exam. The heart remains at the upper limits of normal in size. Pulmonary vasculature is normal. There is no consolidation, pleural effusion, or  pneumothorax. Stable degenerative change at the right acromioclavicular joint.  IMPRESSION: No acute pulmonary process, stable borderline cardiomegaly.   Electronically Signed   By: Jeb Levering M.D.   On: 06/26/2014 00:24     EKG Interpretation   Date/Time:  Monday June 25 2014 20:57:13 EST Ventricular Rate:  65 PR Interval:  184 QRS Duration: 86 QT Interval:  410 QTC Calculation: 426 R Axis:   2 Text Interpretation:  Normal sinus rhythm Low voltage QRS Borderline ECG  Confirmed by Jeneen Rinks  MD, Rudolph ([id number]) on 06/26/2014 12:04:12 AM      MDM   Final diagnoses:  Diaphoresis    Patient here with signs and symptoms concerning for anginal equivalent. Patient still having some mild discomfort in her left arm and shoulder. Plan to admit patient for ACS rule out. Troponin negative. EKG without evidence of acute injury or ectopy. Chest x-ray without acute abnormalities.  I spoke with cardiology who agrees with plan to admit patient to medicine.  Patient admitted to  medicine for ACS rule out. The patient appears reasonably stabilized for admission considering the current resources, flow, and capabilities available in the ED at this time, and I doubt any other [facility] requiring further screening and/or treatment in the ED prior to admission.  BP 121/60 mmHg  Pulse 64  Temp(Src) 97.8 F (36.6 C) (Oral)  Resp 18  Ht 5\' 1"  (1.549 m)  Wt 139 lb 8 oz (63.277 kg)  BMI 26.37 kg/m2  SpO2 97%  Signed,  Dahlia Bailiff, PA-C 10:22 PM  Patient seen and discussed with Dr. Tanna Furry, M.D.    Carrie Mew, PA-C 06/26/14 2222  Tanna Furry, MD 06/27/14 [phone]

## 2014-06-26 ENCOUNTER — Encounter (HOSPITAL_COMMUNITY): Payer: Self-pay | Admitting: *Deleted

## 2014-06-26 ENCOUNTER — Encounter (HOSPITAL_COMMUNITY)
Admission: EM | Disposition: A | Discharge status: Home / Self Care | Payer: Self-pay | Source: Home / Self Care | Attending: Emergency Medicine

## 2014-06-26 DIAGNOSIS — I25110 Atherosclerotic heart disease of native coronary artery with unstable angina pectoris: Secondary | ICD-10-CM

## 2014-06-26 DIAGNOSIS — R072 Precordial pain: Secondary | ICD-10-CM

## 2014-06-26 DIAGNOSIS — I209 Angina pectoris, unspecified: Secondary | ICD-10-CM

## 2014-06-26 DIAGNOSIS — I10 Essential (primary) hypertension: Secondary | ICD-10-CM

## 2014-06-26 DIAGNOSIS — R079 Chest pain, unspecified: Secondary | ICD-10-CM | POA: Diagnosis present

## 2014-06-26 DIAGNOSIS — E785 Hyperlipidemia, unspecified: Secondary | ICD-10-CM

## 2014-06-26 DIAGNOSIS — R61 Generalized hyperhidrosis: Secondary | ICD-10-CM

## 2014-06-26 HISTORY — PX: LEFT HEART CATHETERIZATION WITH CORONARY ANGIOGRAM: SHX5451

## 2014-06-26 LAB — CBC
HCT: 39.6 % (ref 36.0–46.0)
Hemoglobin: 13.7 g/dL (ref 12.0–15.0)
MCH: 30.7 pg (ref 26.0–34.0)
MCHC: 34.6 g/dL (ref 30.0–36.0)
MCV: 88.8 fL (ref 78.0–100.0)
PLATELETS: 232 10*3/uL (ref 150–400)
RBC: 4.46 MIL/uL (ref 3.87–5.11)
RDW: 12.5 % (ref 11.5–15.5)
WBC: 7.1 10*3/uL (ref 4.0–10.5)

## 2014-06-26 LAB — CREATININE, SERUM
CREATININE: 0.79 mg/dL (ref 0.50–1.10)
GFR calc Af Amer: >90 mL/min (ref >90)
GFR, EST NON AFRICAN AMERICAN: 81 mL/min — AB (ref >90)

## 2014-06-26 LAB — COMPREHENSIVE METABOLIC PANEL
ALBUMIN: 4.1 g/dL (ref 3.5–5.2)
ALK PHOS: 46 U/L (ref 39–117)
ALT: 32 U/L (ref 0–35)
ANION GAP: 7 (ref 5–15)
AST: 31 U/L (ref 0–37)
BUN: 12 mg/dL (ref 6–23)
CHLORIDE: 105 mmol/L (ref 96–112)
CO2: 26 mmol/L (ref 19–32)
CREATININE: 0.76 mg/dL (ref 0.50–1.10)
Calcium: 9.3 mg/dL (ref 8.4–10.5)
GFR calc non Af Amer: 82 mL/min — ABNORMAL LOW (ref >90)
GLUCOSE: 98 mg/dL (ref 70–99)
POTASSIUM: 3.7 mmol/L (ref 3.5–5.1)
SODIUM: 138 mmol/L (ref 135–145)
Total Bilirubin: 0.9 mg/dL (ref 0.3–1.2)
Total Protein: 6.7 g/dL (ref 6.0–8.3)

## 2014-06-26 LAB — CBC WITH DIFFERENTIAL/PLATELET
Basophils Absolute: 0 10*3/uL (ref 0.0–0.1)
Basophils Relative: 0 % (ref 0–1)
Eosinophils Absolute: 0.2 10*3/uL (ref 0.0–0.7)
Eosinophils Relative: 3 % (ref 0–5)
HCT: 38.8 % (ref 36.0–46.0)
Hemoglobin: 13.5 g/dL (ref 12.0–15.0)
LYMPHS PCT: 31 % (ref 12–46)
Lymphs Abs: 2.2 10*3/uL (ref 0.7–4.0)
MCH: 30.9 pg (ref 26.0–34.0)
MCHC: 34.8 g/dL (ref 30.0–36.0)
MCV: 88.8 fL (ref 78.0–100.0)
Monocytes Absolute: 0.5 10*3/uL (ref 0.1–1.0)
Monocytes Relative: 7 % (ref 3–12)
NEUTROS ABS: 4.3 10*3/uL (ref 1.7–7.7)
Neutrophils Relative %: 59 % (ref 43–77)
Platelets: 231 10*3/uL (ref 150–400)
RBC: 4.37 MIL/uL (ref 3.87–5.11)
RDW: 12.5 % (ref 11.5–15.5)
WBC: 7.2 10*3/uL (ref 4.0–10.5)

## 2014-06-26 LAB — TROPONIN I
Troponin I: <0.03 ng/mL (ref <0.031)
Troponin I: <0.03 ng/mL (ref <0.031)

## 2014-06-26 LAB — LIPID PANEL
Cholesterol: 196 mg/dL (ref 0–200)
HDL: 52 mg/dL (ref >39)
LDL Cholesterol: 112 mg/dL — ABNORMAL HIGH (ref 0–99)
TRIGLYCERIDES: 159 mg/dL — AB (ref <150)
Total CHOL/HDL Ratio: 3.8 RATIO
VLDL: 32 mg/dL (ref 0–40)

## 2014-06-26 LAB — PROTIME-INR
INR: 1.11 (ref 0.00–1.49)
Prothrombin Time: 14.4 seconds (ref 11.6–15.2)

## 2014-06-26 LAB — TSH: TSH: 2.076 u[IU]/mL (ref 0.350–4.500)

## 2014-06-26 SURGERY — LEFT HEART CATHETERIZATION WITH CORONARY ANGIOGRAM

## 2014-06-26 MED ORDER — ASPIRIN 325 MG PO TABS
325.0000 mg | ORAL_TABLET | Freq: Once | ORAL | Status: AC
  Administered 2014-06-26: 325 mg via ORAL
  Filled 2014-06-26: qty 1

## 2014-06-26 MED ORDER — HEPARIN SODIUM (PORCINE) 5000 UNIT/ML IJ SOLN
5000.0000 [IU] | Freq: Three times a day (TID) | INTRAMUSCULAR | Status: DC
  Administered 2014-06-26 – 2014-06-27 (×2): 5000 [IU] via SUBCUTANEOUS
  Filled 2014-06-26 (×7): qty 1

## 2014-06-26 MED ORDER — MORPHINE SULFATE 2 MG/ML IJ SOLN: 1.0000 mg | INTRAMUSCULAR | Status: DC | PRN

## 2014-06-26 MED ORDER — HYDROCHLOROTHIAZIDE 25 MG PO TABS: 12.5000 mg | ORAL_TABLET | Freq: Every day | ORAL | Status: DC

## 2014-06-26 MED ORDER — SODIUM CHLORIDE 0.9 % IV SOLN
1.0000 mL/kg/h | INTRAVENOUS | Status: AC
  Administered 2014-06-26: 1 mL/kg/h via INTRAVENOUS

## 2014-06-26 MED ORDER — HYDROCHLOROTHIAZIDE 12.5 MG PO CAPS
12.5000 mg | ORAL_CAPSULE | Freq: Every day | ORAL | Status: DC
  Administered 2014-06-26 – 2014-06-27 (×2): 12.5 mg via ORAL
  Filled 2014-06-26 (×2): qty 1

## 2014-06-26 MED ORDER — SODIUM CHLORIDE 0.9 % IJ SOLN: 3.0000 mL | INTRAMUSCULAR | Status: DC | PRN

## 2014-06-26 MED ORDER — VERAPAMIL HCL 2.5 MG/ML IV SOLN
INTRAVENOUS | Status: AC
  Filled 2014-06-26: qty 2

## 2014-06-26 MED ORDER — FENTANYL CITRATE 0.05 MG/ML IJ SOLN
INTRAMUSCULAR | Status: AC
  Filled 2014-06-26: qty 2

## 2014-06-26 MED ORDER — ATENOLOL 50 MG PO TABS
50.0000 mg | ORAL_TABLET | Freq: Every day | ORAL | Status: DC
  Administered 2014-06-26 – 2014-06-27 (×2): 50 mg via ORAL
  Filled 2014-06-26 (×2): qty 1

## 2014-06-26 MED ORDER — NITROGLYCERIN 0.4 MG SL SUBL: 0.4000 mg | SUBLINGUAL_TABLET | SUBLINGUAL | Status: DC | PRN

## 2014-06-26 MED ORDER — ONDANSETRON HCL 4 MG/2ML IJ SOLN: 4.0000 mg | Freq: Four times a day (QID) | INTRAMUSCULAR | Status: DC | PRN

## 2014-06-26 MED ORDER — SODIUM CHLORIDE 0.9 % IV SOLN
INTRAVENOUS | Status: DC
  Administered 2014-06-26: 11:00:00 via INTRAVENOUS

## 2014-06-26 MED ORDER — SODIUM CHLORIDE 0.9 % IJ SOLN: 3.0000 mL | Freq: Two times a day (BID) | INTRAMUSCULAR | Status: DC

## 2014-06-26 MED ORDER — SODIUM CHLORIDE 0.9 % IJ SOLN
3.0000 mL | Freq: Two times a day (BID) | INTRAMUSCULAR | Status: DC
  Administered 2014-06-27 (×2): 3 mL via INTRAVENOUS

## 2014-06-26 MED ORDER — HEPARIN (PORCINE) IN NACL 2-0.9 UNIT/ML-% IJ SOLN
INTRAMUSCULAR | Status: AC
  Filled 2014-06-26: qty 1000

## 2014-06-26 MED ORDER — MIDAZOLAM HCL 2 MG/2ML IJ SOLN
INTRAMUSCULAR | Status: AC
  Filled 2014-06-26: qty 2

## 2014-06-26 MED ORDER — NITROGLYCERIN 1 MG/10 ML FOR IR/CATH LAB
INTRA_ARTERIAL | Status: AC
  Filled 2014-06-26: qty 10

## 2014-06-26 MED ORDER — PERFLUTREN LIPID MICROSPHERE
1.0000 mL | INTRAVENOUS | Status: AC | PRN
  Administered 2014-06-26: 2 mL via INTRAVENOUS
  Filled 2014-06-26: qty 10

## 2014-06-26 MED ORDER — LIDOCAINE HCL (PF) 1 % IJ SOLN
INTRAMUSCULAR | Status: AC
  Filled 2014-06-26: qty 30

## 2014-06-26 MED ORDER — ASPIRIN 81 MG PO CHEW: 81.0000 mg | CHEWABLE_TABLET | ORAL | Status: DC

## 2014-06-26 MED ORDER — ADULT MULTIVITAMIN W/MINERALS CH
1.0000 | ORAL_TABLET | Freq: Every day | ORAL | Status: DC
  Administered 2014-06-26 – 2014-06-27 (×2): 1 via ORAL
  Filled 2014-06-26 (×2): qty 1

## 2014-06-26 MED ORDER — ASPIRIN EC 325 MG PO TBEC
325.0000 mg | DELAYED_RELEASE_TABLET | Freq: Every day | ORAL | Status: DC
  Administered 2014-06-26 – 2014-06-27 (×2): 325 mg via ORAL
  Filled 2014-06-26 (×2): qty 1

## 2014-06-26 MED ORDER — SODIUM CHLORIDE 0.9 % IJ SOLN
3.0000 mL | INTRAMUSCULAR | Status: DC | PRN
Start: 2014-06-26 — End: 2014-06-27

## 2014-06-26 MED ORDER — SODIUM CHLORIDE 0.9 % IV SOLN
INTRAVENOUS | Status: DC
  Administered 2014-06-26: 75 mL/h via INTRAVENOUS

## 2014-06-26 MED ORDER — SODIUM CHLORIDE 0.9 % IJ SOLN
3.0000 mL | Freq: Two times a day (BID) | INTRAMUSCULAR | Status: DC
  Administered 2014-06-26 – 2014-06-27 (×3): 3 mL via INTRAVENOUS

## 2014-06-26 MED ORDER — SODIUM CHLORIDE 0.9 % IV SOLN: 250.0000 mL | INTRAVENOUS | Status: DC | PRN

## 2014-06-26 MED ORDER — PRAVASTATIN SODIUM 40 MG PO TABS
40.0000 mg | ORAL_TABLET | Freq: Every day | ORAL | Status: DC
  Administered 2014-06-26: 40 mg via ORAL
  Filled 2014-06-26 (×2): qty 1

## 2014-06-26 MED ORDER — ONDANSETRON HCL 4 MG PO TABS: 4.0000 mg | ORAL_TABLET | Freq: Four times a day (QID) | ORAL | Status: DC | PRN

## 2014-06-26 NOTE — H&P (View-Only) (Signed)
CONSULT NOTE  Date: 06/26/2014               Patient Name:  Katelyn [name] MRN: [id number]  DOB: 1940/09/13 Age / Sex: 74 y.o., female        PCP: Nyoka Cowden Primary Cardiologist: New/Azyiah Bo            Referring Physician: Ree Kida              Reason for Consult: Chest pain            History of Present Illness: Patient is a 74 y.o. female with a PMHx of HTN, HLD, , who was admitted to [facility] on 06/25/2014 for evaluation of  Chest pain Has had pain intermittantily  for 2 weeks.  The first was just after she worked out at [employer].   Associated with some left arm discomfort and chest heaviness. There is also some radiation to neck. She also has some dyspnea on exertion.  She had profuse diaphoresis which is unusual for her .  She had another episode yesterday that was very similar  - this one occurred with minimal exertion . The episodes last for several minutes  Hx of HTN and hyperlipidemia   Medications: Outpatient medications: Prescriptions prior to admission  Medication Sig Dispense Refill Last Dose  . aspirin 81 MG EC tablet Take 81 mg by mouth daily.     06/25/2014 at Unknown time  . atenolol (TENORMIN) 50 MG tablet TAKE ONE TABLET BY MOUTH TWICE DAILY 180 tablet 1 06/25/2014 at 1800  . Calcium Citrate-Vitamin D (CITRACAL PETITES/VITAMIN D PO) Take 1 tablet by mouth daily.     06/25/2014 at Unknown time  . Cholecalciferol (VITAMIN D3) 2000 UNITS TABS Take 1 tablet by mouth daily.     06/25/2014 at Unknown time  . Cyanocobalamin (VITAMIN B-12 PO) Take 1 tablet by mouth daily.   06/25/2014 at Unknown time  . estradiol (VIVELLE-DOT) 0.075 MG/24HR Place 1 patch onto the skin 2 (two) times a week. Sunday and Wednesday   Past Week at Unknown time  . hydrochlorothiazide (HYDRODIURIL) 12.5 MG tablet TAKE ONE TABLET BY MOUTH ONCE DAILY 90 tablet 3 06/25/2014 at Unknown time  . Multiple Vitamin (MULTIVITAMIN WITH MINERALS) TABS tablet Take 1 tablet by mouth daily.   06/25/2014 at  Unknown time  . potassium chloride (KLOR-CON M10) 10 MEQ tablet TAKE TWO TABLETS BY MOUTH EVERY DAY 180 tablet 3 06/25/2014 at Unknown time  . pravastatin (PRAVACHOL) 40 MG tablet TAKE ONE TABLET BY MOUTH AT BEDTIME 90 tablet 3 06/25/2014 at Unknown time  . Multiple Vitamins-Minerals (OCUVITE ADULT 50+ PO) Take 1 tablet by mouth daily.     Not Taking at Unknown time    Current medications: Current Facility-Administered Medications  Medication Dose Route Frequency Provider Last Rate Last Dose  . 0.9 %  sodium chloride infusion   Intravenous Continuous Shanda Howells, MD 75 mL/hr at 06/26/14 0518 75 mL/hr at 06/26/14 0518  . aspirin EC tablet 325 mg  325 mg Oral Daily Shanda Howells, MD      . atenolol (TENORMIN) tablet 50 mg  50 mg Oral Daily Shanda Howells, MD      . heparin injection 5,000 Units  5,000 Units Subcutaneous 3 times per day Shanda Howells, MD   5,000 Units at 06/26/14 0517  . hydrochlorothiazide (MICROZIDE) capsule 12.5 mg  12.5 mg Oral Daily Shanda Howells, MD      . morphine 2 MG/ML injection  1-2 mg  1-2 mg Intravenous Q3H PRN Shanda Howells, MD      . multivitamin with minerals tablet 1 tablet  1 tablet Oral Daily Shanda Howells, MD      . nitroGLYCERIN (NITROSTAT) SL tablet 0.4 mg  0.4 mg Sublingual Q5 min PRN Shanda Howells, MD      . ondansetron [facility]) tablet 4 mg  4 mg Oral Q6H PRN Shanda Howells, MD       Or  . ondansetron [facility]) injection 4 mg  4 mg Intravenous Q6H PRN Shanda Howells, MD      . pravastatin (PRAVACHOL) tablet 40 mg  40 mg Oral q1800 Shanda Howells, MD      . sodium chloride 0.9 % injection 3 mL  3 mL Intravenous Q12H Shanda Howells, MD   3 mL at 06/26/14 0519     Allergies  Allergen Reactions  . Penicillins     REACTION: rash  . Tetracycline     REACTION: rash     Past Medical History  Diagnosis Date  . Shortness of breath   . Palpitations   . Unspecified essential hypertension   . Other and unspecified hyperlipidemia   . Anxiety state, unspecified     . Osteoarthrosis, unspecified whether generalized or localized, unspecified site   . Disorder of bone and cartilage, unspecified     Past Surgical History  Procedure Laterality Date  . Total abdominal hysterectomy  2001  . Bilateral salpingoophorectomy  2001  . Tonsillectomy  1957  . Colonoscopy  3299,2426    Family History  Problem Relation Age of Onset  . Pneumonia Mother   . Asthma    . Allergies    . Colon cancer Neg Hx   . Stomach cancer Neg Hx     Social History:  reports that she has never smoked. She has never used smokeless tobacco. She reports that she does not drink alcohol or use illicit drugs.   Review of Systems: Constitutional:  denies fever, chills, diaphoresis, appetite change and fatigue.  HEENT: denies photophobia, eye pain, redness, hearing loss, ear pain, congestion, sore throat, rhinorrhea, sneezing, neck pain, neck stiffness and tinnitus.  Respiratory: denies SOB, DOE, cough, chest tightness, and wheezing.  Cardiovascular: admits to chest pain, / heaviness,  She denies  palpitations and leg swelling.  Gastrointestinal: denies nausea, vomiting, abdominal pain, diarrhea, constipation, blood in stool.  Genitourinary: denies dysuria, urgency, frequency, hematuria, flank pain and difficulty urinating.  Musculoskeletal: denies  myalgias, back pain, joint swelling, arthralgias and gait problem.   Skin: denies pallor, rash and wound.  Neurological: denies dizziness, seizures, syncope, weakness, light-headedness, numbness and headaches.   Hematological: denies adenopathy, easy bruising, personal or family bleeding history.  Psychiatric/ Behavioral: denies suicidal ideation, mood changes, confusion, nervousness, sleep disturbance and agitation.    Physical Exam: BP 132/68 mmHg  Pulse 61  Temp(Src) 98.3 F (36.8 C) (Oral)  Resp 19  Ht 5\' 1"  (1.549 m)  Wt 139 lb 8 oz (63.277 kg)  BMI 26.37 kg/m2  SpO2 100%  Wt Readings from Last 3 Encounters:  06/26/14  139 lb 8 oz (63.277 kg)  05/30/14 138 lb (62.596 kg)  11/27/13 137 lb (62.143 kg)    General: Vital signs reviewed and noted. Well-developed, well-nourished, in no acute distress; alert,   Head: Normocephalic, atraumatic, sclera anicteric,   Neck: Supple. Negative for carotid bruits. No JVD   Lungs:  Clear bilaterally, no  wheezes, rales, or rhonchi. Breathing is normal   Heart: RRR  with S1 S2. No murmurs, rubs, or gallops   Abdomen/ GI :  Soft, non-tender, non-distended with normoactive bowel sounds. No hepatomegaly. No rebound/guarding. No obvious abdominal masses   MSK: Strength and the appear normal for age.   Extremities: No clubbing or cyanosis. No edema.  Distal pedal pulses are 2+ and equal   Neurologic:  CN are grossly intact,  No obvious motor or sensory defect.  Alert and oriented X 3. Moves all extremities spontaneously.  Psych: Responds to questions appropriately with a normal affect.     Lab results: Basic Metabolic Panel:  Recent Labs Lab 06/25/14 2102 06/26/14 0219 06/26/14 0223  NA 137  --  138  K 3.8  --  3.7  CL 103  --  105  CO2 25  --  26  GLUCOSE 95  --  98  BUN 14  --  12  CREATININE 0.77 0.79 0.76  CALCIUM 9.6  --  9.3    Liver Function Tests:  Recent Labs Lab 06/26/14 0223  AST 31  ALT 32  ALKPHOS 46  BILITOT 0.9  PROT 6.7  ALBUMIN 4.1   No results for input(s): LIPASE, AMYLASE in the last 168 hours. No results for input(s): AMMONIA in the last 168 hours.  CBC:  Recent Labs Lab 06/25/14 2102 06/26/14 0219 06/26/14 0223  WBC 8.9 7.1 7.2  NEUTROABS  --   --  4.3  HGB 13.4 13.7 13.5  HCT 38.5 39.6 38.8  MCV 89.3 88.8 88.8  PLT 242 232 231    Cardiac Enzymes:  Recent Labs Lab 06/26/14 0219  TROPONINI <0.03    BNP: Invalid input(s): POCBNP  CBG: No results for input(s): GLUCAP in the last 168 hours.  Coagulation Studies: No results for input(s): LABPROT, INR in the last 72 hours.   Other results:  Personal review  of EKG shows :  - NSR , No ST or T wave changes.    Imaging: Dg Chest 2 View  06/26/2014   CLINICAL DATA:  Left-sided chest discomfort. Diaphoresis. Symptoms began this evening.  EXAM: CHEST  2 VIEW  COMPARISON:  07/28/2007  FINDINGS: Lower lung volumes from prior exam. The heart remains at the upper limits of normal in size. Pulmonary vasculature is normal. There is no consolidation, pleural effusion, or pneumothorax. Stable degenerative change at the right acromioclavicular joint.  IMPRESSION: No acute pulmonary process, stable borderline cardiomegaly.   Electronically Signed   By: Jeb Levering M.D.   On: 06/26/2014 00:24     Assessment & Plan:  1.  Chest tightness / left shoulder heaviness:  Symptoms are worrisome for unstable angina . Given the fact that she has had these symptoms with little to no exertion since her initial episode, I think we should consider cardiac cath.  She ate a little bit of breakfast this am so an myoview would need to wait until tomorrow.   I've discussed the risks, benefits, options with her and her daughter.  She understands and agress to proceed.   3. Hypertension:  Continue current meds  3.  Hyperlipidemia:  LDL is 112.  Continue pravachol 40 for now.  Consider changing to atorvastatin if she is found to have CAD.   Thayer Headings, Brooke Bonito., MD, [facility] 06/26/2014, 8:36 AM Office - [phone] Pager [phone]

## 2014-06-26 NOTE — ED Provider Notes (Signed)
Pt seen and evaluated.  History reviewed.  Reports LUE heaviness with "soaking" diaphoresis.  Occurred 2 weeks ago c exertion at [facility]. Recurred today at rest.  EKG without acute changes. Normal First troponin.  D/W PA.  I  recommend admit for rule out and addtitional testing.  Tanna Furry, MD 06/26/14 [phone]

## 2014-06-26 NOTE — Progress Notes (Signed)
Patient was admitted earlier this morning by Dr. Shanda Howells, agree with current assessment and plan  This is a 74 year old female with history of hypertension hyperlipidemia presented with left shoulder and arm/chest discomfort.  Patient seen and examined. Vital signs stable Gen.: Well-developed, well-nourished. Distress Cardiac: RRR, normal S1-S2, no murmurs Respiratory clear to auscultation bilaterally Extremities no clubbing cyanosis or edema  Assessment and plan Chest pain -Cardiology consulted appreciated -Continue to monitor her cardiac enzymes -Plan for stress Myoview today, cardiac catheterization tomorrow  Hypertension -Continue home medications  Hyperlipidemia -Continue statin, would consider changing to atorvastatin pending results of cardiac catheterization   Time spent: 30 minutes  Rosaria Kubin D.O. [facility] Pager [phone]  If 7PM-7AM, please contact night-coverage [email or web address] Password [facility] 06/26/2014, 12:33 PM

## 2014-06-26 NOTE — Interval H&P Note (Signed)
History and Physical Interval Note:  06/26/2014 3:48 PM  Katelyn [name]  has presented today for surgery, with the diagnosis of cp concerning for unstable angina/class IV symptoms The various methods of treatment have been discussed with the patient and family. After consideration of risks, benefits and other options for treatment, the patient has consented to  Procedure(s): LEFT HEART CATHETERIZATION WITH CORONARY ANGIOGRAM (N/A) plus or minus PCI as a surgical intervention .  The patient's history has been reviewed, patient examined, no change in status, stable for surgery.  I have reviewed the patient's chart and labs.  Questions were answered to the patient's satisfaction.    Cath Lab Visit (complete for each Cath Lab visit)  Clinical Evaluation Leading to the Procedure:   ACS: Yes.    Non-ACS:    Anginal Classification: CCS IV  Anti-ischemic medical therapy: Minimal Therapy (1 class of medications)  Non-Invasive Test Results: No non-invasive testing performed  Prior CABG: No previous CABG  AUC  CAD Assessment (Coronary Angiography With or Without Left Heart Catheterization and/or Left Ventriculography)  Patient Information:  UA/NSTEMI, Intermediate Risk  AUC Score:   A (8)   Indication:   3  For PCI TIMI SCORE  Patient Information:  TIMI Score is 3  UA/NSTEMI and intermediate-risk features (e.g., TIMI score 3?4) for short-term risk of death or nonfatal MI  Revascularization of the presumed culprit artery   A (9)  Indication: 10; Score: 9   Katelyn [name]

## 2014-06-26 NOTE — Progress Notes (Signed)
Echocardiogram 2D Echocardiogram has been performed.  Katelyn [name] 06/26/2014, 2:07 PM

## 2014-06-26 NOTE — Progress Notes (Signed)
UR completed 

## 2014-06-26 NOTE — Consult Note (Signed)
CONSULT NOTE  Date: 06/26/2014               Patient Name:  Katelyn [name] MRN: [id number]  DOB: [birth year] Age / Sex: 74 y.o., female        PCP: Nyoka Cowden Primary Cardiologist: New/Rashay Barnette            Referring Physician: Ree Kida              Reason for Consult: Chest pain            History of Present Illness: Patient is a 74 y.o. female with a PMHx of HTN, HLD, , who was admitted to [facility] on 06/25/2014 for evaluation of  Chest pain Has had pain intermittantily  for 2 weeks.  The first was just after she worked out at [employer].   Associated with some left arm discomfort and chest heaviness. There is also some radiation to neck. She also has some dyspnea on exertion.  She had profuse diaphoresis which is unusual for her .  She had another episode yesterday that was very similar  - this one occurred with minimal exertion . The episodes last for several minutes  Hx of HTN and hyperlipidemia   Medications: Outpatient medications: Prescriptions prior to admission  Medication Sig Dispense Refill Last Dose  . aspirin 81 MG EC tablet Take 81 mg by mouth daily.     06/25/2014 at Unknown time  . atenolol (TENORMIN) 50 MG tablet TAKE ONE TABLET BY MOUTH TWICE DAILY 180 tablet 1 06/25/2014 at 1800  . Calcium Citrate-Vitamin D (CITRACAL PETITES/VITAMIN D PO) Take 1 tablet by mouth daily.     06/25/2014 at Unknown time  . Cholecalciferol (VITAMIN D3) 2000 UNITS TABS Take 1 tablet by mouth daily.     06/25/2014 at Unknown time  . Cyanocobalamin (VITAMIN B-12 PO) Take 1 tablet by mouth daily.   06/25/2014 at Unknown time  . estradiol (VIVELLE-DOT) 0.075 MG/24HR Place 1 patch onto the skin 2 (two) times a week. Sunday and Wednesday   Past Week at Unknown time  . hydrochlorothiazide (HYDRODIURIL) 12.5 MG tablet TAKE ONE TABLET BY MOUTH ONCE DAILY 90 tablet 3 06/25/2014 at Unknown time  . Multiple Vitamin (MULTIVITAMIN WITH MINERALS) TABS tablet Take 1 tablet by mouth daily.   06/25/2014 at  Unknown time  . potassium chloride (KLOR-CON M10) 10 MEQ tablet TAKE TWO TABLETS BY MOUTH EVERY DAY 180 tablet 3 06/25/2014 at Unknown time  . pravastatin (PRAVACHOL) 40 MG tablet TAKE ONE TABLET BY MOUTH AT BEDTIME 90 tablet 3 06/25/2014 at Unknown time  . Multiple Vitamins-Minerals (OCUVITE ADULT 50+ PO) Take 1 tablet by mouth daily.     Not Taking at Unknown time    Current medications: Current Facility-Administered Medications  Medication Dose Route Frequency Provider Last Rate Last Dose  . 0.9 %  sodium chloride infusion   Intravenous Continuous Shanda Howells, MD 75 mL/hr at 06/26/14 0518 75 mL/hr at 06/26/14 0518  . aspirin EC tablet 325 mg  325 mg Oral Daily Shanda Howells, MD      . atenolol (TENORMIN) tablet 50 mg  50 mg Oral Daily Shanda Howells, MD      . heparin injection 5,000 Units  5,000 Units Subcutaneous 3 times per day Shanda Howells, MD   5,000 Units at 06/26/14 0517  . hydrochlorothiazide (MICROZIDE) capsule 12.5 mg  12.5 mg Oral Daily Shanda Howells, MD      . morphine 2 MG/ML injection  1-2 mg  1-2 mg Intravenous Q3H PRN Shanda Howells, MD      . multivitamin with minerals tablet 1 tablet  1 tablet Oral Daily Shanda Howells, MD      . nitroGLYCERIN (NITROSTAT) SL tablet 0.4 mg  0.4 mg Sublingual Q5 min PRN Shanda Howells, MD      . ondansetron [facility]) tablet 4 mg  4 mg Oral Q6H PRN Shanda Howells, MD       Or  . ondansetron [facility]) injection 4 mg  4 mg Intravenous Q6H PRN Shanda Howells, MD      . pravastatin (PRAVACHOL) tablet 40 mg  40 mg Oral q1800 Shanda Howells, MD      . sodium chloride 0.9 % injection 3 mL  3 mL Intravenous Q12H Shanda Howells, MD   3 mL at 06/26/14 0519     Allergies  Allergen Reactions  . Penicillins     REACTION: rash  . Tetracycline     REACTION: rash     Past Medical History  Diagnosis Date  . Shortness of breath   . Palpitations   . Unspecified essential hypertension   . Other and unspecified hyperlipidemia   . Anxiety state, unspecified     . Osteoarthrosis, unspecified whether generalized or localized, unspecified site   . Disorder of bone and cartilage, unspecified     Past Surgical History  Procedure Laterality Date  . Total abdominal hysterectomy  2001  . Bilateral salpingoophorectomy  2001  . Tonsillectomy  1957  . Colonoscopy  [id number]    Family History  Problem Relation Age of Onset  . Pneumonia Mother   . Asthma    . Allergies    . Colon cancer Neg Hx   . Stomach cancer Neg Hx     Social History:  reports that she has never smoked. She has never used smokeless tobacco. She reports that she does not drink alcohol or use illicit drugs.   Review of Systems: Constitutional:  denies fever, chills, diaphoresis, appetite change and fatigue.  HEENT: denies photophobia, eye pain, redness, hearing loss, ear pain, congestion, sore throat, rhinorrhea, sneezing, neck pain, neck stiffness and tinnitus.  Respiratory: denies SOB, DOE, cough, chest tightness, and wheezing.  Cardiovascular: admits to chest pain, / heaviness,  She denies  palpitations and leg swelling.  Gastrointestinal: denies nausea, vomiting, abdominal pain, diarrhea, constipation, blood in stool.  Genitourinary: denies dysuria, urgency, frequency, hematuria, flank pain and difficulty urinating.  Musculoskeletal: denies  myalgias, back pain, joint swelling, arthralgias and gait problem.   Skin: denies pallor, rash and wound.  Neurological: denies dizziness, seizures, syncope, weakness, light-headedness, numbness and headaches.   Hematological: denies adenopathy, easy bruising, personal or family bleeding history.  Psychiatric/ Behavioral: denies suicidal ideation, mood changes, confusion, nervousness, sleep disturbance and agitation.    Physical Exam: BP 132/68 mmHg  Pulse 61  Temp(Src) 98.3 F (36.8 C) (Oral)  Resp 19  Ht 5\' 1"  (1.549 m)  Wt 139 lb 8 oz (63.277 kg)  BMI 26.37 kg/m2  SpO2 100%  Wt Readings from Last 3 Encounters:  06/26/14  139 lb 8 oz (63.277 kg)  05/30/14 138 lb (62.596 kg)  11/27/13 137 lb (62.143 kg)    General: Vital signs reviewed and noted. Well-developed, well-nourished, in no acute distress; alert,   Head: Normocephalic, atraumatic, sclera anicteric,   Neck: Supple. Negative for carotid bruits. No JVD   Lungs:  Clear bilaterally, no  wheezes, rales, or rhonchi. Breathing is normal   Heart: RRR  with S1 S2. No murmurs, rubs, or gallops   Abdomen/ GI :  Soft, non-tender, non-distended with normoactive bowel sounds. No hepatomegaly. No rebound/guarding. No obvious abdominal masses   MSK: Strength and the appear normal for age.   Extremities: No clubbing or cyanosis. No edema.  Distal pedal pulses are 2+ and equal   Neurologic:  CN are grossly intact,  No obvious motor or sensory defect.  Alert and oriented X 3. Moves all extremities spontaneously.  Psych: Responds to questions appropriately with a normal affect.     Lab results: Basic Metabolic Panel:  Recent Labs Lab 06/25/14 2102 06/26/14 0219 06/26/14 0223  NA 137  --  138  K 3.8  --  3.7  CL 103  --  105  CO2 25  --  26  GLUCOSE 95  --  98  BUN 14  --  12  CREATININE 0.77 0.79 0.76  CALCIUM 9.6  --  9.3    Liver Function Tests:  Recent Labs Lab 06/26/14 0223  AST 31  ALT 32  ALKPHOS 46  BILITOT 0.9  PROT 6.7  ALBUMIN 4.1   No results for input(s): LIPASE, AMYLASE in the last 168 hours. No results for input(s): AMMONIA in the last 168 hours.  CBC:  Recent Labs Lab 06/25/14 2102 06/26/14 0219 06/26/14 0223  WBC 8.9 7.1 7.2  NEUTROABS  --   --  4.3  HGB 13.4 13.7 13.5  HCT 38.5 39.6 38.8  MCV 89.3 88.8 88.8  PLT 242 232 231    Cardiac Enzymes:  Recent Labs Lab 06/26/14 0219  TROPONINI <0.03    BNP: Invalid input(s): POCBNP  CBG: No results for input(s): GLUCAP in the last 168 hours.  Coagulation Studies: No results for input(s): LABPROT, INR in the last 72 hours.   Other results:  Personal review  of EKG shows :  - NSR , No ST or T wave changes.    Imaging: Dg Chest 2 View  06/26/2014   CLINICAL DATA:  Left-sided chest discomfort. Diaphoresis. Symptoms began this evening.  EXAM: CHEST  2 VIEW  COMPARISON:  07/28/2007  FINDINGS: Lower lung volumes from prior exam. The heart remains at the upper limits of normal in size. Pulmonary vasculature is normal. There is no consolidation, pleural effusion, or pneumothorax. Stable degenerative change at the right acromioclavicular joint.  IMPRESSION: No acute pulmonary process, stable borderline cardiomegaly.   Electronically Signed   By: Jeb Levering M.D.   On: 06/26/2014 00:24     Assessment & Plan:  1.  Chest tightness / left shoulder heaviness:  Symptoms are worrisome for unstable angina . Given the fact that she has had these symptoms with little to no exertion since her initial episode, I think we should consider cardiac cath.  She ate a little bit of breakfast this am so an myoview would need to wait until tomorrow.   I've discussed the risks, benefits, options with her and her daughter.  She understands and agress to proceed.   3. Hypertension:  Continue current meds  3.  Hyperlipidemia:  LDL is 112.  Continue pravachol 40 for now.  Consider changing to atorvastatin if she is found to have CAD.   Thayer Headings, Brooke Bonito., MD, [facility] 06/26/2014, 8:36 AM Office - [phone] Pager [phone]

## 2014-06-26 NOTE — H&P (Signed)
Hospitalist Admission History and Physical  Patient name: Katelyn [name] Medical record number: [id number] Date of birth: January 05, 1941 Age: 74 y.o. Gender: female  Primary Care Provider: Nyoka Cowden, MD  Chief Complaint: chest pain  History of Present Illness:This is a 74 y.o. year old female with significant past medical history of HTN, HLD  presenting with chest pain. Patient port intermittent episodes of chest discomfort, left arm pain over the past 2 weeks. Has had intermittent episodes of predominant left arm discomfort and heaviness that radiates to the neck with intermittent chest pain/heaviness. Episode happened today on her way to church. Had some exertional dyspnea with this as well. Exertion seems also worsen left arm heaviness. Similar profile proximally 2 weeks ago. Patient denies any prior history of cardiac disease in the past. No recent strenuous activity. No numbness or tingling. Presents to the ER temperature 97.8, heart rate in the 50s to 70s, respirations in the tens to 20s, blood pressure in the 120s to 150s, satting 97% on room air. CBC and BMP within normal limits. Chest x-ray within normals. Troponin negative 1. EKG normal sinus rhythm. Patient did receive some aspirin. Was asymptomatic at the time of take aspirin per patient.  Heart Score: 3-4  Assessment and Plan: Katelyn [name] is a 74 y.o. year old female presenting with chest pain   Active Problems:   Chest pain   1- Chest Pain -fairly typical sxs in pt w/ + RFs including age, HTN, HLD (no significant family history) -trop neg x1 -EKG NSR -cycle CEs -risk stratification labs -tele bed -full dose ASA -prn NTG   2- HTN -stable  -cont home regimen   3-HLD -cont statin  -check lipid panel  FEN/GI: heart healthy diet  Prophylaxis: sub q heparin  Disposition: pending further evaluation  Code Status:Full Code    Patient Active Problem List   Diagnosis Date Noted  . Chest pain  06/26/2014  . GERD (gastroesophageal reflux disease) 03/13/2011  . DEGENERATIVE JOINT DISEASE, MILD 08/28/2007  . OSTEOPENIA 08/28/2007  . ANXIETY 08/25/2007  . PALPITATIONS 08/25/2007  . Dyslipidemia 07/28/2007  . Essential hypertension 07/28/2007  . DYSPNEA 07/28/2007   Past Medical History: Past Medical History  Diagnosis Date  . Shortness of breath   . Palpitations   . Unspecified essential hypertension   . Other and unspecified hyperlipidemia   . Anxiety state, unspecified   . Osteoarthrosis, unspecified whether generalized or localized, unspecified site   . Disorder of bone and cartilage, unspecified     Past Surgical History: Past Surgical History  Procedure Laterality Date  . Total abdominal hysterectomy  2001  . Bilateral salpingoophorectomy  2001  . Tonsillectomy  1957  . Colonoscopy  [id number]    Social History: History   Social History  . Marital Status: Married    Spouse Name: N/A    Number of Children: N/A  . Years of Education: N/A   Social History Main Topics  . Smoking status: Never Smoker   . Smokeless tobacco: Never Used  . Alcohol Use: No  . Drug Use: No  . Sexual Activity: None   Other Topics Concern  . None   Social History Narrative    Family History: Family History  Problem Relation Age of Onset  . Pneumonia Mother   . Asthma    . Allergies    . Colon cancer Neg Hx   . Stomach cancer Neg Hx     Allergies: Allergies  Allergen Reactions  . Penicillins  REACTION: rash  . Tetracycline     REACTION: rash    Current Facility-Administered Medications  Medication Dose Route Frequency Provider Last Rate Last Dose  . 0.9 %  sodium chloride infusion   Intravenous Continuous Shanda Howells, MD      . aspirin EC tablet 325 mg  325 mg Oral Daily Shanda Howells, MD      . atenolol (TENORMIN) tablet 50 mg  50 mg Oral Daily Shanda Howells, MD      . heparin injection 5,000 Units  5,000 Units Subcutaneous 3 times per day Shanda Howells, MD      . hydrochlorothiazide (HYDRODIURIL) tablet 12.5 mg  12.5 mg Oral Daily Shanda Howells, MD      . morphine 2 MG/ML injection 1-2 mg  1-2 mg Intravenous Q3H PRN Shanda Howells, MD      . multivitamin with minerals tablet 1 tablet  1 tablet Oral Daily Shanda Howells, MD      . nitroGLYCERIN (NITROSTAT) SL tablet 0.4 mg  0.4 mg Sublingual Q5 min PRN Shanda Howells, MD      . ondansetron [facility]) tablet 4 mg  4 mg Oral Q6H PRN Shanda Howells, MD       Or  . ondansetron [facility]) injection 4 mg  4 mg Intravenous Q6H PRN Shanda Howells, MD      . pravastatin (PRAVACHOL) tablet 40 mg  40 mg Oral q1800 Shanda Howells, MD      . sodium chloride 0.9 % injection 3 mL  3 mL Intravenous Q12H Shanda Howells, MD       Current Outpatient Prescriptions  Medication Sig Dispense Refill  . aspirin 81 MG EC tablet Take 81 mg by mouth daily.      Marland Kitchen atenolol (TENORMIN) 50 MG tablet TAKE ONE TABLET BY MOUTH TWICE DAILY 180 tablet 1  . Calcium Citrate-Vitamin D (CITRACAL PETITES/VITAMIN D PO) Take 1 tablet by mouth daily.      . Cholecalciferol (VITAMIN D3) 2000 UNITS TABS Take 1 tablet by mouth daily.      . Cyanocobalamin (VITAMIN B-12 PO) Take 1 tablet by mouth daily.    Marland Kitchen estradiol (VIVELLE-DOT) 0.075 MG/24HR Place 1 patch onto the skin 2 (two) times a week. Sunday and Wednesday    . hydrochlorothiazide (HYDRODIURIL) 12.5 MG tablet TAKE ONE TABLET BY MOUTH ONCE DAILY 90 tablet 3  . Multiple Vitamin (MULTIVITAMIN WITH MINERALS) TABS tablet Take 1 tablet by mouth daily.    . potassium chloride (KLOR-CON M10) 10 MEQ tablet TAKE TWO TABLETS BY MOUTH EVERY DAY 180 tablet 3  . pravastatin (PRAVACHOL) 40 MG tablet TAKE ONE TABLET BY MOUTH AT BEDTIME 90 tablet 3  . Multiple Vitamins-Minerals (OCUVITE ADULT 50+ PO) Take 1 tablet by mouth daily.      . [DISCONTINUED] potassium chloride (K-DUR) 10 MEQ tablet Take 2 capsules daily 180 tablet 3   Review Of Systems: 12 point ROS negative except as noted above in  HPI.  Physical Exam: Filed Vitals:   06/26/14 0115  BP: 127/62  Pulse: 58  Temp:   Resp: 11    General: alert and cooperative HEENT: PERRLA and extra ocular movement intact Heart: S1, S2 normal, no murmur, rub or gallop, regular rate and rhythm Lungs: clear to auscultation, no wheezes or rales and unlabored breathing Abdomen: abdomen is soft without significant tenderness, masses, organomegaly or guarding Extremities: extremities normal, atraumatic, no cyanosis or edema Skin:no rashes Neurology: normal without focal findings  Labs and Imaging: Lab Results  Component Value Date/Time   NA 137 06/25/2014 09:02 PM   K 3.8 06/25/2014 09:02 PM   CL 103 06/25/2014 09:02 PM   CO2 25 06/25/2014 09:02 PM   BUN 14 06/25/2014 09:02 PM   CREATININE 0.77 06/25/2014 09:02 PM   GLUCOSE 95 06/25/2014 09:02 PM   Lab Results  Component Value Date   WBC 8.9 06/25/2014   HGB 13.4 06/25/2014   HCT 38.5 06/25/2014   MCV 89.3 06/25/2014   PLT 242 06/25/2014    Dg Chest 2 View  06/26/2014   CLINICAL DATA:  Left-sided chest discomfort. Diaphoresis. Symptoms began this evening.  EXAM: CHEST  2 VIEW  COMPARISON:  07/28/2007  FINDINGS: Lower lung volumes from prior exam. The heart remains at the upper limits of normal in size. Pulmonary vasculature is normal. There is no consolidation, pleural effusion, or pneumothorax. Stable degenerative change at the right acromioclavicular joint.  IMPRESSION: No acute pulmonary process, stable borderline cardiomegaly.   Electronically Signed   By: Jeb Levering M.D.   On: 06/26/2014 00:24           Shanda Howells MD  Pager: [phone]

## 2014-06-26 NOTE — Progress Notes (Signed)
Cath report noted. Nonobtructive CAD. Dr. Acie Fredrickson updated on result. Possible discharge tomorrow by IM if stable. I will arrange outpatient followup with Dr. Acie Fredrickson  Signed, Almyra Deforest PA Pager: [phone]

## 2014-06-26 NOTE — CV Procedure (Signed)
CARDIAC CATHETERIZATION REPORT  NAME:  Katelyn [name]   MRN: [id number] DOB:  [birth year]   ADMIT DATE: 06/25/2014 Procedure Date: 06/26/2014  INTERVENTIONAL CARDIOLOGIST: Leonie Man, M.D., MS PRIMARY CARE PROVIDER: Nyoka Cowden, MD PRIMARY CARDIOLOGIST: Mertie Moores, M.D.  PATIENT:  Katelyn [name] is a 74 y.o. female with history of hypertension, hyperlipidemia but is an active exerciser. She is working out of [employer] and started developing exertional chest discomfort and heaviness. It is now happening more frequently with warm severe intensity associated with diaphoresis. The symptoms were concerning for possible crescendo unstable angina therefore she is referred for invasive evaluation after presenting to [facility] emergency room this morning.  PRE-OPERATIVE DIAGNOSIS:    Unstable Angina  PROCEDURES PERFORMED:    Left Heart Catheterization with Native Coronary Angiography  via Right Radial Artery   Left Ventriculography  PROCEDURE: The patient was brought to the [facility] [place] [facility] Lab in the fasting state and prepped and draped in the usual sterile fashion for right radial artery access. A modified Allen's test was performed on the right wrist demonstrating excellent collateral flow for radial access.   Sterile technique was used including antiseptics, cap, gloves, gown, hand hygiene, mask and sheet. Skin prep: Chlorhexidine.   Consent: Risks of procedure as well as the alternatives and risks of each were explained to the (patient/caregiver). Consent for procedure obtained.   Time Out: Verified patient identification, verified procedure, site/side was marked, verified correct patient position, special equipment/implants available, medications/allergies/relevent history reviewed, required imaging and test results available. Performed.  Access:   Right Radial Artery: 6 Fr Sheath -  Seldinger Technique (Angiocath Micropuncture  Kit)  Radial Cocktail - 10 mL; IV Heparin 3500 Units   Left Heart Catheterization: 5 Fr Catheters advanced over a Versicore wire and exchanged over a long exchange safety J-wire; TIG 4.0 catheter advanced first.  Right Coronary Artery Cineangiography: TIG 4.0 Catheter  Left Coronary Artery Cineangiography: JL 35 Catheter   LV Hemodynamics (LV Gram): Angled pigtail  Sheath removed in the cardiac catheterization lab with TR band placement for hemostasis.  TR Band: 1635  Hours; 12 mL air  FINDINGS:  Hemodynamics:   Central Aortic Pressure / Mean: 120/76/93 mmHg  Left Ventricular Pressure / LVEDP: 117/3/9 mmHg  Left Ventriculography:  EF: 65-70 %  Wall Motion: Hyperdynamic  Coronary Anatomy:  Dominance: Right  Left Main: Normal caliber vessel that bifurcates into the LAD and Circumflex. Angiographically normal. LAD: Normal caliber vessel that bifurcates in the mid segment into 2 small to moderate caliber branches as the D1 and distal LAD. There is a eccentric 30-40% lesion in the LAD at this area and a 30% ostial diagonal lesion. The vessels are probably just about 2 mm in diameter in the distal portion of the LAD system.  Left Circumflex: Normal caliber vessel that gives off a high proximal OM1 before terminating is essentially a lateral OM 2 a tiny AV groove circumflex.  OM1: Moderate caliber vessel that bifurcates into a small and medium-size branch appeared somewhat tortuous with mild proximal luminal irregularities.  OM 2: Moderate large-caliber vessel that courses down along the inferolateral wall to the apex. Tortuous distally but no significant disease.   RCA: Large-caliber, dominant vessel with a proximal roughly 20% focal lesion. Otherwise essentially normal was and continues distally to bifurcate into the Right Posterior AV Groove Branch (RPAV) and the Right Posterior Descending Artery (RPDA).  RPDA: moderate caliber vessel that reaches all the way down  to the apex  perfusing almost the distal portion of the anteroapex. Tortuous but free of disease.  RPL Sysytem:The RPAV begins as a moderate large-caliber vessel that bifurcates into 2 small moderate caliber posterolateral branches. Angiographically normal.   MEDICATIONS:  Anesthesia:  Local Lidocaine 2  ml  Sedation: 1 mg Versed,25 mcg IV fentanyl ;   Omnipaque Contrast:  8m  Anticoagulation:  IV Heparin  3500 Units  Radial Cocktail: 5 mg Verapamil, 400 mcg NTG, 2 ml 2% Lidocaine in 10 ml NS  PATIENT DISPOSITION:    The patient was transferred to the PACU holding area in a hemodynamicaly stable, chest pain free condition.  The patient tolerated the procedure well, and there were no complications.  EBL:   <  10 ml  The patient was stable before, during, and after the procedure.  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 Moderate mid LAD disease would probably be better treated medically given the size of the vessel.  Hyperdynamic left ventricle function with normal LVEDP  Likely nonanginal chest pain  PLAN OF CARE:  Return to nursing unit for standard post radial cath care.  Medical management for moderate CAD with risk factor modification.  Okay to discharge from cardiology standpoint following bedrest.  [place] happy to arrange follow-up with Dr. NTana Coast Briggette Najarian WViona Gilmore M.D., M.S. Interventional Cardiologist   Pager # [phone]

## 2014-06-27 ENCOUNTER — Encounter (HOSPITAL_COMMUNITY): Payer: Self-pay | Admitting: Cardiology

## 2014-06-27 DIAGNOSIS — I200 Unstable angina: Secondary | ICD-10-CM

## 2014-06-27 LAB — CBC WITH DIFFERENTIAL/PLATELET
Basophils Absolute: 0 10*3/uL (ref 0.0–0.1)
Basophils Relative: 0 % (ref 0–1)
EOS ABS: 0.2 10*3/uL (ref 0.0–0.7)
Eosinophils Relative: 2 % (ref 0–5)
HEMATOCRIT: 37.6 % (ref 36.0–46.0)
HEMOGLOBIN: 12.8 g/dL (ref 12.0–15.0)
LYMPHS PCT: 27 % (ref 12–46)
Lymphs Abs: 2 10*3/uL (ref 0.7–4.0)
MCH: 30.6 pg (ref 26.0–34.0)
MCHC: 34 g/dL (ref 30.0–36.0)
MCV: 90 fL (ref 78.0–100.0)
Monocytes Absolute: 0.6 10*3/uL (ref 0.1–1.0)
Monocytes Relative: 9 % (ref 3–12)
Neutro Abs: 4.6 10*3/uL (ref 1.7–7.7)
Neutrophils Relative %: 62 % (ref 43–77)
Platelets: 218 10*3/uL (ref 150–400)
RBC: 4.18 MIL/uL (ref 3.87–5.11)
RDW: 12.8 % (ref 11.5–15.5)
WBC: 7.4 10*3/uL (ref 4.0–10.5)

## 2014-06-27 LAB — COMPREHENSIVE METABOLIC PANEL
ALBUMIN: 3.5 g/dL (ref 3.5–5.2)
ALT: 29 U/L (ref 0–35)
AST: 27 U/L (ref 0–37)
Alkaline Phosphatase: 45 U/L (ref 39–117)
Anion gap: 8 (ref 5–15)
BUN: 15 mg/dL (ref 6–23)
CO2: 26 mmol/L (ref 19–32)
Calcium: 8.5 mg/dL (ref 8.4–10.5)
Chloride: 107 mmol/L (ref 96–112)
Creatinine, Ser: 0.76 mg/dL (ref 0.50–1.10)
GFR calc non Af Amer: 82 mL/min — ABNORMAL LOW (ref >90)
GLUCOSE: 96 mg/dL (ref 70–99)
POTASSIUM: 4 mmol/L (ref 3.5–5.1)
Sodium: 141 mmol/L (ref 135–145)
Total Bilirubin: 0.6 mg/dL (ref 0.3–1.2)
Total Protein: 6.2 g/dL (ref 6.0–8.3)

## 2014-06-27 LAB — HEMOGLOBIN A1C
HEMOGLOBIN A1C: 5.6 % (ref 4.8–5.6)
MEAN PLASMA GLUCOSE: 114 mg/dL

## 2014-06-27 NOTE — Discharge Summary (Signed)
Katelyn [name], 74 y.o., DOB August 23, 1940, MRN [id number]. Admission date: 06/25/2014 Discharge Date 06/27/2014 Primary MD Nyoka Cowden, MD Admitting Physician Shanda Howells, MD  Admission Diagnosis  Diaphoresis [R61]  Discharge Diagnosis   Principal Problem:   Unstable angina Active Problems:   Chest pain      Past Medical History  Diagnosis Date  . Shortness of breath   . Palpitations   . Unspecified essential hypertension   . Other and unspecified hyperlipidemia   . Anxiety state, unspecified   . Osteoarthrosis, unspecified whether generalized or localized, unspecified site   . Disorder of bone and cartilage, unspecified     Past Surgical History  Procedure Laterality Date  . Total abdominal hysterectomy  2001  . Bilateral salpingoophorectomy  2001  . Tonsillectomy  1957  . Colonoscopy  1443,1540  . Left heart catheterization with coronary angiogram N/A 06/26/2014    Procedure: LEFT HEART CATHETERIZATION WITH CORONARY ANGIOGRAM;  Surgeon: Leonie Man, MD;  [facility]: [facility] C[facility] LAB;  Service: Cardiovascular;  Laterality: N/A;     Hospital Course See H&P, Labs, Consult and Test reports for all details in brief, patient was admitted for **  Principal Problem:   Unstable angina Active Problems:   Chest pain History of Present Illness:This is a 74 y.o. year old female with significant past medical history of HTN, HLD presenting with chest pain. Patient port intermittent episodes of chest discomfort, left arm pain over the past 2 weeks. Has had intermittent episodes of predominant left arm discomfort and heaviness that radiates to the neck with intermittent chest pain/heaviness. With some exertional dyspnea  as well. . Patient denies any prior history of cardiac disease in the past. No recent strenuous activity. No numbness or tingling. Patient was seen by cardiology, had cardiac cath done on 2/2, with findings suggestive of nonobstructive coronary artery disease. With  EF 65-70 %.  Chest pain - Cardiac cath 06/26/14, showing moderate nonobstructive coronary artery disease, mentation is for medical management, continue with aspirin, sublingual nitroglycerin as needed, statin , atenolol. - Follow-up with cardiology Dr. Acie Fredrickson as an outpatient.  Hypertension -Continue home medication  Hyperlipidemia -Continue with statin  Consults   Cardiology Procedures Cardiac cath 06/26/14 showing moderate obstructive coronary artery disease.  Significant Tests:  See full reports for all details    Dg Chest 2 View  06/26/2014   CLINICAL DATA:  Left-sided chest discomfort. Diaphoresis. Symptoms began this evening.  EXAM: CHEST  2 VIEW  COMPARISON:  07/28/2007  FINDINGS: Lower lung volumes from prior exam. The heart remains at the upper limits of normal in size. Pulmonary vasculature is normal. There is no consolidation, pleural effusion, or pneumothorax. Stable degenerative change at the right acromioclavicular joint.  IMPRESSION: No acute pulmonary process, stable borderline cardiomegaly.   Electronically Signed   By: Jeb Levering M.D.   On: 06/26/2014 00:24     Today   Subjective:   Katelyn [name] today has no headache,no chest abdominal pain,no new weakness tingling or numbness, feels much better wants to go home today.  Objective:   Blood pressure 110/74, pulse 75, temperature 98.2 F (36.8 C), temperature source Oral, resp. rate 18, height 5\' 1"  (1.549 m), weight 63.277 kg (139 lb 8 oz), SpO2 96 %.  Intake/Output Summary (Last 24 hours) at 06/27/14 1122 Last data filed at 06/27/14 0939  Gross per 24 hour  Intake    360 ml  Output      0 ml  Net    360  ml    Exam Awake Alert, Oriented *3, No new F.N deficits, Normal affect [place].AT,PERRAL Supple Neck,No JVD, No cervical lymphadenopathy appriciated.  Symmetrical Chest wall movement, Good air movement bilaterally, CTAB RRR,No Gallops,Rubs or new Murmurs, No Parasternal Heave +ve B.Sounds, Abd Soft,  Non tender, No organomegaly appriciated, No rebound -guarding or rigidity. No Cyanosis, Clubbing or edema, No new Rash or bruise  Data Review   Cultures -   CBC w Diff: Lab Results  Component Value Date   WBC 7.4 06/27/2014   HGB 12.8 06/27/2014   HCT 37.6 06/27/2014   PLT 218 06/27/2014   LYMPHOPCT 27 06/27/2014   MONOPCT 9 06/27/2014   EOSPCT 2 06/27/2014   BASOPCT 0 06/27/2014   CMP: Lab Results  Component Value Date   NA 141 06/27/2014   K 4.0 06/27/2014   CL 107 06/27/2014   CO2 26 06/27/2014   BUN 15 06/27/2014   CREATININE 0.76 06/27/2014   PROT 6.2 06/27/2014   ALBUMIN 3.5 06/27/2014   BILITOT 0.6 06/27/2014   ALKPHOS 45 06/27/2014   AST 27 06/27/2014   ALT 29 06/27/2014  .  Micro Results No results found for this or any previous visit (from the past 240 hour(s)).   Discharge Instructions      Follow-up Information    Follow up with Nyoka Cowden, MD. Schedule an appointment as soon as possible for a visit in 1 week.   Specialty:  Internal Medicine   Contact information:   [place] [address] [phone]       Follow up with Nahser, Wonda Cheng, MD.   Specialty:  Cardiology   Why:  follow up will be arranged by cardiology.   Contact information:   [address] [place] [address] [phone]       Discharge Medications     Medication List    STOP taking these medications        OCUVITE ADULT 50+ PO      TAKE these medications        aspirin 81 MG EC tablet  Take 81 mg by mouth daily.     atenolol 50 MG tablet  Commonly known as:  TENORMIN  TAKE ONE TABLET BY MOUTH TWICE DAILY     CITRACAL PETITES/VITAMIN D PO  Take 1 tablet by mouth daily.     estradiol 0.075 MG/24HR  Commonly known as:  VIVELLE-DOT  Place 1 patch onto the skin 2 (two) times a week. Sunday and Wednesday     hydrochlorothiazide 12.5 MG tablet  Commonly known as:  HYDRODIURIL  TAKE ONE TABLET BY MOUTH ONCE  DAILY     multivitamin with minerals Tabs tablet  Take 1 tablet by mouth daily.     potassium chloride 10 MEQ tablet  Commonly known as:  KLOR-CON M10  TAKE TWO TABLETS BY MOUTH EVERY DAY     pravastatin 40 MG tablet  Commonly known as:  PRAVACHOL  TAKE ONE TABLET BY MOUTH AT BEDTIME     VITAMIN B-12 PO  Take 1 tablet by mouth daily.     Vitamin D3 2000 UNITS Tabs  Take 1 tablet by mouth daily.         Total Time in preparing paper work, data evaluation and todays exam - 35 minutes  Tashawn Greff M.D on 06/27/2014 at [facility]  [phone]

## 2014-06-27 NOTE — Discharge Instructions (Signed)
Follow with Primary MD Nyoka Cowden, MD in 7 days   Get CBC, CMP, 2 view Chest X ray checked  by Primary MD next visit.    Activity: As tolerated with Full fall precautions use walker/cane & assistance as needed   Disposition Home    Diet: Heart Healthy  , with feeding assistance and aspiration precautions as needed.  For Heart failure patients - Check your Weight same time everyday, if you gain over 2 pounds, or you develop in leg swelling, experience more shortness of breath or chest pain, call your Primary MD immediately. Follow Cardiac Low Salt Diet and 1.8 lit/day fluid restriction.   On your next visit with your primary care physician please Get Medicines reviewed and adjusted.   Please request your Prim.MD to go over all Hospital Tests and Procedure/Radiological results at the follow up, please get all Hospital records sent to your Prim MD by signing hospital release before you go home.   If you experience worsening of your admission symptoms, develop shortness of breath, life threatening emergency, suicidal or homicidal thoughts you must seek medical attention immediately by calling 911 or calling your MD immediately  if symptoms less severe.  You Must read complete instructions/literature along with all the possible adverse reactions/side effects for all the Medicines you take and that have been prescribed to you. Take any new Medicines after you have completely understood and accpet all the possible adverse reactions/side effects.   Do not drive, operating heavy machinery, perform activities at heights, swimming or participation in water activities or provide baby sitting services if your were admitted for syncope or siezures until you have seen by Primary MD or a Neurologist and advised to do so again.  Do not drive when taking Pain medications.    Do not take more than prescribed Pain, Sleep and Anxiety Medications  Special Instructions: If you have smoked or  chewed Tobacco  in the last 2 yrs please stop smoking, stop any regular Alcohol  and or any Recreational drug use.  Wear Seat belts while driving.   Please note  You were cared for by a hospitalist during your hospital stay. If you have any questions about your discharge medications or the care you received while you were in the hospital after you are discharged, you can call the unit and asked to speak with the hospitalist on call if the hospitalist that took care of you is not available. Once you are discharged, your primary care physician will handle any further medical issues. Please note that NO REFILLS for any discharge medications will be authorized once you are discharged, as it is imperative that you return to your primary care physician (or establish a relationship with a primary care physician if you do not have one) for your aftercare needs so that they can reassess your need for medications and monitor your lab values.

## 2014-07-04 ENCOUNTER — Other Ambulatory Visit: Payer: Self-pay

## 2014-07-04 DIAGNOSIS — Z1231 Encounter for screening mammogram for malignant neoplasm of breast: Secondary | ICD-10-CM

## 2014-07-10 ENCOUNTER — Ambulatory Visit (INDEPENDENT_AMBULATORY_CARE_PROVIDER_SITE_OTHER): Payer: PPO | Admitting: Internal Medicine

## 2014-07-10 ENCOUNTER — Encounter: Payer: Self-pay | Admitting: Internal Medicine

## 2014-07-10 VITALS — BP 140/88 | HR 75 | Temp 98.0°F | Resp 20 | Ht 61.0 in | Wt 142.0 lb

## 2014-07-10 DIAGNOSIS — I2510 Atherosclerotic heart disease of native coronary artery without angina pectoris: Secondary | ICD-10-CM

## 2014-07-10 DIAGNOSIS — I10 Essential (primary) hypertension: Secondary | ICD-10-CM

## 2014-07-10 DIAGNOSIS — E785 Hyperlipidemia, unspecified: Secondary | ICD-10-CM

## 2014-07-10 MED ORDER — MELOXICAM 15 MG PO TABS: 15.0000 mg | ORAL_TABLET | Freq: Every day | ORAL | Status: DC

## 2014-07-10 NOTE — Progress Notes (Signed)
Pre visit review using our clinic review tool, if applicable. No additional management support is needed unless otherwise documented below in the visit note. 

## 2014-07-10 NOTE — Patient Instructions (Signed)
Limit your sodium (Salt) intake    It is important that you exercise regularly, at least 20 minutes 3 to 4 times per week.  If you develop chest pain or shortness of breath seek  medical attention.  Return in one year for follow-up   

## 2014-07-10 NOTE — Progress Notes (Signed)
Subjective:    Patient ID: Katelyn [name], female    DOB: 1941/04/06, 74 y.o.   MRN: [id number]  HPI Admission date: 06/25/2014 Discharge Date 06/27/2014 Primary MD Nyoka Cowden, MD Admitting Physician Shanda Howells, MD  Admission Diagnosis Diaphoresis [R61]  Discharge Diagnosis Principal Problem:  Unstable angina Active Problems:  Chest pain   74 year old patient who has a history of hypertension and dyslipidemia who was admitted hospital recently for evaluation of left shoulder discomfort and heaviness associated with weakness and diaphoresis.  Cardiac catheterization revealed nonobstructive coronary artery disease.  Since her discharge she has felt well.  No recurrent symptoms.  Hospital records reviewed  Past Medical History  Diagnosis Date  . Shortness of breath   . Palpitations   . Unspecified essential hypertension   . Other and unspecified hyperlipidemia   . Anxiety state, unspecified   . Osteoarthrosis, unspecified whether generalized or localized, unspecified site   . Disorder of bone and cartilage, unspecified     History   Social History  . Marital Status: Married    Spouse Name: N/A  . Number of Children: N/A  . Years of Education: N/A   Occupational History  . Not on file.   Social History Main Topics  . Smoking status: Never Smoker   . Smokeless tobacco: Never Used  . Alcohol Use: No  . Drug Use: No  . Sexual Activity: Not on file   Other Topics Concern  . Not on file   Social History Narrative    Past Surgical History  Procedure Laterality Date  . Total abdominal hysterectomy  2001  . Bilateral salpingoophorectomy  2001  . Tonsillectomy  1957  . Colonoscopy  [id number]  . Left heart catheterization with coronary angiogram N/A 06/26/2014    Procedure: LEFT HEART CATHETERIZATION WITH CORONARY ANGIOGRAM;  Surgeon: Leonie Man, MD;  Location: [facility] CATH LAB;  Service: Cardiovascular;  Laterality: N/A;    Family History    Problem Relation Age of Onset  . Pneumonia Mother   . Asthma    . Allergies    . Colon cancer Neg Hx   . Stomach cancer Neg Hx     Allergies  Allergen Reactions  . Penicillins     REACTION: rash  . Tetracycline     REACTION: rash    Current Outpatient Prescriptions on File Prior to Visit  Medication Sig Dispense Refill  . aspirin 81 MG EC tablet Take 81 mg by mouth daily.      Marland Kitchen atenolol (TENORMIN) 50 MG tablet TAKE ONE TABLET BY MOUTH TWICE DAILY 180 tablet 1  . Calcium Citrate-Vitamin D (CITRACAL PETITES/VITAMIN D PO) Take 1 tablet by mouth daily.      . Cholecalciferol (VITAMIN D3) 2000 UNITS TABS Take 1 tablet by mouth daily.      . Cyanocobalamin (VITAMIN B-12 PO) Take 1 tablet by mouth daily.    Marland Kitchen estradiol (VIVELLE-DOT) 0.075 MG/24HR Place 1 patch onto the skin 2 (two) times a week. Sunday and Wednesday    . hydrochlorothiazide (HYDRODIURIL) 12.5 MG tablet TAKE ONE TABLET BY MOUTH ONCE DAILY 90 tablet 3  . Multiple Vitamin (MULTIVITAMIN WITH MINERALS) TABS tablet Take 1 tablet by mouth daily.    . potassium chloride (KLOR-CON M10) 10 MEQ tablet TAKE TWO TABLETS BY MOUTH EVERY DAY 180 tablet 3  . pravastatin (PRAVACHOL) 40 MG tablet TAKE ONE TABLET BY MOUTH AT BEDTIME 90 tablet 3  . [DISCONTINUED] potassium chloride (K-DUR) 10 MEQ tablet Take 2  capsules daily 180 tablet 3   No current facility-administered medications on file prior to visit.    BP 140/88 mmHg  Pulse 75  Temp(Src) 98 F (36.7 C) (Oral)  Resp 20  Ht 5\' 1"  (1.549 m)  Wt 142 lb (64.411 kg)  BMI 26.84 kg/m2  SpO2 98%        Review of Systems  Constitutional: Negative for fever, appetite change, fatigue and unexpected weight change.  HENT: Negative for congestion, dental problem, ear pain, hearing loss, mouth sores, nosebleeds, sinus pressure, sore throat, tinnitus, trouble swallowing and voice change.   Eyes: Negative for photophobia, pain, redness and visual disturbance.  Respiratory: Negative  for cough, chest tightness and shortness of breath.   Cardiovascular: Negative for chest pain, palpitations and leg swelling.  Gastrointestinal: Negative for nausea, vomiting, abdominal pain, diarrhea, constipation, blood in stool, abdominal distention and rectal pain.  Genitourinary: Negative for dysuria, urgency, frequency, hematuria, flank pain, vaginal bleeding, vaginal discharge, difficulty urinating, genital sores, vaginal pain, menstrual problem and pelvic pain.  Musculoskeletal: Positive for neck pain and neck stiffness. Negative for back pain and arthralgias.  Skin: Negative for rash.  Neurological: Negative for dizziness, syncope, speech difficulty, weakness, light-headedness, numbness and headaches.  Hematological: Negative for adenopathy. Does not bruise/bleed easily.  Psychiatric/Behavioral: Negative for suicidal ideas, behavioral problems, self-injury, dysphoric mood and agitation. The patient is not nervous/anxious.        Objective:   Physical Exam  Constitutional: She is oriented to person, place, and time. She appears well-developed and well-nourished.  HENT:  Head: Normocephalic.  Right Ear: External ear normal.  Left Ear: External ear normal.  Mouth/Throat: Oropharynx is clear and moist.  Eyes: Conjunctivae and EOM are normal. Pupils are equal, round, and reactive to light.  Neck: Normal range of motion. Neck supple. No thyromegaly present.  Cardiovascular: Normal rate, regular rhythm, normal heart sounds and intact distal pulses.   Right radial pulse, normal and side of right radial artery catheterization  Pulmonary/Chest: Effort normal and breath sounds normal.  Abdominal: Soft. Bowel sounds are normal. She exhibits no mass. There is no tenderness.  Musculoskeletal: Normal range of motion.  Lymphadenopathy:    She has no cervical adenopathy.  Neurological: She is alert and oriented to person, place, and time.  Skin: Skin is warm and dry. No rash noted.    Psychiatric: She has a normal mood and affect. Her behavior is normal.          Assessment & Plan:  Cervical disc disease.  Suspect left shoulder discomfort may be referred pain from cervical disc disease.  Doubt unstable angina Nonobstructive coronary artery disease.  Will continue aggressive risk factor modification Dyslipidemia Hypertension, stable

## 2014-07-23 ENCOUNTER — Ambulatory Visit
Admission: RE | Admit: 2014-07-23 | Discharge: 2014-07-23 | Disposition: A | Discharge status: Home / Self Care | Payer: PPO | Source: Ambulatory Visit

## 2014-07-23 DIAGNOSIS — Z1231 Encounter for screening mammogram for malignant neoplasm of breast: Secondary | ICD-10-CM

## 2014-07-25 ENCOUNTER — Ambulatory Visit (INDEPENDENT_AMBULATORY_CARE_PROVIDER_SITE_OTHER): Payer: PPO | Admitting: Cardiovascular Disease

## 2014-07-25 ENCOUNTER — Encounter: Payer: Self-pay | Admitting: Cardiovascular Disease

## 2014-07-25 VITALS — BP 112/64 | HR 70 | Ht 61.0 in | Wt 135.0 lb

## 2014-07-25 DIAGNOSIS — E785 Hyperlipidemia, unspecified: Secondary | ICD-10-CM

## 2014-07-25 DIAGNOSIS — I2510 Atherosclerotic heart disease of native coronary artery without angina pectoris: Secondary | ICD-10-CM

## 2014-07-25 NOTE — Progress Notes (Signed)
Cardiology Office Note   Date:  07/25/2014   ID:  [name], DOB 1940-08-23, MRN [id number]  PCP:  Katelyn Cowden, MD  Cardiologist:   Katelyn Headings, MD   No chief complaint on file.  1. Chest pain / left shoulder pain    History of Present Illness: Katelyn [name] is a 74 y.o. female who presents for follow-up evaluation following hospitalization for some left shoulder pain/chest pain. She had a cardiac catheterization which revealed minor coronary artery irregularities. She's done well since that time. Her right radial cath site has healed nicely.  She has not had any significant CP since the cath.    Past Medical History  Diagnosis Date  . Shortness of breath   . Palpitations   . Unspecified essential hypertension   . Other and unspecified hyperlipidemia   . Anxiety state, unspecified   . Osteoarthrosis, unspecified whether generalized or localized, unspecified site   . Disorder of bone and cartilage, unspecified     Past Surgical History  Procedure Laterality Date  . Total abdominal hysterectomy  2001  . Bilateral salpingoophorectomy  2001  . Tonsillectomy  1957  . Colonoscopy  [id number]  . Left heart catheterization with coronary angiogram N/A 06/26/2014    Procedure: LEFT HEART CATHETERIZATION WITH CORONARY ANGIOGRAM;  Surgeon: Katelyn Man, MD;  Location: [facility] CATH LAB;  Service: Cardiovascular;  Laterality: N/A;     Current Outpatient Prescriptions  Medication Sig Dispense Refill  . aspirin 81 MG EC tablet Take 81 mg by mouth daily.      Marland Kitchen atenolol (TENORMIN) 50 MG tablet TAKE ONE TABLET BY MOUTH TWICE DAILY 180 tablet 1  . Calcium Citrate-Vitamin D (CITRACAL PETITES/VITAMIN D PO) Take 1 tablet by mouth daily.      . Cholecalciferol (VITAMIN D3) 2000 UNITS TABS Take 1 tablet by mouth daily.      . Cyanocobalamin (VITAMIN B-12 PO) Take 1 tablet by mouth daily.    Marland Kitchen estradiol (VIVELLE-DOT) 0.075 MG/24HR Place 1 patch onto the skin 2 (two)  times a week. Sunday and Wednesday    . hydrochlorothiazide (HYDRODIURIL) 12.5 MG tablet TAKE ONE TABLET BY MOUTH ONCE DAILY 90 tablet 3  . meloxicam (MOBIC) 15 MG tablet Take 1 tablet (15 mg total) by mouth daily. 60 tablet 2  . Multiple Vitamin (MULTIVITAMIN WITH MINERALS) TABS tablet Take 1 tablet by mouth daily.    . potassium chloride (KLOR-CON M10) 10 MEQ tablet TAKE TWO TABLETS BY MOUTH EVERY DAY 180 tablet 3  . pravastatin (PRAVACHOL) 40 MG tablet TAKE ONE TABLET BY MOUTH AT BEDTIME 90 tablet 3  . [DISCONTINUED] potassium chloride (K-DUR) 10 MEQ tablet Take 2 capsules daily 180 tablet 3   No current facility-administered medications for this visit.    Allergies:   Penicillins and Tetracycline    Social History:  The patient  reports that she has never smoked. She has never used smokeless tobacco. She reports that she does not drink alcohol or use illicit drugs.   Family History:  The patient's family history includes Allergies in an other family member; Asthma in an other family member; Pneumonia in her mother. There is no history of Colon cancer or Stomach cancer.    ROS:  Please see the history of present illness.    Review of Systems: Constitutional:  denies fever, chills, diaphoresis, appetite change and fatigue.  HEENT: denies photophobia, eye pain, redness, hearing loss, ear pain, congestion, sore throat, rhinorrhea, sneezing, neck pain, neck  stiffness and tinnitus.  Respiratory: denies SOB, DOE, cough, chest tightness, and wheezing.  Cardiovascular: denies chest pain, palpitations and leg swelling.  Gastrointestinal: denies nausea, vomiting, abdominal pain, diarrhea, constipation, blood in stool.  Genitourinary: denies dysuria, urgency, frequency, hematuria, flank pain and difficulty urinating.  Musculoskeletal: denies  myalgias, back pain, joint swelling, arthralgias and gait problem.   Skin: denies pallor, rash and wound.  Neurological: denies dizziness, seizures,  syncope, weakness, light-headedness, numbness and headaches.   Hematological: denies adenopathy, easy bruising, personal or family bleeding history.  Psychiatric/ Behavioral: denies suicidal ideation, mood changes, confusion, nervousness, sleep disturbance and agitation.       All other systems are reviewed and negative.    PHYSICAL EXAM: VS:  BP 112/64 mmHg  Pulse 70  Ht 5\' 1"  (1.549 m)  Wt 135 lb (61.236 kg)  BMI 25.52 kg/m2  SpO2 98% , BMI Body mass index is 25.52 kg/(m^2). GEN: Well nourished, well developed, in no acute distress HEENT: normal Neck: no JVD, carotid bruits, or masses Cardiac: RRR; no murmurs, rubs, or gallops,no edema  Respiratory:  clear to auscultation bilaterally, normal work of breathing GI: soft, nontender, nondistended, + BS MS: no deformity or atrophy, right radial cath site looks great.  Skin: warm and dry, no rash Neuro:  Strength and sensation are intact Psych: normal   EKG:  EKG is not ordered today.    Recent Labs: 06/26/2014: TSH 2.076 06/27/2014: ALT 29; BUN 15; Creatinine 0.76; Hemoglobin 12.8; Platelets 218; Potassium 4.0; Sodium 141    Lipid Panel    Component Value Date/Time   CHOL 196 06/26/2014 0219   TRIG 159* 06/26/2014 0219   HDL 52 06/26/2014 0219   CHOLHDL 3.8 06/26/2014 0219   VLDL 32 06/26/2014 0219   LDLCALC 112* 06/26/2014 0219      Wt Readings from Last 3 Encounters:  07/25/14 135 lb (61.236 kg)  07/10/14 142 lb (64.411 kg)  06/26/14 139 lb 8 oz (63.277 kg)      Other studies Reviewed: Additional studies/ records that were reviewed today include: cath note from Dr. Ellyn [name].    ASSESSMENT AND PLAN:  1.  CP the patient presented to the hospital with chest pain. She had a cardiac catheterization which revealed only minimal coronary artery irregularities. She has hyperlipidemia but otherwise no significant risk factors. We will have her follow-up with Dr. Burnice [name].   2. Mild CAD:   She has mild CAD by cath.   She does not need any follow up at this point.  She may see me as needed.    Current medicines are reviewed at length with the patient today.  The patient does not have concerns regarding medicines.  The following changes have been made:  no change   Disposition:   FU with me as neede.      Signed, Nahser, Wonda Cheng, MD  07/25/2014 11:29 AM    [facility] [place], [place], [place]  [address] Phone: [phone]; Fax: [phone]

## 2014-07-25 NOTE — Patient Instructions (Signed)
Your physician recommends that you continue on your current medications as directed. Please refer to the Current Medication list given to you today.  Your physician recommends that you schedule a follow-up appointment in: as needed with Dr. Nahser  

## 2014-08-22 ENCOUNTER — Other Ambulatory Visit: Payer: Self-pay | Admitting: Internal Medicine

## 2014-10-26 ENCOUNTER — Other Ambulatory Visit: Payer: PPO

## 2014-11-02 ENCOUNTER — Encounter: Payer: PPO | Admitting: Internal Medicine

## 2014-11-22 ENCOUNTER — Other Ambulatory Visit: Payer: Self-pay | Admitting: Internal Medicine

## 2014-11-29 ENCOUNTER — Other Ambulatory Visit: Payer: Self-pay | Admitting: Internal Medicine

## 2014-12-28 ENCOUNTER — Other Ambulatory Visit: Payer: Self-pay | Admitting: Internal Medicine

## 2015-01-01 ENCOUNTER — Other Ambulatory Visit: Payer: Self-pay | Admitting: Physician Assistant

## 2015-01-26 ENCOUNTER — Other Ambulatory Visit: Payer: Self-pay | Admitting: Internal Medicine

## 2015-02-09 ENCOUNTER — Ambulatory Visit (INDEPENDENT_AMBULATORY_CARE_PROVIDER_SITE_OTHER): Payer: PPO | Admitting: Emergency Medicine

## 2015-02-09 VITALS — BP 134/88 | HR 84 | Temp 98.2°F | Resp 16 | Ht 61.0 in | Wt 136.0 lb

## 2015-02-09 DIAGNOSIS — R059 Cough, unspecified: Secondary | ICD-10-CM

## 2015-02-09 DIAGNOSIS — R5383 Other fatigue: Secondary | ICD-10-CM

## 2015-02-09 DIAGNOSIS — R61 Generalized hyperhidrosis: Secondary | ICD-10-CM | POA: Diagnosis not present

## 2015-02-09 DIAGNOSIS — R05 Cough: Secondary | ICD-10-CM | POA: Diagnosis not present

## 2015-02-09 LAB — POCT CBC
GRANULOCYTE PERCENT: 69.1 % (ref 37–80)
HEMATOCRIT: 44 % (ref 37.7–47.9)
Hemoglobin: 14.3 g/dL (ref 12.2–16.2)
Lymph, poc: 2.3 (ref 0.6–3.4)
MCH, POC: 28.9 pg (ref 27–31.2)
MCHC: 32.5 g/dL (ref 31.8–35.4)
MCV: 88.9 fL (ref 80–97)
MID (CBC): 0.6 (ref 0–0.9)
MPV: 6.6 fL (ref 0–99.8)
POC Granulocyte: 6.5 (ref 2–6.9)
POC LYMPH PERCENT: 24.9 %L (ref 10–50)
POC MID %: 6 % (ref 0–12)
Platelet Count, POC: 339 10*3/uL (ref 142–424)
RBC: 4.95 M/uL (ref 4.04–5.48)
RDW, POC: 12.3 %
WBC: 9.4 10*3/uL (ref 4.6–10.2)

## 2015-02-09 NOTE — Progress Notes (Signed)
Subjective:    Patient ID: Katelyn [name], female    DOB: 28-Feb-1941, 74 y.o.   MRN: [id number]  Chief Complaint  Patient presents with  . Nasal Congestion    x 2 weeks  . Cough  . Sweating a lot   Medications, allergies, past medical history, surgical history, family history, social history and problem list reviewed and updated.  HPI  74 yof with above complaints.   Sx started 2 wks ago with head/nasal congestion, rhinorrhea. Followed by mildly prod cough for week afterward. URI sx have resolved other than slight non prod cough persisting. Denies fevers. Denies sob. Denies cp.   For past 4-5 days has felt very diaphoretic with even light activity. Fatigued past few days despite not doing much. Denies abd pain, diarrhea, emesis.   Pt had cath for cp 6 months ago --> 30-40 distal lad, 30% diagonal, 20% proximal rca. Normal EF. Cmp, tsh normal at that time.   Review of Systems See HPI     Objective:   Physical Exam  Constitutional: She appears well-developed and well-nourished.  Non-toxic appearance. She does not have a sickly appearance. She does not appear ill. No distress.  BP 134/88 mmHg  Pulse 84  Temp(Src) 98.2 F (36.8 C) (Oral)  Resp 16  Ht 5\' 1"  (1.549 m)  Wt 136 lb (61.689 kg)  BMI 25.71 kg/m2  SpO2 98%   HENT:  Right Ear: Tympanic membrane normal.  Left Ear: Tympanic membrane normal.  Nose: Mucosal edema present. Right sinus exhibits no maxillary sinus tenderness and no frontal sinus tenderness. Left sinus exhibits no maxillary sinus tenderness and no frontal sinus tenderness.  Mouth/Throat: Uvula is midline, oropharynx is clear and moist and mucous membranes are normal.  Cardiovascular: Normal rate, regular rhythm and normal heart sounds.   Pulmonary/Chest: Effort normal and breath sounds normal. No tachypnea.  Abdominal: Soft. Normal appearance and bowel sounds are normal. There is no tenderness. There is no rigidity, no rebound, no guarding, no  tenderness at McBurney's point and negative Murphy's sign.  Lymphadenopathy:       Head (right side): No submental, no submandibular and no tonsillar adenopathy present.       Head (left side): No submental, no submandibular and no tonsillar adenopathy present.    She has no cervical adenopathy.   Results for orders placed or performed in visit on 02/09/15  POCT CBC  Result Value Ref Range   WBC 9.4 4.6 - 10.2 K/uL   Lymph, poc 2.3 0.6 - 3.4   POC LYMPH PERCENT 24.9 10 - 50 %L   MID (cbc) 0.6 0 - 0.9   POC MID % 6.0 0 - 12 %M   POC Granulocyte 6.5 2 - 6.9   Granulocyte percent 69.1 37 - 80 %G   RBC 4.95 4.04 - 5.48 M/uL   Hemoglobin 14.3 12.2 - 16.2 g/dL   HCT, POC 44.0 37.7 - 47.9 %   MCV 88.9 80 - 97 fL   MCH, POC 28.9 27 - 31.2 pg   MCHC 32.5 31.8 - 35.4 g/dL   RDW, POC 12.3 %   Platelet Count, POC 339 142 - 424 K/uL   MPV 6.6 0 - 99.8 fL   EKG read by Dr. Everlene Farrier.  Findings: Normal     Assessment & Plan:   Diaphoresis - Plan: POCT CBC, EKG 12-Lead  Other fatigue - Plan: EKG 12-Lead  Cough --fatigue and diaphoresis concerning for cardiac etiology in 74 yof --> relatively  normal cath 6 months ago, no cp, normal ekg today all reassuring --benign exam today, no leukocytosis, normal cmp/tsh 6 months ago --suspect sx from resolving recent uri -- rest, fluids, tylenol prn --rtc if worsening or if no improvement in 4-5 days  Julieta Gutting, PA-C Physician Assistant-Certified [facility]  02/09/2015 2:07 PM

## 2015-02-09 NOTE — Patient Instructions (Signed)
Your white blood cell count was normal today which is reassuring.  Your vital signs were all normal today which is reassuring.  Your ekg was normal today. The sweating and fatigue is likely from your upper respiratory sickness resolving and should continue to improve over the next few days.  Rest, fluids, and tylenol as needed for aches will help. Please come back to see [place] if you're not improved in 4-5 days.

## 2015-02-19 ENCOUNTER — Other Ambulatory Visit: Payer: Self-pay | Admitting: Internal Medicine

## 2015-03-26 ENCOUNTER — Ambulatory Visit (INDEPENDENT_AMBULATORY_CARE_PROVIDER_SITE_OTHER): Payer: PPO

## 2015-03-26 DIAGNOSIS — Z23 Encounter for immunization: Secondary | ICD-10-CM

## 2015-03-28 ENCOUNTER — Other Ambulatory Visit: Payer: Self-pay | Admitting: Internal Medicine

## 2015-07-03 ENCOUNTER — Other Ambulatory Visit: Payer: Self-pay

## 2015-07-03 DIAGNOSIS — Z1231 Encounter for screening mammogram for malignant neoplasm of breast: Secondary | ICD-10-CM

## 2015-07-10 ENCOUNTER — Other Ambulatory Visit (INDEPENDENT_AMBULATORY_CARE_PROVIDER_SITE_OTHER): Payer: PPO

## 2015-07-10 DIAGNOSIS — Z0000 Encounter for general adult medical examination without abnormal findings: Secondary | ICD-10-CM | POA: Diagnosis not present

## 2015-07-10 LAB — CBC WITH DIFFERENTIAL/PLATELET
BASOS ABS: 0 10*3/uL (ref 0.0–0.1)
Basophils Relative: 0.5 % (ref 0.0–3.0)
Eosinophils Absolute: 0.2 10*3/uL (ref 0.0–0.7)
Eosinophils Relative: 3.1 % (ref 0.0–5.0)
HCT: 43.5 % (ref 36.0–46.0)
Hemoglobin: 15 g/dL (ref 12.0–15.0)
LYMPHS ABS: 3 10*3/uL (ref 0.7–4.0)
Lymphocytes Relative: 39 % (ref 12.0–46.0)
MCHC: 34.5 g/dL (ref 30.0–36.0)
MCV: 90.2 fl (ref 78.0–100.0)
MONO ABS: 0.7 10*3/uL (ref 0.1–1.0)
Monocytes Relative: 8.9 % (ref 3.0–12.0)
NEUTROS ABS: 3.7 10*3/uL (ref 1.4–7.7)
NEUTROS PCT: 48.5 % (ref 43.0–77.0)
PLATELETS: 305 10*3/uL (ref 150.0–400.0)
RBC: 4.82 Mil/uL (ref 3.87–5.11)
RDW: 12.9 % (ref 11.5–15.5)
WBC: 7.7 10*3/uL (ref 4.0–10.5)

## 2015-07-10 LAB — BASIC METABOLIC PANEL
BUN: 18 mg/dL (ref 6–23)
CHLORIDE: 103 meq/L (ref 96–112)
CO2: 30 mEq/L (ref 19–32)
CREATININE: 0.83 mg/dL (ref 0.40–1.20)
Calcium: 10 mg/dL (ref 8.4–10.5)
GFR: 71.52 mL/min (ref >60.00)
Glucose, Bld: 103 mg/dL — ABNORMAL HIGH (ref 70–99)
Potassium: 5 mEq/L (ref 3.5–5.1)
Sodium: 140 mEq/L (ref 135–145)

## 2015-07-10 LAB — POC URINALSYSI DIPSTICK (AUTOMATED)
Bilirubin, UA: NEGATIVE
GLUCOSE UA: NEGATIVE
Ketones, UA: NEGATIVE
Leukocytes, UA: NEGATIVE
NITRITE UA: NEGATIVE
PH UA: 7
Protein, UA: NEGATIVE
Spec Grav, UA: 1.01
UROBILINOGEN UA: 0.2

## 2015-07-10 LAB — HEPATIC FUNCTION PANEL
ALBUMIN: 4.6 g/dL (ref 3.5–5.2)
ALK PHOS: 43 U/L (ref 39–117)
ALT: 23 U/L (ref 0–35)
AST: 22 U/L (ref 0–37)
Bilirubin, Direct: 0.1 mg/dL (ref 0.0–0.3)
Total Bilirubin: 0.8 mg/dL (ref 0.2–1.2)
Total Protein: 7.6 g/dL (ref 6.0–8.3)

## 2015-07-10 LAB — LIPID PANEL
CHOL/HDL RATIO: 3
Cholesterol: 156 mg/dL (ref 0–200)
HDL: 47 mg/dL (ref >39.00)
LDL Cholesterol: 92 mg/dL (ref 0–99)
NONHDL: 109.38
Triglycerides: 85 mg/dL (ref 0.0–149.0)
VLDL: 17 mg/dL (ref 0.0–40.0)

## 2015-07-10 LAB — TSH: TSH: 1.09 u[IU]/mL (ref 0.35–4.50)

## 2015-07-17 ENCOUNTER — Encounter: Payer: Self-pay | Admitting: Internal Medicine

## 2015-07-17 ENCOUNTER — Ambulatory Visit (INDEPENDENT_AMBULATORY_CARE_PROVIDER_SITE_OTHER): Payer: PPO | Admitting: Internal Medicine

## 2015-07-17 VITALS — BP 140/74 | HR 57 | Temp 98.5°F | Resp 18 | Ht 60.0 in | Wt 135.0 lb

## 2015-07-17 DIAGNOSIS — I10 Essential (primary) hypertension: Secondary | ICD-10-CM | POA: Diagnosis not present

## 2015-07-17 DIAGNOSIS — E785 Hyperlipidemia, unspecified: Secondary | ICD-10-CM | POA: Diagnosis not present

## 2015-07-17 DIAGNOSIS — I2510 Atherosclerotic heart disease of native coronary artery without angina pectoris: Secondary | ICD-10-CM

## 2015-07-17 DIAGNOSIS — Z0000 Encounter for general adult medical examination without abnormal findings: Secondary | ICD-10-CM | POA: Diagnosis not present

## 2015-07-17 NOTE — Progress Notes (Signed)
Pre visit review using our clinic review tool, if applicable. No additional management support is needed unless otherwise documented below in the visit note. 

## 2015-07-17 NOTE — Patient Instructions (Signed)
Limit your sodium (Salt) intake    It is important that you exercise regularly, at least 20 minutes 3 to 4 times per week.  If you develop chest pain or shortness of breath seek  medical attention.  Take a calcium supplement, plus [phone] units of vitamin D  Heart-Healthy Eating Plan Many factors influence your heart health, including eating and exercise habits. Heart (coronary) risk increases with abnormal blood fat (lipid) levels. Heart-healthy meal planning includes limiting unhealthy fats, increasing healthy fats, and making other small dietary changes. This includes maintaining a healthy body weight to help keep lipid levels within a normal range. WHAT IS MY PLAN?  Your health care provider recommends that you:  Get no more than _________% of the total calories in your daily diet from fat.  Limit your intake of saturated fat to less than _________% of your total calories each day.  Limit the amount of cholesterol in your diet to less than _________ mg per day. WHAT TYPES OF FAT SHOULD I CHOOSE?  Choose healthy fats more often. Choose monounsaturated and polyunsaturated fats, such as olive oil and canola oil, flaxseeds, walnuts, almonds, and seeds.  Eat more omega-3 fats. Good choices include salmon, mackerel, sardines, tuna, flaxseed oil, and ground flaxseeds. Aim to eat fish at least two times each week.  Limit saturated fats. Saturated fats are primarily found in animal products, such as meats, butter, and cream. Plant sources of saturated fats include palm oil, palm kernel oil, and coconut oil.  Avoid foods with partially hydrogenated oils in them. These contain trans fats. Examples of foods that contain trans fats are stick margarine, some tub margarines, cookies, crackers, and other baked goods. WHAT GENERAL GUIDELINES DO I NEED TO FOLLOW?  Check food labels carefully to identify foods with trans fats or high amounts of saturated fat.  Fill one half of your plate with  vegetables and green salads. Eat 4-5 servings of vegetables per day. A serving of vegetables equals 1 cup of raw leafy vegetables,  cup of raw or cooked cut-up vegetables, or  cup of vegetable juice.  Fill one fourth of your plate with whole grains. Look for the word "whole" as the first word in the ingredient list.  Fill one fourth of your plate with lean protein foods.  Eat 4-5 servings of fruit per day. A serving of fruit equals one medium whole fruit,  cup of dried fruit,  cup of fresh, frozen, or canned fruit, or  cup of 100% fruit juice.  Eat more foods that contain soluble fiber. Examples of foods that contain this type of fiber are apples, broccoli, carrots, beans, peas, and barley. Aim to get 20-30 g of fiber per day.  Eat more home-cooked food and less restaurant, buffet, and fast food.  Limit or avoid alcohol.  Limit foods that are high in starch and sugar.  Avoid fried foods.  Cook foods by using methods other than frying. Baking, boiling, grilling, and broiling are all great options. Other fat-reducing suggestions include:  Removing the skin from poultry.  Removing all visible fats from meats.  Skimming the fat off of stews, soups, and gravies before serving them.  Steaming vegetables in water or broth.  Lose weight if you are overweight. Losing just 5-10% of your initial body weight can help your overall health and prevent diseases such as diabetes and heart disease.  Increase your consumption of nuts, legumes, and seeds to 4-5 servings per week. One serving of dried beans or legumes  equals  cup after being cooked, one serving of nuts equals 1 ounces, and one serving of seeds equals  ounce or 1 tablespoon.  You may need to monitor your salt (sodium) intake, especially if you have high blood pressure. Talk with your health care provider or dietitian to get more information about reducing sodium. WHAT FOODS CAN I EAT? Grains Breads, including [place], white,  pita, wheat, raisin, rye, oatmeal, and [place]. Tortillas that are neither fried nor made with lard or trans fat. Low-fat rolls, including hotdog and hamburger buns and English muffins. Biscuits. Muffins. Waffles. Pancakes. Light popcorn. Whole-grain cereals. Flatbread. Melba toast. Pretzels. Breadsticks. Rusks. Low-fat snacks and crackers, including oyster, saltine, matzo, graham, animal, and rye. Rice and pasta, including brown rice and those that are made with whole wheat. Vegetables All vegetables. Fruits All fruits, but limit coconut. Meats and Other Protein Sources Lean, well-trimmed beef, veal, pork, and lamb. Chicken and [place] without skin. All fish and shellfish. Wild duck, rabbit, pheasant, and venison. Egg whites or low-cholesterol egg substitutes. Dried beans, peas, lentils, and tofu.Seeds and most nuts. Dairy Low-fat or nonfat cheeses, including ricotta, string, and mozzarella. Skim or 1% milk that is liquid, powdered, or evaporated. Buttermilk that is made with low-fat milk. Nonfat or low-fat yogurt. Beverages Mineral water. Diet carbonated beverages. Sweets and Desserts Sherbets and fruit ices. Honey, jam, marmalade, jelly, and syrups. Meringues and gelatins. Pure sugar candy, such as hard candy, jelly beans, gumdrops, mints, marshmallows, and small amounts of dark chocolate. [employer] Eat all sweets and desserts in moderation. Fats and Oils Nonhydrogenated (trans-free) margarines. Vegetable oils, including soybean, sesame, sunflower, olive, peanut, safflower, corn, canola, and cottonseed. Salad dressings or mayonnaise that are made with a vegetable oil. Limit added fats and oils that you use for cooking, baking, salads, and as spreads. Other Cocoa powder. Coffee and tea. All seasonings and condiments. The items listed above may not be a complete list of recommended foods or beverages. Contact your dietitian for more options. WHAT FOODS ARE NOT RECOMMENDED? Grains Breads  that are made with saturated or trans fats, oils, or whole milk. Croissants. Butter rolls. Cheese breads. Sweet rolls. Donuts. Buttered popcorn. Chow mein noodles. High-fat crackers, such as cheese or butter crackers. Meats and Other Protein Sources Fatty meats, such as hotdogs, short ribs, sausage, spareribs, bacon, ribeye roast or steak, and mutton. High-fat deli meats, such as salami and bologna. Caviar. Domestic duck and goose. Organ meats, such as kidney, liver, sweetbreads, brains, gizzard, chitterlings, and heart. Dairy Cream, sour cream, cream cheese, and creamed cottage cheese. Whole milk cheeses, including blue (bleu), Monterey Jack, [place], [place], American, [place], Swiss, [place], [place], and [place]. Whole or 2% milk that is liquid, evaporated, or condensed. Whole buttermilk. Cream sauce or high-fat cheese sauce. Yogurt that is made from whole milk. Beverages Regular sodas and drinks with added sugar. Sweets and Desserts Frosting. Pudding. Cookies. Cakes other than angel food cake. Candy that has milk chocolate or white chocolate, hydrogenated fat, butter, coconut, or unknown ingredients. Buttered syrups. Full-fat ice cream or ice cream drinks. Fats and Oils Gravy that has suet, meat fat, or shortening. Cocoa butter, hydrogenated oils, palm oil, coconut oil, palm kernel oil. These can often be found in baked products, candy, fried foods, nondairy creamers, and whipped toppings. Solid fats and shortenings, including bacon fat, salt pork, lard, and butter. Nondairy cream substitutes, such as coffee creamers and sour cream substitutes. Salad dressings that are made of unknown oils, cheese, or sour cream.  The items listed above may not be a complete list of foods and beverages to avoid. Contact your dietitian for more information.   This information is not intended to replace advice given to you by your health care provider. Make sure you discuss any questions you have with your health care  provider.   Document Released: 02/18/2008 Document Revised: 06/01/2014 Document Reviewed: 11/02/2013 Elsevier Interactive Patient Education [employer].

## 2015-07-17 NOTE — Progress Notes (Signed)
Subjective:    Patient ID: Katelyn [name], female    DOB: March 27, 1941, 75 y.o.   MRN: [id number]  HPI  80  white female, who is seen today for an annual health assessment She has a history of hypertension, dyslipidemia, and mild osteoarthritis.  She has osteopenia.  She is doing quite well without concerns or complaints She did have a final screening colonoscopy 2014 She is followed annually by gynecology and is scheduled for a follow-up mammogram next month  She was hospitalized about one year ago for ischemic chest pain and is status post heart catheterization.  She is doing quite well with regular exercise and has no ischemic symptoms.  Family history both parents died at approximately age 33.  Father had osteoporosis and died of metastatic melanoma.  Mother died in a nursing home.   Past Medical History  Diagnosis Date  . Shortness of breath   . Palpitations   . Unspecified essential hypertension   . Other and unspecified hyperlipidemia   . Anxiety state, unspecified   . Osteoarthrosis, unspecified whether generalized or localized, unspecified site   . Disorder of bone and cartilage, unspecified     Past Surgical History  Procedure Laterality Date  . Total abdominal hysterectomy  2001  . Bilateral salpingoophorectomy  2001  . Tonsillectomy  1957  . Colonoscopy  [id number]  . Left heart catheterization with coronary angiogram N/A 06/26/2014    Procedure: LEFT HEART CATHETERIZATION WITH CORONARY ANGIOGRAM; Surgeon: Leonie Man, MD; Location: [facility] CATH LAB; Service: Cardiovascular; Laterality: N/A;           Past Medical History  Diagnosis Date  . Shortness of breath   . Palpitations   . Unspecified essential hypertension   . Other and unspecified hyperlipidemia   . Anxiety state, unspecified   . Osteoarthrosis, unspecified whether generalized or localized, unspecified site   . Disorder of bone and  cartilage, unspecified     Social History   Social History  . Marital Status: Married    Spouse Name: N/A  . Number of Children: N/A  . Years of Education: N/A   Occupational History  . Not on file.   Social History Main Topics  . Smoking status: Never Smoker   . Smokeless tobacco: Never Used  . Alcohol Use: No  . Drug Use: No  . Sexual Activity: Not on file   Other Topics Concern  . Not on file   Social History Narrative    Past Surgical History  Procedure Laterality Date  . Total abdominal hysterectomy  2001  . Bilateral salpingoophorectomy  2001  . Tonsillectomy  1957  . Colonoscopy  [id number]  . Left heart catheterization with coronary angiogram N/A 06/26/2014    Procedure: LEFT HEART CATHETERIZATION WITH CORONARY ANGIOGRAM;  Surgeon: Leonie Man, MD;  Location: [facility] CATH LAB;  Service: Cardiovascular;  Laterality: N/A;    Family History  Problem Relation Age of Onset  . Pneumonia Mother   . Asthma    . Allergies    . Colon cancer Neg Hx   . Stomach cancer Neg Hx     Allergies  Allergen Reactions  . Penicillins     REACTION: rash  . Tetracycline     REACTION: rash    Current Outpatient Prescriptions on File Prior to Visit  Medication Sig Dispense Refill  . aspirin 81 MG EC tablet Take 81 mg by mouth daily.      Marland Kitchen atenolol (TENORMIN) 50 MG tablet TAKE  ONE TABLET BY MOUTH TWICE DAILY 180 tablet 1  . Calcium Citrate-Vitamin D (CITRACAL PETITES/VITAMIN D PO) Take 1 tablet by mouth daily.      . Cholecalciferol (VITAMIN D3) 2000 UNITS TABS Take 1 tablet by mouth daily.      . Cyanocobalamin (VITAMIN B-12 PO) Take 1 tablet by mouth daily.    Marland Kitchen estradiol (VIVELLE-DOT) 0.075 MG/24HR Place 1 patch onto the skin 2 (two) times a week. Sunday and Wednesday    . hydrochlorothiazide (HYDRODIURIL) 12.5 MG tablet TAKE ONE TABLET BY MOUTH ONCE DAILY 90 tablet 1  . KLOR-CON M10 10 MEQ tablet TAKE TWO TABLETS BY MOUTH ONCE DAILY 180 tablet 1  . meloxicam (MOBIC) 15 MG  tablet Take 1 tablet (15 mg total) by mouth daily. 60 tablet 2  . pravastatin (PRAVACHOL) 40 MG tablet TAKE ONE TABLET BY MOUTH AT BEDTIME 90 tablet 2  . [DISCONTINUED] potassium chloride (K-DUR) 10 MEQ tablet Take 2 capsules daily 180 tablet 3   No current facility-administered medications on file prior to visit.    BP 140/74 mmHg  Pulse 57  Temp(Src) 98.5 F (36.9 C) (Oral)  Resp 18  Ht 5' (1.524 m)  Wt 135 lb (61.236 kg)  BMI 26.37 kg/m2  SpO2 98%  1. Risk factors, based on past  M,S,F history.  Cardiac risk factors include dyslipidemia and hypertension.  Patient has known coronary artery disease  2.  Physical activities: Remains quite active.  Does go to her health club 2-3 times per week and walks frequently  3.  Depression/mood: No history depression or mood disorder  4.  Hearing: No deficits  5.  ADL's: Independent  6.  Fall risk: Low  7.  Home safety: No problems identified  8.  Height weight, and visual acuity; height and weight stable no change in visual acuity does have an annual eye examination  9.  Counseling: Continue heart healthy diet  10. Lab orders based on risk factors: Laboratory profile including lipid panel reviewed  11. Referral : Follow-up OB/GYN  12. Care plan: Follow-up OB/GYN.  Patient is scheduled for eye examination later this year as well as a mammogram next month.  We'll continue efforts at aggressive risk factor modification.  Heart healthy diet.  Discussed and encouraged  13. Cognitive assessment: Alert and oriented with normal affect no cognitive dysfunction  14. Screening: Patient provided with a written and personalized 5-10 year screening schedule in the AVS.  .  We'll continue no clinical examinations with screening lab and periodic mammograms.  Final colonoscopy 2014  15. Provider List Update: Primary care medicine OB/GYN radiology      Review of Systems  Constitutional: Negative for fever, appetite change, fatigue and  unexpected weight change.  HENT: Negative for congestion, dental problem, ear pain, hearing loss, mouth sores, nosebleeds, sinus pressure, sore throat, tinnitus, trouble swallowing and voice change.   Eyes: Negative for photophobia, pain, redness and visual disturbance.  Respiratory: Negative for cough, chest tightness and shortness of breath.   Cardiovascular: Negative for chest pain, palpitations and leg swelling.  Gastrointestinal: Negative for nausea, vomiting, abdominal pain, diarrhea, constipation, blood in stool, abdominal distention and rectal pain.  Genitourinary: Negative for dysuria, urgency, frequency, hematuria, flank pain, vaginal bleeding, vaginal discharge, difficulty urinating, genital sores, vaginal pain, menstrual problem and pelvic pain.  Musculoskeletal: Negative for back pain, arthralgias and neck stiffness.  Skin: Negative for rash.  Neurological: Negative for dizziness, syncope, speech difficulty, weakness, light-headedness, numbness and headaches.  Hematological: Negative  for adenopathy. Does not bruise/bleed easily.  Psychiatric/Behavioral: Negative for suicidal ideas, behavioral problems, self-injury, dysphoric mood and agitation. The patient is not nervous/anxious.        Objective:   Physical Exam  Constitutional: She is oriented to person, place, and time. She appears well-developed and well-nourished.  HENT:  Head: Normocephalic and atraumatic.  Right Ear: External ear normal.  Left Ear: External ear normal.  Mouth/Throat: Oropharynx is clear and moist.  Eyes: Conjunctivae and EOM are normal.  Neck: Normal range of motion. Neck supple. No JVD present. No thyromegaly present.  Cardiovascular: Normal rate, regular rhythm, normal heart sounds and intact distal pulses.   No murmur heard. Posterior tibial pulses full.  Dorsalis pedis pulses faint  Pulmonary/Chest: Effort normal and breath sounds normal. She has no wheezes. She has no rales.  Abdominal: Soft.  Bowel sounds are normal. She exhibits no distension and no mass. There is no tenderness. There is no rebound and no guarding.  Musculoskeletal: Normal range of motion. She exhibits no edema or tenderness.  Neurological: She is alert and oriented to person, place, and time. She has normal reflexes. No cranial nerve deficit. She exhibits normal muscle tone. Coordination normal.  Skin: Skin is warm and dry. No rash noted.  Psychiatric: She has a normal mood and affect. Her behavior is normal.          Assessment & Plan:   Preventive health examination Hypertension well controlled, osteopenia.  Continue calcium and vitamin D supplementation Dyslipidemia.  Continue statin therapy Coronary artery disease.  Will continue aggressive risk factor modification  Follow-up GYN Recheck here one year or as needed

## 2015-07-23 ENCOUNTER — Other Ambulatory Visit: Payer: Self-pay | Admitting: *Deleted

## 2015-07-23 MED ORDER — POTASSIUM CHLORIDE CRYS ER 10 MEQ PO TBCR: 20.0000 meq | EXTENDED_RELEASE_TABLET | Freq: Every day | ORAL | Status: DC

## 2015-07-25 ENCOUNTER — Ambulatory Visit
Admission: RE | Admit: 2015-07-25 | Discharge: 2015-07-25 | Disposition: A | Discharge status: Home / Self Care | Payer: PPO | Source: Ambulatory Visit

## 2015-07-25 DIAGNOSIS — Z1231 Encounter for screening mammogram for malignant neoplasm of breast: Secondary | ICD-10-CM

## 2015-07-29 ENCOUNTER — Other Ambulatory Visit: Payer: Self-pay | Admitting: Gynecology

## 2015-07-29 DIAGNOSIS — R928 Other abnormal and inconclusive findings on diagnostic imaging of breast: Secondary | ICD-10-CM

## 2015-08-05 ENCOUNTER — Ambulatory Visit
Admission: RE | Admit: 2015-08-05 | Discharge: 2015-08-05 | Disposition: A | Discharge status: Home / Self Care | Payer: PPO | Source: Ambulatory Visit | Attending: Gynecology | Admitting: Gynecology

## 2015-08-05 ENCOUNTER — Other Ambulatory Visit: Payer: Self-pay | Admitting: Gynecology

## 2015-08-05 DIAGNOSIS — N6489 Other specified disorders of breast: Secondary | ICD-10-CM | POA: Diagnosis not present

## 2015-08-05 DIAGNOSIS — N63 Unspecified lump in breast: Secondary | ICD-10-CM | POA: Diagnosis not present

## 2015-08-05 DIAGNOSIS — R928 Other abnormal and inconclusive findings on diagnostic imaging of breast: Secondary | ICD-10-CM

## 2015-08-06 ENCOUNTER — Ambulatory Visit
Admission: RE | Admit: 2015-08-06 | Discharge: 2015-08-06 | Disposition: A | Discharge status: Home / Self Care | Payer: PPO | Source: Ambulatory Visit | Attending: Gynecology | Admitting: Gynecology

## 2015-08-06 ENCOUNTER — Other Ambulatory Visit: Payer: Self-pay | Admitting: Gynecology

## 2015-08-06 DIAGNOSIS — N63 Unspecified lump in breast: Secondary | ICD-10-CM | POA: Diagnosis not present

## 2015-08-06 DIAGNOSIS — R928 Other abnormal and inconclusive findings on diagnostic imaging of breast: Secondary | ICD-10-CM

## 2015-08-06 DIAGNOSIS — N6011 Diffuse cystic mastopathy of right breast: Secondary | ICD-10-CM | POA: Diagnosis not present

## 2015-08-10 ENCOUNTER — Ambulatory Visit (INDEPENDENT_AMBULATORY_CARE_PROVIDER_SITE_OTHER): Payer: PPO | Admitting: Family Medicine

## 2015-08-10 ENCOUNTER — Encounter: Payer: Self-pay | Admitting: Family Medicine

## 2015-08-10 VITALS — BP 122/82 | HR 60 | Temp 98.2°F | Resp 16 | Wt 134.0 lb

## 2015-08-10 DIAGNOSIS — R300 Dysuria: Secondary | ICD-10-CM | POA: Diagnosis not present

## 2015-08-10 DIAGNOSIS — R319 Hematuria, unspecified: Secondary | ICD-10-CM

## 2015-08-10 LAB — POCT URINALYSIS DIPSTICK
Bilirubin, UA: NEGATIVE
Glucose, UA: NEGATIVE
KETONES UA: NEGATIVE
Nitrite, UA: NEGATIVE
PH UA: 7
PROTEIN UA: NEGATIVE
Spec Grav, UA: 1.02
UROBILINOGEN UA: 0.2

## 2015-08-10 MED ORDER — CIPROFLOXACIN HCL 500 MG PO TABS: 500.0000 mg | ORAL_TABLET | Freq: Two times a day (BID) | ORAL | Status: DC

## 2015-08-10 NOTE — Patient Instructions (Signed)

## 2015-08-10 NOTE — Progress Notes (Signed)
SUBJECTIVE: Katelyn [name] is a 75 y.o. female pt of Dr. Raliegh Ip, new to me, who presents to weekend clinic with complaints of urinary frequency, urgency and dysuria x 2 days, without flank pain, fever, chills, or abnormal vaginal discharge or bleeding.   She has been taking AZO with some relief of symptoms.  PMH significant for PCN and Tetracycline allergies.  S/p breast biopsy earlier this week. Current Outpatient Prescriptions on File Prior to Visit  Medication Sig Dispense Refill  . aspirin 81 MG EC tablet Take 81 mg by mouth daily.      Marland Kitchen atenolol (TENORMIN) 50 MG tablet TAKE ONE TABLET BY MOUTH TWICE DAILY 180 tablet 1  . Calcium Citrate-Vitamin D (CITRACAL PETITES/VITAMIN D PO) Take 1 tablet by mouth daily.      . Cholecalciferol (VITAMIN D3) 2000 UNITS TABS Take 1 tablet by mouth daily.      . Cyanocobalamin (VITAMIN B-12 PO) Take 1 tablet by mouth daily.    Marland Kitchen estradiol (VIVELLE-DOT) 0.075 MG/24HR Place 1 patch onto the skin 2 (two) times a week. Sunday and Wednesday    . hydrochlorothiazide (HYDRODIURIL) 12.5 MG tablet TAKE ONE TABLET BY MOUTH ONCE DAILY 90 tablet 1  . meloxicam (MOBIC) 15 MG tablet Take 1 tablet (15 mg total) by mouth daily. 60 tablet 2  . potassium chloride (KLOR-CON M10) 10 MEQ tablet Take 2 tablets (20 mEq total) by mouth daily. 180 tablet 3  . pravastatin (PRAVACHOL) 40 MG tablet TAKE ONE TABLET BY MOUTH AT BEDTIME 90 tablet 2  . [DISCONTINUED] potassium chloride (K-DUR) 10 MEQ tablet Take 2 capsules daily 180 tablet 3   No current facility-administered medications on file prior to visit.    Allergies  Allergen Reactions  . Penicillins     REACTION: rash  . Tetracycline     REACTION: rash    Past Medical History  Diagnosis Date  . Shortness of breath   . Palpitations   . Unspecified essential hypertension   . Other and unspecified hyperlipidemia   . Anxiety state, unspecified   . Osteoarthrosis, unspecified whether generalized or localized,  unspecified site   . Disorder of bone and cartilage, unspecified     Past Surgical History  Procedure Laterality Date  . Total abdominal hysterectomy  2001  . Bilateral salpingoophorectomy  2001  . Tonsillectomy  1957  . Colonoscopy  [id number]  . Left heart catheterization with coronary angiogram N/A 06/26/2014    Procedure: LEFT HEART CATHETERIZATION WITH CORONARY ANGIOGRAM;  Surgeon: Leonie Man, MD;  Location: [facility] CATH LAB;  Service: Cardiovascular;  Laterality: N/A;    Family History  Problem Relation Age of Onset  . Pneumonia Mother   . Asthma    . Allergies    . Colon cancer Neg Hx   . Stomach cancer Neg Hx     Social History   Social History  . Marital Status: Married    Spouse Name: N/A  . Number of Children: N/A  . Years of Education: N/A   Occupational History  . Not on file.   Social History Main Topics  . Smoking status: Never Smoker   . Smokeless tobacco: Never Used  . Alcohol Use: No  . Drug Use: No  . Sexual Activity: Not on file   Other Topics Concern  . Not on file   Social History Narrative   The PMH, PSH, Social History, Family History, Medications, and allergies have been reviewed in [facility], and have been updated if relevant.  OBJECTIVE:  BP 122/82 mmHg  Pulse 60  Temp(Src) 98.2 F (36.8 C) (Oral)  Resp 16  Wt 134 lb (60.782 kg)  SpO2 97%  Appears well, in no apparent distress.  Vital signs are normal. The abdomen is soft without tenderness, guarding, mass, rebound or organomegaly. No CVA tenderness or inguinal adenopathy noted. Urine dipstick shows positive for RBC's and positive for leukocytes.    ASSESSMENT: UTI uncomplicated without evidence of pyelonephritis  PLAN: Treatment per orders - cipro 500 mg twice daily x 3 days, also push fluids, may use Pyridium OTC prn. Call or return to clinic prn if these symptoms worsen or fail to improve as anticipated.

## 2015-08-10 NOTE — Progress Notes (Signed)
Pre visit review using our clinic review tool, if applicable. No additional management support is needed unless otherwise documented below in the visit note. 

## 2015-08-10 NOTE — Addendum Note (Signed)
Addended by: Davis Gourd on: 08/10/2015 11:38 AM   Modules accepted: Orders

## 2015-08-12 LAB — URINE CULTURE: Culture: 6000

## 2015-08-19 ENCOUNTER — Ambulatory Visit: Payer: Self-pay | Admitting: Surgery

## 2015-08-19 DIAGNOSIS — N6091 Unspecified benign mammary dysplasia of right breast: Secondary | ICD-10-CM | POA: Diagnosis not present

## 2015-08-19 NOTE — H&P (Signed)
Katelyn [name] 08/19/2015 11:26 AM Location: [facility] Patient #: [name] DOB: 1941-03-24 Married / Language: English / Race: White Female   History of Present Illness Katelyn Moores A. Gabrielle Mester MD; 08/19/2015 11:50 AM) Patient words: breast f/u   Patient sent at the request of Dr. Dorise Bullion for a mammographic abnormality right breast detected on screening mammogram. Patient is a 6 mm lesion in the right inferior medial breast core biopsy proven to be atypical ductal hyperplasia. Patient denies history of breast pain, breast mass or any other breast issue.          CLINICAL DATA: Patient was called back from screening mammogram for possible mass in the right breast. EXAM: DIGITAL DIAGNOSTIC RIGHT MAMMOGRAM WITH 3D TOMOSYNTHESIS ULTRASOUND RIGHT BREAST COMPARISON: Previous exam(s). ACR Breast Density Category b: There are scattered areas of fibroglandular density. FINDINGS: Additional imaging of the right breast was obtained. There is persistence of a developing 4 mm mass compared to the prior exams. It is associated with 2 punctate calcifications. On physical exam, I do not palpate a mass in the inferior aspect of the right breast. Targeted ultrasound is performed, showing there is a mildly dilated duct in the 6 o'clock subareolar region of the right breast. It has several punctate echogenic foci consistent with calcifications. The area measures 5 x 1 x 5 mm. It is thought to correspond with the mammographic abnormality. IMPRESSION: Indeterminate mass in the 6 o'clock region of the right breast. The lesion is thought to be seen better mammographically therefore stereotactic biopsy recommended. RECOMMENDATION: Stereotactic biopsy of the right breast mass is recommended. This will be scheduled at the patient's convenience. I have discussed the findings and recommendations with the patient. Results were also provided in writing at the conclusion of the visit.  If applicable, a reminder letter will be sent to the patient regarding the next appointment. BI-RADS CATEGORY 4: Suspicious. Electronically Signed By: Lillia Mountain M.D. On: 08/05/2015 10:55    Diagnosis Breast, right, needle core biopsy, lower inner - ATYPICAL DUCTAL HYPERPLASIA WITH CALCIFICATIONS. - FIBROCYSTIC CHANGES. - PSEUDOANGIOMATOUS STROMAL HYPERPLASIA ([place]). - SEE COMMENT. Microscopic Comm             CLINICAL DATA: Evaluate marker placement EXAM: DIAGNOSTIC RIGHT MAMMOGRAM POST STEREOTACTIC BIOPSY COMPARISON: Previous exam(s). FINDINGS: Mammographic images were obtained following stereotactic guided biopsy of a right breast mass with calcifications. The X shaped marker is located 16 mm superior to the biopsied mass and calcifications. IMPRESSION: The biopsy marker is located 16 mm superior to the biopsied mass and calcifications. Final Assessment: Post Procedure Mammograms for Marker Placement .  The patient is a 75 year old female.   Other Problems [name], CMA; 08/19/2015 11:26 AM) High blood pressure Hypercholesterolemia Lump In Breast Melanoma Oophorectomy  Past Surgical History [name], [place]; 08/19/2015 11:26 AM) Breast Biopsy Right. Hysterectomy (not due to cancer) - Complete Oral Surgery Tonsillectomy  Diagnostic Studies History [name], CMA; 08/19/2015 11:26 AM) Colonoscopy 1-5 years ago Mammogram within last year Pap Smear 1-5 years ago  Allergies [name], CMA; 08/19/2015 11:28 AM) PenicillAMINE *ASSORTED CLASSES* Tetracycline *CHEMICALS*  Medication History ([name], CMA; 08/19/2015 11:28 AM) Aspirin (81MG  Tablet DR, Oral) Active. Citracal + D (250-200MG -UNIT Tablet, Oral) Active. Vitamin D3 (2000UNIT Capsule, Oral) Active. Vitamin B12 (100MCG Tablet, Oral) Active. Estradiol (0.075MG /24HR Patch TW, Transdermal) Active. Tenormin (50MG  Tablet, Oral) Active. HydroCHLOROthiazide  (12.5MG  Tablet, Oral) Active. Mobic (15MG  Tablet, Oral) Active. Potassium Chloride ER (10MEQ Tablet ER, Oral) Active. Pravastatin Sodium (40MG   Tablet, Oral) Active. Medications Reconciled  Social History [name], CMA; 08/19/2015 11:26 AM) Caffeine use Carbonated beverages, Coffee, Tea. No alcohol use No drug use Tobacco use Never smoker.  Family History [name], [place]; 08/19/2015 11:26 AM) Arthritis Brother, Mother, Sister. Cerebrovascular Accident Mother. Hypertension Brother, Mother. Melanoma Father. Thyroid problems Mother.  Pregnancy / Birth History [name], [place]; 08/19/2015 11:26 AM) Age at menarche 76 years. Age of menopause 34-55 Gravida 3 Maternal age 55-25 Para 2    Review of Systems ([name]; 08/19/2015 11:27 AM) General Not Present- Appetite Loss, Chills, Fatigue, Fever, Night Sweats, Weight Gain and Weight Loss. Skin Not Present- Change in Wart/Mole, Dryness, Hives, Jaundice, New Lesions, Non-Healing Wounds, Rash and Ulcer. HEENT Present- Wears glasses/contact lenses. Not Present- Earache, Hearing Loss, Hoarseness, Nose Bleed, Oral Ulcers, Ringing in the Ears, Seasonal Allergies, Sinus Pain, Sore Throat, Visual Disturbances and Yellow Eyes. Respiratory Not Present- Bloody sputum, Chronic Cough, Difficulty Breathing, Snoring and Wheezing. Breast Present- Breast Mass. Not Present- Breast Pain, Nipple Discharge and Skin Changes. Cardiovascular Present- Palpitations. Not Present- Chest Pain, Difficulty Breathing Lying Down, Leg Cramps, Rapid Heart Rate, Shortness of Breath and Swelling of Extremities. Gastrointestinal Not Present- Abdominal Pain, Bloating, Bloody Stool, Change in Bowel Habits, Chronic diarrhea, Constipation, Difficulty Swallowing, Excessive gas, Gets full quickly at meals, Hemorrhoids, Indigestion, Nausea, Rectal Pain and Vomiting. Female Genitourinary Not Present- Frequency, Nocturia, Painful Urination, Pelvic Pain and  Urgency. Musculoskeletal Not Present- Back Pain, Joint Pain, Joint Stiffness, Muscle Pain, Muscle Weakness and Swelling of Extremities. Neurological Not Present- Decreased Memory, Fainting, Headaches, Numbness, Seizures, Tingling, Tremor, Trouble walking and Weakness. Psychiatric Not Present- Anxiety, Bipolar, Change in Sleep Pattern, Depression, Fearful and Frequent crying. Endocrine Present- Hot flashes. Not Present- Cold Intolerance, Excessive Hunger, Hair Changes, Heat Intolerance and New Diabetes. Hematology Not Present- Easy Bruising, Excessive bleeding, Gland problems, HIV and Persistent Infections.  Vitals ([name] CMA; 08/19/2015 11:27 AM) 08/19/2015 11:27 AM Weight: 130 lb Height: 60in Body Surface Area: 1.55 m Body Mass Index: 25.39 kg/m  Temp.: 35F(Temporal)  Pulse: 75 (Regular)  BP: 128/76 (Sitting, Left Arm, Standard)       Physical Exam (Marajade Lei A. Braya Habermehl MD; 08/19/2015 11:51 AM) General Mental Status-Alert. General Appearance-Consistent with stated age. Hydration-Well hydrated. Voice-Normal.  Head and Neck Head-normocephalic, atraumatic with no lesions or palpable masses. Trachea-midline. Thyroid Gland Characteristics - normal size and consistency.  Breast Breast - Left-Symmetric, Non Tender, No Biopsy scars, no Dimpling, No Inflammation, No Lumpectomy scars, No Mastectomy scars, No Peau d' Orange. Breast - Right-Symmetric, Non Tender, No Biopsy scars, no Dimpling, No Inflammation, No Lumpectomy scars, No Mastectomy scars, No Peau d' Orange. Breast Lump-No Palpable Breast Mass.  Musculoskeletal Normal Exam - Left-Upper Extremity Strength Normal and Lower Extremity Strength Normal. Normal Exam - Right-Upper Extremity Strength Normal and Lower Extremity Strength Normal.  Lymphatic Head & Neck  General Head & Neck Lymphatics: Bilateral - Description - Normal. Axillary  General Axillary Region: Bilateral - Description -  Normal. Tenderness - Non Tender.    Assessment & Plan (Dontea Corlew A. Besan Ketchem MD; 08/19/2015 11:56 AM) ATYPICAL DUCTAL HYPERPLASIA OF RIGHT BREAST (N60.91) Story: Risk of lumpectomy include bleeding, infection, seroma, more surgery, use of seed/wire, wound care, cosmetic deformity and the need for other treatments, death , blood clots, death. Pt agrees to proceed. Current Plans Pt Education - [facility] Breast Biopsy HCI: discussed with patient and provided information. The anatomy and the physiology was discussed. The pathophysiology and natural history of the disease was  discussed. Options were discussed and recommendations were made. Technique, risks, benefits, & alternatives were discussed. Risks such as stroke, heart attack, bleeding, indection, death, and other risks discussed. Questions answered. The patient agrees to proceed. You are being scheduled for surgery - Our schedulers will call you.  You should hear from our office's scheduling department within 5 working days about the location, date, and time of surgery. We try to make accommodations for patient's preferences in scheduling surgery, but sometimes the OR schedule or the surgeon's schedule prevents [place] from making those accommodations.  If you have not heard from our office [phone]) in 5 working days, call the office and ask for your surgeon's nurse.  If you have other questions about your diagnosis, plan, or surgery, call the office and ask for your surgeon's nurse.    Signed by Turner Daniels, MD (08/19/2015 11:57 AM

## 2015-08-27 ENCOUNTER — Other Ambulatory Visit: Payer: Self-pay | Admitting: Internal Medicine

## 2015-08-27 MED ORDER — PRAVASTATIN SODIUM 40 MG PO TABS: 40.0000 mg | ORAL_TABLET | Freq: Every day | ORAL | Status: DC

## 2015-08-29 DIAGNOSIS — N951 Menopausal and female climacteric states: Secondary | ICD-10-CM | POA: Diagnosis not present

## 2015-08-29 DIAGNOSIS — R300 Dysuria: Secondary | ICD-10-CM | POA: Diagnosis not present

## 2015-08-29 DIAGNOSIS — N6089 Other benign mammary dysplasias of unspecified breast: Secondary | ICD-10-CM | POA: Diagnosis not present

## 2015-08-29 DIAGNOSIS — Z01419 Encounter for gynecological examination (general) (routine) without abnormal findings: Secondary | ICD-10-CM | POA: Diagnosis not present

## 2015-09-02 ENCOUNTER — Other Ambulatory Visit: Payer: Self-pay | Admitting: Surgery

## 2015-09-02 DIAGNOSIS — N6091 Unspecified benign mammary dysplasia of right breast: Secondary | ICD-10-CM

## 2015-09-03 ENCOUNTER — Encounter (HOSPITAL_BASED_OUTPATIENT_CLINIC_OR_DEPARTMENT_OTHER): Payer: Self-pay | Admitting: *Deleted

## 2015-09-03 NOTE — Progress Notes (Signed)
Pt coming in Thursday for BMET. Bring all medications.

## 2015-09-04 DIAGNOSIS — R3129 Other microscopic hematuria: Secondary | ICD-10-CM | POA: Diagnosis not present

## 2015-09-05 ENCOUNTER — Encounter (HOSPITAL_BASED_OUTPATIENT_CLINIC_OR_DEPARTMENT_OTHER)
Admission: RE | Admit: 2015-09-05 | Discharge: 2015-09-05 | Disposition: A | Discharge status: Home / Self Care | Payer: PPO | Source: Ambulatory Visit | Attending: Surgery | Admitting: Surgery

## 2015-09-05 DIAGNOSIS — Z01812 Encounter for preprocedural laboratory examination: Secondary | ICD-10-CM | POA: Diagnosis not present

## 2015-09-05 DIAGNOSIS — N63 Unspecified lump in breast: Secondary | ICD-10-CM | POA: Insufficient documentation

## 2015-09-05 LAB — BASIC METABOLIC PANEL
ANION GAP: 9 (ref 5–15)
BUN: 14 mg/dL (ref 6–20)
CALCIUM: 9.5 mg/dL (ref 8.9–10.3)
CO2: 28 mmol/L (ref 22–32)
CREATININE: 0.71 mg/dL (ref 0.44–1.00)
Chloride: 105 mmol/L (ref 101–111)
Glucose, Bld: 102 mg/dL — ABNORMAL HIGH (ref 65–99)
Potassium: 4.4 mmol/L (ref 3.5–5.1)
SODIUM: 142 mmol/L (ref 135–145)

## 2015-09-10 ENCOUNTER — Ambulatory Visit
Admission: RE | Admit: 2015-09-10 | Discharge: 2015-09-10 | Disposition: A | Discharge status: Home / Self Care | Payer: PPO | Source: Ambulatory Visit | Attending: Surgery | Admitting: Surgery

## 2015-09-10 DIAGNOSIS — N6081 Other benign mammary dysplasias of right breast: Secondary | ICD-10-CM | POA: Diagnosis not present

## 2015-09-10 DIAGNOSIS — N6091 Unspecified benign mammary dysplasia of right breast: Secondary | ICD-10-CM

## 2015-09-12 ENCOUNTER — Ambulatory Visit
Admission: RE | Admit: 2015-09-12 | Discharge: 2015-09-12 | Disposition: A | Discharge status: Home / Self Care | Payer: PPO | Source: Ambulatory Visit | Attending: Surgery | Admitting: Surgery

## 2015-09-12 ENCOUNTER — Encounter (HOSPITAL_BASED_OUTPATIENT_CLINIC_OR_DEPARTMENT_OTHER): Payer: Self-pay | Admitting: Anesthesiology

## 2015-09-12 ENCOUNTER — Ambulatory Visit (HOSPITAL_BASED_OUTPATIENT_CLINIC_OR_DEPARTMENT_OTHER)
Admission: RE | Admit: 2015-09-12 | Discharge: 2015-09-12 | Disposition: A | Discharge status: Home / Self Care | Payer: PPO | Source: Ambulatory Visit | Attending: Surgery | Admitting: Surgery

## 2015-09-12 ENCOUNTER — Ambulatory Visit (HOSPITAL_BASED_OUTPATIENT_CLINIC_OR_DEPARTMENT_OTHER): Payer: PPO | Admitting: Anesthesiology

## 2015-09-12 ENCOUNTER — Encounter (HOSPITAL_BASED_OUTPATIENT_CLINIC_OR_DEPARTMENT_OTHER)
Admission: RE | Disposition: A | Discharge status: Home / Self Care | Payer: Self-pay | Source: Ambulatory Visit | Attending: Surgery

## 2015-09-12 DIAGNOSIS — Z881 Allergy status to other antibiotic agents status: Secondary | ICD-10-CM | POA: Diagnosis not present

## 2015-09-12 DIAGNOSIS — E7800 Pure hypercholesterolemia, unspecified: Secondary | ICD-10-CM | POA: Insufficient documentation

## 2015-09-12 DIAGNOSIS — N6081 Other benign mammary dysplasias of right breast: Secondary | ICD-10-CM | POA: Diagnosis not present

## 2015-09-12 DIAGNOSIS — Z7982 Long term (current) use of aspirin: Secondary | ICD-10-CM | POA: Insufficient documentation

## 2015-09-12 DIAGNOSIS — Z90710 Acquired absence of both cervix and uterus: Secondary | ICD-10-CM | POA: Diagnosis not present

## 2015-09-12 DIAGNOSIS — Z79899 Other long term (current) drug therapy: Secondary | ICD-10-CM | POA: Diagnosis not present

## 2015-09-12 DIAGNOSIS — I10 Essential (primary) hypertension: Secondary | ICD-10-CM | POA: Diagnosis not present

## 2015-09-12 DIAGNOSIS — I2510 Atherosclerotic heart disease of native coronary artery without angina pectoris: Secondary | ICD-10-CM | POA: Diagnosis not present

## 2015-09-12 DIAGNOSIS — Z85820 Personal history of malignant melanoma of skin: Secondary | ICD-10-CM | POA: Diagnosis not present

## 2015-09-12 DIAGNOSIS — Z880 Allergy status to penicillin: Secondary | ICD-10-CM | POA: Diagnosis not present

## 2015-09-12 DIAGNOSIS — N6091 Unspecified benign mammary dysplasia of right breast: Secondary | ICD-10-CM

## 2015-09-12 DIAGNOSIS — N6011 Diffuse cystic mastopathy of right breast: Secondary | ICD-10-CM | POA: Insufficient documentation

## 2015-09-12 HISTORY — PX: BREAST EXCISIONAL BIOPSY: SUR124

## 2015-09-12 HISTORY — PX: BREAST LUMPECTOMY WITH RADIOACTIVE SEED LOCALIZATION: SHX6424

## 2015-09-12 SURGERY — BREAST LUMPECTOMY WITH RADIOACTIVE SEED LOCALIZATION
Anesthesia: General | Site: Breast | Laterality: Right

## 2015-09-12 MED ORDER — LACTATED RINGERS IV SOLN
INTRAVENOUS | Status: DC
  Administered 2015-09-12: 12:00:00 via INTRAVENOUS

## 2015-09-12 MED ORDER — METOCLOPRAMIDE HCL 5 MG/ML IJ SOLN: 10.0000 mg | Freq: Once | INTRAMUSCULAR | Status: DC | PRN

## 2015-09-12 MED ORDER — PROPOFOL 10 MG/ML IV BOLUS
INTRAVENOUS | Status: AC
  Filled 2015-09-12: qty 20

## 2015-09-12 MED ORDER — SCOPOLAMINE 1 MG/3DAYS TD PT72: 1.0000 | MEDICATED_PATCH | Freq: Once | TRANSDERMAL | Status: DC | PRN

## 2015-09-12 MED ORDER — OXYCODONE-ACETAMINOPHEN 5-325 MG PO TABS: 1.0000 | ORAL_TABLET | ORAL | Status: DC | PRN

## 2015-09-12 MED ORDER — CLINDAMYCIN PHOSPHATE 300 MG/50ML IV SOLN
300.0000 mg | Freq: Once | INTRAVENOUS | Status: AC
  Administered 2015-09-12: 900 mg via INTRAVENOUS

## 2015-09-12 MED ORDER — MIDAZOLAM HCL 2 MG/2ML IJ SOLN: 1.0000 mg | INTRAMUSCULAR | Status: DC | PRN

## 2015-09-12 MED ORDER — FENTANYL CITRATE (PF) 100 MCG/2ML IJ SOLN
50.0000 ug | INTRAMUSCULAR | Status: DC | PRN
  Administered 2015-09-12: 50 ug via INTRAVENOUS

## 2015-09-12 MED ORDER — GLYCOPYRROLATE 0.2 MG/ML IJ SOLN
0.2000 mg | Freq: Once | INTRAMUSCULAR | Status: AC | PRN
  Administered 2015-09-12: 0.2 mg via INTRAVENOUS

## 2015-09-12 MED ORDER — MEPERIDINE HCL 25 MG/ML IJ SOLN: 6.2500 mg | INTRAMUSCULAR | Status: DC | PRN

## 2015-09-12 MED ORDER — DEXAMETHASONE SODIUM PHOSPHATE 10 MG/ML IJ SOLN
INTRAMUSCULAR | Status: AC
  Filled 2015-09-12: qty 1

## 2015-09-12 MED ORDER — ONDANSETRON HCL 4 MG/2ML IJ SOLN
INTRAMUSCULAR | Status: AC
  Filled 2015-09-12: qty 2

## 2015-09-12 MED ORDER — FENTANYL CITRATE (PF) 100 MCG/2ML IJ SOLN
25.0000 ug | INTRAMUSCULAR | Status: DC | PRN
  Administered 2015-09-12: 25 ug via INTRAVENOUS

## 2015-09-12 MED ORDER — LIDOCAINE HCL (CARDIAC) 20 MG/ML IV SOLN
INTRAVENOUS | Status: DC | PRN
Start: 2015-09-12 — End: 2015-09-12
  Administered 2015-09-12: 100 mg via INTRAVENOUS

## 2015-09-12 MED ORDER — PROPOFOL 10 MG/ML IV BOLUS
INTRAVENOUS | Status: DC | PRN
  Administered 2015-09-12: 160 mg via INTRAVENOUS

## 2015-09-12 MED ORDER — FENTANYL CITRATE (PF) 100 MCG/2ML IJ SOLN
INTRAMUSCULAR | Status: AC
  Filled 2015-09-12: qty 2

## 2015-09-12 MED ORDER — CHLORHEXIDINE GLUCONATE 4 % EX LIQD: 1.0000 "application " | Freq: Once | CUTANEOUS | Status: DC

## 2015-09-12 MED ORDER — CLINDAMYCIN PHOSPHATE 900 MG/50ML IV SOLN
INTRAVENOUS | Status: AC
  Filled 2015-09-12: qty 50

## 2015-09-12 MED ORDER — DEXAMETHASONE SODIUM PHOSPHATE 4 MG/ML IJ SOLN
INTRAMUSCULAR | Status: DC | PRN
  Administered 2015-09-12: 10 mg via INTRAVENOUS

## 2015-09-12 MED ORDER — PHENYLEPHRINE HCL 10 MG/ML IJ SOLN
INTRAMUSCULAR | Status: DC | PRN
  Administered 2015-09-12 (×2): 80 ug via INTRAVENOUS

## 2015-09-12 MED ORDER — ONDANSETRON HCL 4 MG/2ML IJ SOLN
INTRAMUSCULAR | Status: DC | PRN
  Administered 2015-09-12: 4 mg via INTRAVENOUS

## 2015-09-12 MED ORDER — LIDOCAINE HCL (CARDIAC) 20 MG/ML IV SOLN
INTRAVENOUS | Status: AC
  Filled 2015-09-12: qty 5

## 2015-09-12 MED ORDER — BUPIVACAINE-EPINEPHRINE (PF) 0.25% -1:200000 IJ SOLN
INTRAMUSCULAR | Status: DC | PRN
  Administered 2015-09-12: 17 mL

## 2015-09-12 SURGICAL SUPPLY — 39 items
BINDER BREAST MEDIUM (GAUZE/BANDAGES/DRESSINGS) ×3 IMPLANT
BLADE SURG 15 STRL LF DISP TIS (BLADE) ×1 IMPLANT
BLADE SURG 15 STRL SS (BLADE) ×2
CHLORAPREP W/TINT 26ML (MISCELLANEOUS) ×3 IMPLANT
COVER BACK TABLE 60X90IN (DRAPES) ×3 IMPLANT
COVER MAYO STAND STRL (DRAPES) ×3 IMPLANT
COVER PROBE W GEL 5X96 (DRAPES) ×3 IMPLANT
DEVICE DUBIN W/COMP PLATE 8390 (MISCELLANEOUS) ×3 IMPLANT
DRAPE LAPAROTOMY 100X72 PEDS (DRAPES) ×3 IMPLANT
DRAPE UTILITY XL STRL (DRAPES) ×3 IMPLANT
ELECT COATED BLADE 2.86 ST (ELECTRODE) ×3 IMPLANT
ELECT REM PT RETURN 9FT ADLT (ELECTROSURGICAL) ×3
ELECTRODE REM PT RTRN 9FT ADLT (ELECTROSURGICAL) ×1 IMPLANT
GLOVE BIOGEL PI IND STRL 7.0 (GLOVE) ×2 IMPLANT
GLOVE BIOGEL PI IND STRL 8 (GLOVE) ×1 IMPLANT
GLOVE BIOGEL PI INDICATOR 7.0 (GLOVE) ×4
GLOVE BIOGEL PI INDICATOR 8 (GLOVE) ×2
GLOVE ECLIPSE 6.5 STRL STRAW (GLOVE) ×3 IMPLANT
GLOVE ECLIPSE 8.0 STRL XLNG CF (GLOVE) ×6 IMPLANT
GOWN STRL REUS W/ TWL LRG LVL3 (GOWN DISPOSABLE) ×2 IMPLANT
GOWN STRL REUS W/TWL LRG LVL3 (GOWN DISPOSABLE) ×4
HEMOSTAT SNOW SURGICEL 2X4 (HEMOSTASIS) IMPLANT
KIT MARKER MARGIN INK (KITS) ×3 IMPLANT
LIQUID BAND (GAUZE/BANDAGES/DRESSINGS) ×3 IMPLANT
NEEDLE HYPO 25X1 1.5 SAFETY (NEEDLE) ×3 IMPLANT
NS IRRIG 1000ML POUR BTL (IV SOLUTION) IMPLANT
PACK BASIN DAY SURGERY FS (CUSTOM PROCEDURE TRAY) ×3 IMPLANT
PENCIL BUTTON HOLSTER BLD 10FT (ELECTRODE) ×3 IMPLANT
SLEEVE SCD COMPRESS KNEE MED (MISCELLANEOUS) ×3 IMPLANT
SPONGE LAP 4X18 X RAY DECT (DISPOSABLE) ×3 IMPLANT
SUT MNCRL AB 4-0 PS2 18 (SUTURE) ×3 IMPLANT
SUT SILK 2 0 SH (SUTURE) IMPLANT
SUT VICRYL 3-0 CR8 SH (SUTURE) ×3 IMPLANT
SYR CONTROL 10ML LL (SYRINGE) ×3 IMPLANT
TOWEL OR 17X24 6PK STRL BLUE (TOWEL DISPOSABLE) ×3 IMPLANT
TOWEL OR NON WOVEN STRL DISP B (DISPOSABLE) ×3 IMPLANT
TUBE CONNECTING 20'X1/4 (TUBING)
TUBE CONNECTING 20X1/4 (TUBING) IMPLANT
YANKAUER SUCT BULB TIP NO VENT (SUCTIONS) IMPLANT

## 2015-09-12 NOTE — Anesthesia Postprocedure Evaluation (Signed)
Anesthesia Post Note  Patient: Katelyn [name]  Procedure(s) Performed: Procedure(s) (LRB): RIGHT BREAST LUMPECTOMY WITH RADIOACTIVE SEED LOCALIZATION (Right)  Patient location during evaluation: PACU Anesthesia Type: General Level of consciousness: awake and alert Pain management: pain level controlled Vital Signs Assessment: post-procedure vital signs reviewed and stable Respiratory status: spontaneous breathing, nonlabored ventilation, respiratory function stable and patient connected to nasal cannula oxygen Cardiovascular status: blood pressure returned to baseline and stable Postop Assessment: no signs of nausea or vomiting Anesthetic complications: no    Last Vitals:  Filed Vitals:   09/12/15 1400 09/12/15 1415  BP: 140/72 133/65  Pulse: 66 63  Temp:    Resp: 13 13    Last Pain:  Filed Vitals:   09/12/15 1422  PainSc: 2                  Montez Hageman

## 2015-09-12 NOTE — Op Note (Signed)
eoperative diagnosis: Right breast atypical ductal hyperplasia  Postoperative diagnosis: Same   Procedure: Right  breast seed localized lumpectomy  Surgeon: Erroll Luna M.D.  Anesthesia: Gen. With 0.25% Sensorcaine local with epinephrine   EBL: 20 cc  Specimen: right  breast tissue with clip and radioactive seed in the specimen. Verified with neoprobe and radiographic image showing both seed and clip in specimen  Indications for procedure: The patient presents for RIGHT  lumpectomy after core biopsy showed ADH. Discussed the rationale for considering excision. Small risk of malignancy associated with THIS  lesion after core biopsy. Discussed observation. Discussed wire localization. Patient desired excision of RIGHT BREAST ADH.  The procedure has been discussed with the patient. Alternatives to surgery have been discussed with the patient.  Risks of surgery include bleeding,  Infection,  Seroma formation, death,  and the need for further surgery.   The patient understands and wishes to proceed.   Description of procedure: Patient underwent seed placement as an outpatient. Patient presents today for RIGHT  breast seed localized lumpectomy. Patient and holding area. Questions are answered and neoprobe used to verify seed location. Patient taken back to the operating room and placed  Supine upon the OR table. After induction of general anesthesia, right  breast prepped and draped in a sterile fashion. Timeout was done to verify proper  procedure. Neoprobe used and hot spot identified and right  breast below the nipple.  . This was marked with pen. Curvilinear incision made around the inferior border of the nipple . Dissection used with the help of a neoprobe around the tissue where the seed and clip were located.The  Tissue was  removed in its entirety with gross negative  margins.Marlis Edelson used and seed within the  specimen. Radiographs taken which show clip and seed  In specimen. Hemostasis  achieved and cavity closed with 3-0 Vicryl and 4-0 Monocryl. Liquid adhesive  applied. All final counts found to be correct. Specimen transported to pathology. Patient awoke extubated taken to recovery in satisfactory condition.

## 2015-09-12 NOTE — Discharge Instructions (Signed)
[facility] °Office Phone Number [phone] ° °BREAST BIOPSY/ PARTIAL MASTECTOMY: POST OP INSTRUCTIONS ° °Always review your discharge instruction sheet given to you by the facility where your surgery was performed. ° °IF YOU HAVE DISABILITY OR FAMILY LEAVE FORMS, YOU MUST BRING THEM TO THE OFFICE FOR PROCESSING.  DO NOT GIVE THEM TO YOUR DOCTOR. ° °1. A prescription for pain medication may be given to you upon discharge.  Take your pain medication as prescribed, if needed.  If narcotic pain medicine is not needed, then you may take acetaminophen (Tylenol) or ibuprofen (Advil) as needed. °2. Take your usually prescribed medications unless otherwise directed °3. If you need a refill on your pain medication, please contact your pharmacy.  They will contact our office to request authorization.  Prescriptions will not be filled after 5pm or on week-ends. °4. You should eat very light the first 24 hours after surgery, such as soup, crackers, pudding, etc.  Resume your normal diet the day after surgery. °5. Most patients will experience some swelling and bruising in the breast.  Ice packs and a good support bra will help.  Swelling and bruising can take several days to resolve.  °6. It is common to experience some constipation if taking pain medication after surgery.  Increasing fluid intake and taking a stool softener will usually help or prevent this problem from occurring.  A mild laxative (Milk of Magnesia or Miralax) should be taken according to package directions if there are no bowel movements after 48 hours. °7. Unless discharge instructions indicate otherwise, you may remove your bandages 24-48 hours after surgery, and you may shower at that time.  You may have steri-strips (small skin tapes) in place directly over the incision.  These strips should be left on the skin for 7-10 days.  If your surgeon used skin glue on the incision, you may shower in 24 hours.  The glue will flake off over the  next 2-3 weeks.  Any sutures or staples will be removed at the office during your follow-up visit. °8. ACTIVITIES:  You may resume regular daily activities (gradually increasing) beginning the next day.  Wearing a good support bra or sports bra minimizes pain and swelling.  You may have sexual intercourse when it is comfortable. °a. You may drive when you no longer are taking prescription pain medication, you can comfortably wear a seatbelt, and you can safely maneuver your car and apply brakes. °b. RETURN TO WORK:  ______________________________________________________________________________________ °9. You should see your doctor in the office for a follow-up appointment approximately two weeks after your surgery.  Your doctor’s nurse will typically make your follow-up appointment when she calls you with your pathology report.  Expect your pathology report 2-3 business days after your surgery.  You may call to check if you do not hear from us after three days. °10. OTHER INSTRUCTIONS: _______________________________________________________________________________________________ _____________________________________________________________________________________________________________________________________ °_____________________________________________________________________________________________________________________________________ °_____________________________________________________________________________________________________________________________________ ° °WHEN TO CALL YOUR DOCTOR: °1. Fever over 101.0 °2. Nausea and/or vomiting. °3. Extreme swelling or bruising. °4. Continued bleeding from incision. °5. Increased pain, redness, or drainage from the incision. ° °The clinic staff is available to answer your questions during regular business hours.  Please don’t hesitate to call and ask to speak to one of the nurses for clinical concerns.  If you have a medical emergency, go to the nearest  emergency room or call 911.  A surgeon from [facility] is always on call at the hospital. ° °For further questions, please visit [email or web address]  ° ° ° °  Post Anesthesia Home Care Instructions ° °Activity: °Get plenty of rest for the remainder of the day. A responsible adult should stay with you for 24 hours following the procedure.  °For the next 24 hours, DO NOT: °-Drive a car °-Operate machinery °-Drink alcoholic beverages °-Take any medication unless instructed by your physician °-Make any legal decisions or sign important papers. ° °Meals: °Start with liquid foods such as gelatin or soup. Progress to regular foods as tolerated. Avoid greasy, spicy, heavy foods. If nausea and/or vomiting occur, drink only clear liquids until the nausea and/or vomiting subsides. Call your physician if vomiting continues. ° °Special Instructions/Symptoms: °Your throat may feel dry or sore from the anesthesia or the breathing tube placed in your throat during surgery. If this causes discomfort, gargle with warm salt water. The discomfort should disappear within 24 hours. ° °If you had a scopolamine patch placed behind your ear for the management of post- operative nausea and/or vomiting: ° °1. The medication in the patch is effective for 72 hours, after which it should be removed.  Wrap patch in a tissue and discard in the trash. Wash hands thoroughly with soap and water. °2. You may remove the patch earlier than 72 hours if you experience unpleasant side effects which may include dry mouth, dizziness or visual disturbances. °3. Avoid touching the patch. Wash your hands with soap and water after contact with the patch. °  ° °

## 2015-09-12 NOTE — Transfer of Care (Signed)
Immediate Anesthesia Transfer of Care Note  Patient: Katelyn [name]  Procedure(s) Performed: Procedure(s): RIGHT BREAST LUMPECTOMY WITH RADIOACTIVE SEED LOCALIZATION (Right)  Patient Location: PACU  Anesthesia Type:General  Level of Consciousness: awake, sedated and patient cooperative  Airway & Oxygen Therapy: Patient Spontanous Breathing and Patient connected to face mask oxygen  Post-op Assessment: Report given to RN and Post -op Vital signs reviewed and stable  Post vital signs: Reviewed and stable  Last Vitals:  Filed Vitals:   09/12/15 1138 09/12/15 1142  BP: 139/67   Pulse: 67   Temp: 36.8 C 36.8 C  Resp: 16     Complications: No apparent anesthesia complications

## 2015-09-12 NOTE — H&P (View-Only) (Signed)
Katelyn [name] 08/19/2015 11:26 AM Location: [facility] Patient #: [name] DOB: [birth year] Married / Language: English / Race: White Female   History of Present Illness Marcello Moores A. Shadoe Cryan MD; 08/19/2015 11:50 AM) Patient words: breast f/u   Patient sent at the request of Dr. Dorise Bullion for a mammographic abnormality right breast detected on screening mammogram. Patient is a 6 mm lesion in the right inferior medial breast core biopsy proven to be atypical ductal hyperplasia. Patient denies history of breast pain, breast mass or any other breast issue.          CLINICAL DATA: Patient was called back from screening mammogram for possible mass in the right breast. EXAM: DIGITAL DIAGNOSTIC RIGHT MAMMOGRAM WITH 3D TOMOSYNTHESIS ULTRASOUND RIGHT BREAST COMPARISON: Previous exam(s). ACR Breast Density Category b: There are scattered areas of fibroglandular density. FINDINGS: Additional imaging of the right breast was obtained. There is persistence of a developing 4 mm mass compared to the prior exams. It is associated with 2 punctate calcifications. On physical exam, I do not palpate a mass in the inferior aspect of the right breast. Targeted ultrasound is performed, showing there is a mildly dilated duct in the 6 o'clock subareolar region of the right breast. It has several punctate echogenic foci consistent with calcifications. The area measures 5 x 1 x 5 mm. It is thought to correspond with the mammographic abnormality. IMPRESSION: Indeterminate mass in the 6 o'clock region of the right breast. The lesion is thought to be seen better mammographically therefore stereotactic biopsy recommended. RECOMMENDATION: Stereotactic biopsy of the right breast mass is recommended. This will be scheduled at the patient's convenience. I have discussed the findings and recommendations with the patient. Results were also provided in writing at the conclusion of the visit.  If applicable, a reminder letter will be sent to the patient regarding the next appointment. BI-RADS CATEGORY 4: Suspicious. Electronically Signed By: Lillia Mountain M.D. On: 08/05/2015 10:55    Diagnosis Breast, right, needle core biopsy, lower inner - ATYPICAL DUCTAL HYPERPLASIA WITH CALCIFICATIONS. - FIBROCYSTIC CHANGES. - PSEUDOANGIOMATOUS STROMAL HYPERPLASIA ([place]). - SEE COMMENT. Microscopic Comm             CLINICAL DATA: Evaluate marker placement EXAM: DIAGNOSTIC RIGHT MAMMOGRAM POST STEREOTACTIC BIOPSY COMPARISON: Previous exam(s). FINDINGS: Mammographic images were obtained following stereotactic guided biopsy of a right breast mass with calcifications. The X shaped marker is located 16 mm superior to the biopsied mass and calcifications. IMPRESSION: The biopsy marker is located 16 mm superior to the biopsied mass and calcifications. Final Assessment: Post Procedure Mammograms for Marker Placement .  The patient is a 75 year old female.   Other Problems [name], CMA; 08/19/2015 11:26 AM) High blood pressure Hypercholesterolemia Lump In Breast Melanoma Oophorectomy  Past Surgical History [name], [place]; 08/19/2015 11:26 AM) Breast Biopsy Right. Hysterectomy (not due to cancer) - Complete Oral Surgery Tonsillectomy  Diagnostic Studies History [name], CMA; 08/19/2015 11:26 AM) Colonoscopy 1-5 years ago Mammogram within last year Pap Smear 1-5 years ago  Allergies [name], CMA; 08/19/2015 11:28 AM) PenicillAMINE *ASSORTED CLASSES* Tetracycline *CHEMICALS*  Medication History ([name], CMA; 08/19/2015 11:28 AM) Aspirin (81MG  Tablet DR, Oral) Active. Citracal + D (250-200MG -UNIT Tablet, Oral) Active. Vitamin D3 (2000UNIT Capsule, Oral) Active. Vitamin B12 (100MCG Tablet, Oral) Active. Estradiol (0.075MG /24HR Patch TW, Transdermal) Active. Tenormin (50MG  Tablet, Oral) Active. HydroCHLOROthiazide  (12.5MG  Tablet, Oral) Active. Mobic (15MG  Tablet, Oral) Active. Potassium Chloride ER (10MEQ Tablet ER, Oral) Active. Pravastatin Sodium (40MG   Tablet, Oral) Active. Medications Reconciled  Social History [name], CMA; 08/19/2015 11:26 AM) Caffeine use Carbonated beverages, Coffee, Tea. No alcohol use No drug use Tobacco use Never smoker.  Family History [name], [place]; 08/19/2015 11:26 AM) Arthritis Brother, Mother, Sister. Cerebrovascular Accident Mother. Hypertension Brother, Mother. Melanoma Father. Thyroid problems Mother.  Pregnancy / Birth History [name], [place]; 08/19/2015 11:26 AM) Age at menarche 42 years. Age of menopause 63-55 Gravida 3 Maternal age 67-25 Para 2    Review of Systems ([place]; 08/19/2015 11:27 AM) General Not Present- Appetite Loss, Chills, Fatigue, Fever, Night Sweats, Weight Gain and Weight Loss. Skin Not Present- Change in Wart/Mole, Dryness, Hives, Jaundice, New Lesions, Non-Healing Wounds, Rash and Ulcer. HEENT Present- Wears glasses/contact lenses. Not Present- Earache, Hearing Loss, Hoarseness, Nose Bleed, Oral Ulcers, Ringing in the Ears, Seasonal Allergies, Sinus Pain, Sore Throat, Visual Disturbances and Yellow Eyes. Respiratory Not Present- Bloody sputum, Chronic Cough, Difficulty Breathing, Snoring and Wheezing. Breast Present- Breast Mass. Not Present- Breast Pain, Nipple Discharge and Skin Changes. Cardiovascular Present- Palpitations. Not Present- Chest Pain, Difficulty Breathing Lying Down, Leg Cramps, Rapid Heart Rate, Shortness of Breath and Swelling of Extremities. Gastrointestinal Not Present- Abdominal Pain, Bloating, Bloody Stool, Change in Bowel Habits, Chronic diarrhea, Constipation, Difficulty Swallowing, Excessive gas, Gets full quickly at meals, Hemorrhoids, Indigestion, Nausea, Rectal Pain and Vomiting. Female Genitourinary Not Present- Frequency, Nocturia, Painful Urination, Pelvic Pain and  Urgency. Musculoskeletal Not Present- Back Pain, Joint Pain, Joint Stiffness, Muscle Pain, Muscle Weakness and Swelling of Extremities. Neurological Not Present- Decreased Memory, Fainting, Headaches, Numbness, Seizures, Tingling, Tremor, Trouble walking and Weakness. Psychiatric Not Present- Anxiety, Bipolar, Change in Sleep Pattern, Depression, Fearful and Frequent crying. Endocrine Present- Hot flashes. Not Present- Cold Intolerance, Excessive Hunger, Hair Changes, Heat Intolerance and New Diabetes. Hematology Not Present- Easy Bruising, Excessive bleeding, Gland problems, HIV and Persistent Infections.  Vitals ([name] CMA; 08/19/2015 11:27 AM) 08/19/2015 11:27 AM Weight: 130 lb Height: 60in Body Surface Area: 1.55 m Body Mass Index: 25.39 kg/m  Temp.: 36F(Temporal)  Pulse: 75 (Regular)  BP: 128/76 (Sitting, Left Arm, Standard)       Physical Exam (Miguelina Fore A. [place]ya [place] MD; 08/19/2015 11:51 AM) General Mental Status-Alert. General Appearance-Consistent with stated age. Hydration-Well hydrated. Voice-Normal.  Head and Neck Head-normocephalic, atraumatic with no lesions or palpable masses. Trachea-midline. Thyroid Gland Characteristics - normal size and consistency.  Breast Breast - Left-Symmetric, Non Tender, No Biopsy scars, no Dimpling, No Inflammation, No Lumpectomy scars, No Mastectomy scars, No Peau d' Orange. Breast - Right-Symmetric, Non Tender, No Biopsy scars, no Dimpling, No Inflammation, No Lumpectomy scars, No Mastectomy scars, No Peau d' Orange. Breast Lump-No Palpable Breast Mass.  Musculoskeletal Normal Exam - Left-Upper Extremity Strength Normal and Lower Extremity Strength Normal. Normal Exam - Right-Upper Extremity Strength Normal and Lower Extremity Strength Normal.  Lymphatic Head & Neck  General Head & Neck Lymphatics: Bilateral - Description - Normal. Axillary  General Axillary Region: Bilateral - Description -  Normal. Tenderness - Non Tender.    Assessment & Plan (Malena Timpone A. Edwardo Wojnarowski MD; 08/19/2015 11:56 AM) ATYPICAL DUCTAL HYPERPLASIA OF RIGHT BREAST (N60.91) Story: Risk of lumpectomy include bleeding, infection, seroma, more surgery, use of seed/wire, wound care, cosmetic deformity and the need for other treatments, death , blood clots, death. Pt agrees to proceed. Current Plans Pt Education - [facility] Breast Biopsy HCI: discussed with patient and provided information. The anatomy and the physiology was discussed. The pathophysiology and natural history of the disease was  discussed. Options were discussed and recommendations were made. Technique, risks, benefits, & alternatives were discussed. Risks such as stroke, heart attack, bleeding, indection, death, and other risks discussed. Questions answered. The patient agrees to proceed. You are being scheduled for surgery - Our schedulers will call you.  You should hear from our office's scheduling department within 5 working days about the location, date, and time of surgery. We try to make accommodations for patient's preferences in scheduling surgery, but sometimes the OR schedule or the surgeon's schedule prevents [place] from making those accommodations.  If you have not heard from our office [phone]) in 5 working days, call the office and ask for your surgeon's nurse.  If you have other questions about your diagnosis, plan, or surgery, call the office and ask for your surgeon's nurse.    Signed by Turner Daniels, MD (08/19/2015 11:57 AM

## 2015-09-12 NOTE — Interval H&P Note (Signed)
History and Physical Interval Note:  09/12/2015 12:09 PM  Katelyn [name]  has presented today for surgery, with the diagnosis of right breast atypiical ductal hyperplasia  The various methods of treatment have been discussed with the patient and family. After consideration of risks, benefits and other options for treatment, the patient has consented to  Procedure(s): RIGHT BREAST LUMPECTOMY WITH RADIOACTIVE SEED LOCALIZATION (Right) as a surgical intervention .  The patient's history has been reviewed, patient examined, no change in status, stable for surgery.  I have reviewed the patient's chart and labs.  Questions were answered to the patient's satisfaction.     [name]

## 2015-09-12 NOTE — Anesthesia Preprocedure Evaluation (Addendum)
Anesthesia Evaluation  Patient identified by MRN, date of birth, ID band Patient awake    Reviewed: Allergy & Precautions, NPO status , Patient's Chart, lab work & pertinent test results  Airway Mallampati: II  TM Distance: >3 FB Neck ROM: Full    Dental no notable dental hx.    Pulmonary neg pulmonary ROS,    Pulmonary exam normal breath sounds clear to auscultation       Cardiovascular hypertension, Pt. on medications + CAD (mild-mod nonobstructive CAD. medical managment)  Normal cardiovascular exam Rhythm:Regular Rate:Normal     Neuro/Psych negative neurological ROS  negative psych ROS   GI/Hepatic negative GI ROS, Neg liver ROS,   Endo/Other  negative endocrine ROS  Renal/GU negative Renal ROS  negative genitourinary   Musculoskeletal negative musculoskeletal ROS (+)   Abdominal   Peds negative pediatric ROS (+)  Hematology negative hematology ROS (+)   Anesthesia Other Findings   Reproductive/Obstetrics negative OB ROS                           Anesthesia Physical Anesthesia Plan  ASA: II  Anesthesia Plan: General   Post-op Pain Management:    Induction: Intravenous  Airway Management Planned: LMA  Additional Equipment:   Intra-op Plan:   Post-operative Plan: Extubation in OR  Informed Consent: I have reviewed the patients History and Physical, chart, labs and discussed the procedure including the risks, benefits and alternatives for the proposed anesthesia with the patient or authorized representative who has indicated his/her understanding and acceptance.   Dental advisory given  Plan Discussed with: CRNA  Anesthesia Plan Comments:         Anesthesia Quick Evaluation

## 2015-09-13 ENCOUNTER — Encounter (HOSPITAL_BASED_OUTPATIENT_CLINIC_OR_DEPARTMENT_OTHER): Payer: Self-pay | Admitting: Surgery

## 2015-09-19 ENCOUNTER — Encounter: Payer: Self-pay | Admitting: Internal Medicine

## 2015-09-19 ENCOUNTER — Ambulatory Visit (INDEPENDENT_AMBULATORY_CARE_PROVIDER_SITE_OTHER): Payer: PPO | Admitting: Internal Medicine

## 2015-09-19 VITALS — BP 130/90 | HR 72 | Temp 98.1°F | Resp 20 | Ht 60.0 in | Wt 127.0 lb

## 2015-09-19 DIAGNOSIS — I10 Essential (primary) hypertension: Secondary | ICD-10-CM

## 2015-09-19 DIAGNOSIS — J029 Acute pharyngitis, unspecified: Secondary | ICD-10-CM

## 2015-09-19 LAB — POCT RAPID STREP A (OFFICE): RAPID STREP A SCREEN: NEGATIVE

## 2015-09-19 NOTE — Patient Instructions (Signed)
Take [phone] mg of Tylenol every 6 hours as needed for pain relief or fever.  Avoid taking more than 3000 mg in a 24-hour period (  This may cause liver damage).  Use lozenges for sore throat discomfort as needed

## 2015-09-19 NOTE — Progress Notes (Signed)
Subjective:    Patient ID: Katelyn [name], female    DOB: 04/21/1941, 75 y.o.   MRN: [id number]  HPI  75 year old patient who is status post right lumpectomy with radioactive seed implantation last week for breast cancer.  She did quite well.  4 days ago, she developed mild sore throat.  There is been no fever or other constitutional complaints except for a depressed appetite.  She is quite concerned about strep pharyngitis.  She states she had strep pharyngitis 20 years ago that was quite refractory to treatment No fever No strep exposure  Past Medical History  Diagnosis Date  . Shortness of breath   . Palpitations   . Unspecified essential hypertension   . Other and unspecified hyperlipidemia   . Anxiety state, unspecified   . Osteoarthrosis, unspecified whether generalized or localized, unspecified site   . Disorder of bone and cartilage, unspecified      Social History   Social History  . Marital Status: Married    Spouse Name: N/A  . Number of Children: N/A  . Years of Education: N/A   Occupational History  . Not on file.   Social History Main Topics  . Smoking status: Never Smoker   . Smokeless tobacco: Never Used  . Alcohol Use: No  . Drug Use: No  . Sexual Activity: Not on file   Other Topics Concern  . Not on file   Social History Narrative    Past Surgical History  Procedure Laterality Date  . Total abdominal hysterectomy  2001  . Bilateral salpingoophorectomy  2001  . Tonsillectomy  1957  . Colonoscopy  [id number]  . Left heart catheterization with coronary angiogram N/A 06/26/2014    Procedure: LEFT HEART CATHETERIZATION WITH CORONARY ANGIOGRAM;  Surgeon: Leonie Man, MD;  Location: [facility] C[facility] LAB;  Service: Cardiovascular;  Laterality: N/A;  . Breast lumpectomy with radioactive seed localization Right 09/12/2015    Procedure: RIGHT BREAST LUMPECTOMY WITH RADIOACTIVE SEED LOCALIZATION;  Surgeon: Erroll Luna, MD;  Location: [place];  Service: General;  Laterality: Right;    Family History  Problem Relation Age of Onset  . Pneumonia Mother   . Asthma    . Allergies    . Colon cancer Neg Hx   . Stomach cancer Neg Hx     Allergies  Allergen Reactions  . Penicillins     REACTION: rash  . Tetracycline     REACTION: rash    Current Outpatient Prescriptions on File Prior to Visit  Medication Sig Dispense Refill  . aspirin 81 MG EC tablet Take 81 mg by mouth daily.      Marland Kitchen atenolol (TENORMIN) 50 MG tablet TAKE ONE TABLET BY MOUTH TWICE DAILY 180 tablet 1  . Calcium Citrate-Vitamin D (CITRACAL PETITES/VITAMIN D PO) Take 1 tablet by mouth daily.      . Cholecalciferol (VITAMIN D3) 2000 UNITS TABS Take 1 tablet by mouth daily.      . Cyanocobalamin (VITAMIN B-12 PO) Take 1 tablet by mouth daily.    . hydrochlorothiazide (HYDRODIURIL) 12.5 MG tablet TAKE ONE TABLET BY MOUTH ONCE DAILY 90 tablet 1  . potassium chloride (KLOR-CON M10) 10 MEQ tablet Take 2 tablets (20 mEq total) by mouth daily. 180 tablet 3  . pravastatin (PRAVACHOL) 40 MG tablet Take 1 tablet (40 mg total) by mouth at bedtime. 90 tablet 2  . [DISCONTINUED] potassium chloride (K-DUR) 10 MEQ tablet Take 2 capsules daily 180 tablet 3  No current facility-administered medications on file prior to visit.    BP 130/90 mmHg  Pulse 72  Temp(Src) 98.1 F (36.7 C) (Oral)  Resp 20  Ht 5' (1.524 m)  Wt 127 lb (57.607 kg)  BMI 24.80 kg/m2     Review of Systems  Constitutional: Positive for appetite change.  HENT: Positive for sore throat. Negative for congestion, dental problem, hearing loss, rhinorrhea, sinus pressure and tinnitus.   Eyes: Negative for pain, discharge and visual disturbance.  Respiratory: Negative for cough and shortness of breath.   Cardiovascular: Negative for chest pain, palpitations and leg swelling.  Gastrointestinal: Negative for nausea, vomiting, abdominal pain, diarrhea, constipation, blood in stool and abdominal  distention.  Genitourinary: Negative for dysuria, urgency, frequency, hematuria, flank pain, vaginal bleeding, vaginal discharge, difficulty urinating, vaginal pain and pelvic pain.  Musculoskeletal: Negative for joint swelling, arthralgias and gait problem.  Skin: Negative for rash.  Neurological: Negative for dizziness, syncope, speech difficulty, weakness, numbness and headaches.  Hematological: Negative for adenopathy.  Psychiatric/Behavioral: Negative for behavioral problems, dysphoric mood and agitation. The patient is not nervous/anxious.        Objective:   Physical Exam  Constitutional: She is oriented to person, place, and time. She appears well-developed and well-nourished.  HENT:  Head: Normocephalic.  Right Ear: External ear normal.  Left Ear: External ear normal.  Oropharynx is erythematous without exudate  Eyes: Conjunctivae and EOM are normal. Pupils are equal, round, and reactive to light.  Neck: Normal range of motion. Neck supple. No thyromegaly present.  No adenopathy  Cardiovascular: Normal rate, regular rhythm, normal heart sounds and intact distal pulses.   Pulmonary/Chest: Effort normal and breath sounds normal. No respiratory distress. She has no wheezes.  Abdominal: Soft. Bowel sounds are normal. She exhibits no mass. There is no tenderness.  Musculoskeletal: Normal range of motion.  Lymphadenopathy:    She has no cervical adenopathy.  Neurological: She is alert and oriented to person, place, and time.  Skin: Skin is warm and dry. No rash noted.  Psychiatric: She has a normal mood and affect. Her behavior is normal.          Assessment & Plan:   Mild pharyngitis.  Patient is quite concerned about strep infection.  Will screen for patient reassurance.  Will treat symptomatically Status post recent lumpectomy with radioactive seed implantation for breast cancer  General surgery follow-up

## 2015-09-19 NOTE — Progress Notes (Signed)
Pre visit review using our clinic review tool, if applicable. No additional management support is needed unless otherwise documented below in the visit note. 

## 2015-09-25 DIAGNOSIS — D239 Other benign neoplasm of skin, unspecified: Secondary | ICD-10-CM | POA: Diagnosis not present

## 2015-09-25 DIAGNOSIS — L821 Other seborrheic keratosis: Secondary | ICD-10-CM | POA: Diagnosis not present

## 2015-09-26 ENCOUNTER — Other Ambulatory Visit: Payer: Self-pay | Admitting: General Practice

## 2015-09-26 MED ORDER — HYDROCHLOROTHIAZIDE 12.5 MG PO TABS: 12.5000 mg | ORAL_TABLET | Freq: Every day | ORAL | Status: DC

## 2015-09-26 MED ORDER — ATENOLOL 50 MG PO TABS: 50.0000 mg | ORAL_TABLET | Freq: Two times a day (BID) | ORAL | Status: DC

## 2015-12-31 DIAGNOSIS — H04123 Dry eye syndrome of bilateral lacrimal glands: Secondary | ICD-10-CM | POA: Diagnosis not present

## 2015-12-31 DIAGNOSIS — H5203 Hypermetropia, bilateral: Secondary | ICD-10-CM | POA: Diagnosis not present

## 2015-12-31 DIAGNOSIS — H25813 Combined forms of age-related cataract, bilateral: Secondary | ICD-10-CM | POA: Diagnosis not present

## 2015-12-31 DIAGNOSIS — H52223 Regular astigmatism, bilateral: Secondary | ICD-10-CM | POA: Diagnosis not present

## 2016-02-05 DIAGNOSIS — R5383 Other fatigue: Secondary | ICD-10-CM | POA: Diagnosis not present

## 2016-02-05 DIAGNOSIS — R42 Dizziness and giddiness: Secondary | ICD-10-CM | POA: Diagnosis not present

## 2016-02-05 DIAGNOSIS — R002 Palpitations: Secondary | ICD-10-CM | POA: Diagnosis not present

## 2016-02-10 ENCOUNTER — Encounter: Payer: Self-pay | Admitting: Internal Medicine

## 2016-02-10 ENCOUNTER — Ambulatory Visit (INDEPENDENT_AMBULATORY_CARE_PROVIDER_SITE_OTHER): Payer: PPO | Admitting: Internal Medicine

## 2016-02-10 VITALS — BP 118/74 | HR 66 | Temp 98.3°F | Resp 18 | Ht 60.0 in | Wt 131.4 lb

## 2016-02-10 DIAGNOSIS — I2510 Atherosclerotic heart disease of native coronary artery without angina pectoris: Secondary | ICD-10-CM | POA: Diagnosis not present

## 2016-02-10 DIAGNOSIS — I10 Essential (primary) hypertension: Secondary | ICD-10-CM

## 2016-02-10 DIAGNOSIS — I2583 Coronary atherosclerosis due to lipid rich plaque: Secondary | ICD-10-CM

## 2016-02-10 DIAGNOSIS — E785 Hyperlipidemia, unspecified: Secondary | ICD-10-CM

## 2016-02-10 DIAGNOSIS — R002 Palpitations: Secondary | ICD-10-CM

## 2016-02-10 NOTE — Progress Notes (Signed)
Subjective:    Patient ID: Katelyn [name], female    DOB: 10-14-1940, 75 y.o.   MRN: [id number]  HPI 75 year old patient who has a history of nonobstructive coronary artery disease.  She was seen at the urgent care recently with a history of some postprandial shortness of breath.  She describes occasionally noting palpitations.  The past 2 months she has had some episodic mild shortness of breath and slight cough now.  She states that she is breathing easily and is back to baseline.  She also describes some neck pain associated with some discomfort in the left arm.  She describes intermittent dizziness. She has essential hypertension and has had palpitations in the past.  Medical regimen does include atenolol.  She has a history of anxiety disorder.  She also has a history of gastro-soft, reflux disease.  Past Medical History:  Diagnosis Date  . Anxiety state, unspecified   . Disorder of bone and cartilage, unspecified   . Osteoarthrosis, unspecified whether generalized or localized, unspecified site   . Other and unspecified hyperlipidemia   . Palpitations   . Shortness of breath   . Unspecified essential hypertension      Social History   Social History  . Marital status: Married    Spouse name: N/A  . Number of children: N/A  . Years of education: N/A   Occupational History  . Not on file.   Social History Main Topics  . Smoking status: Never Smoker  . Smokeless tobacco: Never Used  . Alcohol use No  . Drug use: No  . Sexual activity: Not on file   Other Topics Concern  . Not on file   Social History Narrative  . No narrative on file    Past Surgical History:  Procedure Laterality Date  . BILATERAL SALPINGOOPHORECTOMY  2001  . BREAST LUMPECTOMY WITH RADIOACTIVE SEED LOCALIZATION Right 09/12/2015   Procedure: RIGHT BREAST LUMPECTOMY WITH RADIOACTIVE SEED LOCALIZATION;  Surgeon: Erroll Luna, MD;  Location: [facility];  Service: General;   Laterality: Right;  . COLONOSCOPY  [id number]  . LEFT HEART CATHETERIZATION WITH CORONARY ANGIOGRAM N/A 06/26/2014   Procedure: LEFT HEART CATHETERIZATION WITH CORONARY ANGIOGRAM;  Surgeon: Leonie Man, MD;  Location: [facility] CATH LAB;  Service: Cardiovascular;  Laterality: N/A;  . TONSILLECTOMY  1957  . TOTAL ABDOMINAL HYSTERECTOMY  2001    Family History  Problem Relation Age of Onset  . Pneumonia Mother   . Asthma    . Allergies    . Colon cancer Neg Hx   . Stomach cancer Neg Hx     Allergies  Allergen Reactions  . Penicillins     REACTION: rash  . Tetracycline     REACTION: rash    Current Outpatient Prescriptions on File Prior to Visit  Medication Sig Dispense Refill  . aspirin 81 MG EC tablet Take 81 mg by mouth daily.      Marland Kitchen atenolol (TENORMIN) 50 MG tablet Take 1 tablet (50 mg total) by mouth 2 (two) times daily. 180 tablet 1  . Calcium Citrate-Vitamin D (CITRACAL PETITES/VITAMIN D PO) Take 1 tablet by mouth daily.      . Cholecalciferol (VITAMIN D3) 2000 UNITS TABS Take 1 tablet by mouth daily.      . Cyanocobalamin (VITAMIN B-12 PO) Take 1 tablet by mouth daily.    . hydrochlorothiazide (HYDRODIURIL) 12.5 MG tablet Take 1 tablet (12.5 mg total) by mouth daily. 90 tablet 1  . potassium chloride (  KLOR-CON M10) 10 MEQ tablet Take 2 tablets (20 mEq total) by mouth daily. 180 tablet 3  . pravastatin (PRAVACHOL) 40 MG tablet Take 1 tablet (40 mg total) by mouth at bedtime. 90 tablet 2  . [DISCONTINUED] potassium chloride (K-DUR) 10 MEQ tablet Take 2 capsules daily 180 tablet 3   No current facility-administered medications on file prior to visit.     BP 118/74 (BP Location: Right Arm, Patient Position: Sitting, Cuff Size: Normal)   Pulse 66   Temp 98.3 F (36.8 C) (Oral)   Resp 18   Ht 5' (1.524 m)   Wt 131 lb 6.1 oz (59.6 kg)   SpO2 98%   BMI 25.66 kg/m      Review of Systems  Constitutional: Negative.   HENT: Negative for congestion, dental problem, hearing  loss, rhinorrhea, sinus pressure, sore throat and tinnitus.   Eyes: Negative for pain, discharge and visual disturbance.  Respiratory: Positive for cough and shortness of breath.   Cardiovascular: Positive for palpitations. Negative for chest pain and leg swelling.  Gastrointestinal: Negative for abdominal distention, abdominal pain, blood in stool, constipation, diarrhea, nausea and vomiting.  Genitourinary: Negative for difficulty urinating, dysuria, flank pain, frequency, hematuria, pelvic pain, urgency, vaginal bleeding, vaginal discharge and vaginal pain.  Musculoskeletal: Positive for neck pain. Negative for arthralgias, gait problem and joint swelling.  Skin: Negative for rash.  Neurological: Negative for dizziness, syncope, speech difficulty, weakness, numbness and headaches.  Hematological: Negative for adenopathy.  Psychiatric/Behavioral: Negative for agitation, behavioral problems and dysphoric mood. The patient is not nervous/anxious.        Objective:   Physical Exam  Constitutional: She is oriented to person, place, and time. She appears well-developed and well-nourished.  HENT:  Head: Normocephalic.  Right Ear: External ear normal.  Left Ear: External ear normal.  Mouth/Throat: Oropharynx is clear and moist.  Eyes: Conjunctivae and EOM are normal. Pupils are equal, round, and reactive to light.  Neck: Normal range of motion. Neck supple. No thyromegaly present.  Cardiovascular: Normal rate, regular rhythm, normal heart sounds and intact distal pulses.   Pulmonary/Chest: Effort normal and breath sounds normal.  Abdominal: Soft. Bowel sounds are normal. She exhibits no mass. There is no tenderness.  Musculoskeletal: Normal range of motion.  Lymphadenopathy:    She has no cervical adenopathy.  Neurological: She is alert and oriented to person, place, and time.  Skin: Skin is warm and dry. No rash noted.  Psychiatric: She has a normal mood and affect. Her behavior is  normal.          Assessment & Plan:   Nonspecific postprandial shortness of breath.  History of reflux.  Patient reassured.  Trial of aggressive antireflux regimen and Pepcid AC twice a day.  She will report any new or worsening symptoms Nonobstructive coronary artery disease.  Will continue statin therapy and aspirin therapy History palpitations.  Continue atenolol.  Patient reassured Neck pain Dyslipidemia.  Continue statin therapy  Schedule CPX  Nyoka Cowden

## 2016-02-10 NOTE — Progress Notes (Signed)
Pre visit review using our clinic review tool, if applicable. No additional management support is needed unless otherwise documented below in the visit note. 

## 2016-02-10 NOTE — Patient Instructions (Addendum)
Avoids foods high in acid such as tomatoes citrus juices, and spicy foods.  Avoid eating within two hours of lying down or before exercising.  Do not overheat.  Try smaller more frequent meals.  If symptoms persist, try Pepcid AC twice daily  Food Choices for Gastroesophageal Reflux Disease, Adult When you have gastroesophageal reflux disease (GERD), the foods you eat and your eating habits are very important. Choosing the right foods can help ease the discomfort of GERD. WHAT GENERAL GUIDELINES DO I NEED TO FOLLOW?  Choose fruits, vegetables, whole grains, low-fat dairy products, and low-fat meat, fish, and poultry.  Limit fats such as oils, salad dressings, butter, nuts, and avocado.  Keep a food diary to identify foods that cause symptoms.  Avoid foods that cause reflux. These may be different for different people.  Eat frequent small meals instead of three large meals each day.  Eat your meals slowly, in a relaxed setting.  Limit fried foods.  Cook foods using methods other than frying.  Avoid drinking alcohol.  Avoid drinking large amounts of liquids with your meals.  Avoid bending over or lying down until 2-3 hours after eating. WHAT FOODS ARE NOT RECOMMENDED? The following are some foods and drinks that may worsen your symptoms: Vegetables Tomatoes. Tomato juice. Tomato and spaghetti sauce. Chili peppers. Onion and garlic. Horseradish. Fruits Oranges, grapefruit, and lemon (fruit and juice). Meats High-fat meats, fish, and poultry. This includes hot dogs, ribs, ham, sausage, salami, and bacon. Dairy Whole milk and chocolate milk. Sour cream. Cream. Butter. Ice cream. Cream cheese.  Beverages Coffee and tea, with or without caffeine. Carbonated beverages or energy drinks. Condiments Hot sauce. Barbecue sauce.  Sweets/Desserts Chocolate and cocoa. Donuts. Peppermint and spearmint. Fats and Oils High-fat foods, including [place] fries and potato chips. Other Vinegar.  Strong spices, such as black pepper, white pepper, red pepper, cayenne, curry powder, cloves, ginger, and chili powder. The items listed above may not be a complete list of foods and beverages to avoid. Contact your dietitian for more information.   This information is not intended to replace advice given to you by your health care provider. Make sure you discuss any questions you have with your health care provider.   Document Released: 05/11/2005 Document Revised: 06/01/2014 Document Reviewed: 03/15/2013 Elsevier Interactive Patient Education [employer].

## 2016-03-03 ENCOUNTER — Ambulatory Visit (INDEPENDENT_AMBULATORY_CARE_PROVIDER_SITE_OTHER): Payer: PPO

## 2016-03-03 DIAGNOSIS — Z23 Encounter for immunization: Secondary | ICD-10-CM

## 2016-03-17 ENCOUNTER — Ambulatory Visit (INDEPENDENT_AMBULATORY_CARE_PROVIDER_SITE_OTHER): Payer: PPO | Admitting: Internal Medicine

## 2016-03-17 ENCOUNTER — Encounter: Payer: Self-pay | Admitting: Internal Medicine

## 2016-03-17 ENCOUNTER — Encounter (INDEPENDENT_AMBULATORY_CARE_PROVIDER_SITE_OTHER): Payer: Self-pay

## 2016-03-17 VITALS — BP 114/66 | HR 62 | Ht 60.0 in | Wt 129.0 lb

## 2016-03-17 DIAGNOSIS — R195 Other fecal abnormalities: Secondary | ICD-10-CM | POA: Diagnosis not present

## 2016-03-17 DIAGNOSIS — R194 Change in bowel habit: Secondary | ICD-10-CM | POA: Diagnosis not present

## 2016-03-17 MED ORDER — METRONIDAZOLE 250 MG PO TABS: 250.0000 mg | ORAL_TABLET | Freq: Three times a day (TID) | ORAL | 0 refills | Status: DC

## 2016-03-17 NOTE — Progress Notes (Signed)
Katelyn [name] 75 y.o. 10/29/1940 [id number]  Assessment & Plan:   Encounter Diagnoses  Name Primary?  . Loose stools Yes  . Change in bowel habits    Several month hs of normal stool followed by a few looser stools each AM No worrisome features Last colonoscopy 2014 - negative except tics   Seems like IBS - given recent colonoscopy and lack of worrisome features - conservative Rx  Try probiotic, reduce coffee Metronidazole 250 mg tid x 7d Loperamide prn/scheduled If not better in 1 month call back  [id number] Pilar Plate, MD   Subjective:   Chief Complaint: diarrhea  HPI In past several mos hast started to have 2-3 soft/loose stools after a normal stool in AM No diet, medication or stress changes No sig abd pain .No bleeding Wt Readings from Last 3 Encounters:  03/17/16 129 lb (58.5 kg)  02/10/16 131 lb 6.1 oz (59.6 kg)  09/19/15 127 lb (57.6 kg)    Denies new stress, anxiety  She is worried about travelling to [facility] with friends soon and having to use the bathroom several times in AM  Allergies  Allergen Reactions  . Penicillins     REACTION: rash  . Tetracycline     REACTION: rash   Outpatient Medications Prior to Visit  Medication Sig Dispense Refill  . aspirin 81 MG EC tablet Take 81 mg by mouth daily.      Marland Kitchen atenolol (TENORMIN) 50 MG tablet Take 1 tablet (50 mg total) by mouth 2 (two) times daily. (Patient taking differently: Take 50 mg by mouth daily. ) 180 tablet 1  . Calcium Citrate-Vitamin D (CITRACAL PETITES/VITAMIN D PO) Take 1 tablet by mouth daily.      . Cholecalciferol (VITAMIN D3) 2000 UNITS TABS Take 1 tablet by mouth daily.      . Cyanocobalamin (VITAMIN B-12 PO) Take 1 tablet by mouth daily.    . hydrochlorothiazide (HYDRODIURIL) 12.5 MG tablet Take 1 tablet (12.5 mg total) by mouth daily. 90 tablet 1  . potassium chloride (KLOR-CON M10) 10 MEQ tablet Take 2 tablets (20 mEq total) by mouth daily. 180 tablet 3  .  pravastatin (PRAVACHOL) 40 MG tablet Take 1 tablet (40 mg total) by mouth at bedtime. 90 tablet 2   No facility-administered medications prior to visit.    Past Medical History:  Diagnosis Date  . Anxiety state, unspecified   . Disorder of bone and cartilage, unspecified   . Osteoarthrosis, unspecified whether generalized or localized, unspecified site   . Other and unspecified hyperlipidemia   . Palpitations   . Shortness of breath   . Unspecified essential hypertension    Past Surgical History:  Procedure Laterality Date  . BILATERAL SALPINGOOPHORECTOMY  2001  . BREAST LUMPECTOMY WITH RADIOACTIVE SEED LOCALIZATION Right 09/12/2015   Procedure: RIGHT BREAST LUMPECTOMY WITH RADIOACTIVE SEED LOCALIZATION;  Surgeon: Erroll Luna, MD;  Location: [place];  Service: General;  Laterality: Right;  . COLONOSCOPY  [id number]  . LEFT HEART CATHETERIZATION WITH CORONARY ANGIOGRAM N/A 06/26/2014   Procedure: LEFT HEART CATHETERIZATION WITH CORONARY ANGIOGRAM;  Surgeon: Leonie Man, MD;  Location[facility] C[facility] LAB;  Service: Cardiovascular;  Laterality: N/A;  . TONSILLECTOMY  1957  . TOTAL ABDOMINAL HYSTERECTOMY  2001   Social History   Social History  . Marital status: Married    Spouse name: N/A  . Number of children: N/A  . Years of education: N/A   Social History Main Topics  . Smoking  status: Never Smoker  . Smokeless tobacco: Never Used  . Alcohol use No  . Drug use: No  . Sexual activity: Not Asked   Other Topics Concern  . None   Social History Narrative  . None   Family History  Problem Relation Age of Onset  . Pneumonia Mother   . Asthma    . Allergies    . Colon cancer Neg Hx   . Stomach cancer Neg Hx     Review of Systems As above   Objective:   Physical Exam @BP  114/66   Pulse 62   Ht 5' (1.524 m)   Wt 129 lb (58.5 kg)   BMI 25.19 kg/m @  General:  NAD Eyes:   anicteric Lungs:  clear Heart::  S1S2 no rubs, murmurs or  gallops Abdomen:  soft and nontender, BS+ Ext:   no edema, cyanosis or clubbing  Data Reviewed:     25 minutes time spent with patient

## 2016-03-17 NOTE — Patient Instructions (Signed)
  We have sent the following medications to your pharmacy for you to pick up at your convenience: Flagyl  Take 1-2 Imodium AD every AM.   In one - two months if no better call us back.    I appreciate the opportunity to care for you. Ronney Lion, [facility]

## 2016-03-23 ENCOUNTER — Other Ambulatory Visit: Payer: Self-pay | Admitting: Internal Medicine

## 2016-05-21 ENCOUNTER — Other Ambulatory Visit: Payer: Self-pay | Admitting: Internal Medicine

## 2016-07-10 ENCOUNTER — Other Ambulatory Visit: Payer: Self-pay | Admitting: Internal Medicine

## 2016-07-10 ENCOUNTER — Other Ambulatory Visit (INDEPENDENT_AMBULATORY_CARE_PROVIDER_SITE_OTHER): Payer: PPO

## 2016-07-10 DIAGNOSIS — Z0000 Encounter for general adult medical examination without abnormal findings: Secondary | ICD-10-CM

## 2016-07-10 LAB — POC URINALSYSI DIPSTICK (AUTOMATED)
BILIRUBIN UA: NEGATIVE
GLUCOSE UA: NEGATIVE
KETONES UA: NEGATIVE
LEUKOCYTES UA: NEGATIVE
NITRITE UA: NEGATIVE
Spec Grav, UA: 1.02
Urobilinogen, UA: 0.2
pH, UA: 6

## 2016-07-10 LAB — LIPID PANEL
CHOL/HDL RATIO: 4
Cholesterol: 205 mg/dL — ABNORMAL HIGH (ref 0–200)
HDL: 51.3 mg/dL (ref >39.00)
LDL Cholesterol: 120 mg/dL — ABNORMAL HIGH (ref 0–99)
NonHDL: 153.49
TRIGLYCERIDES: 168 mg/dL — AB (ref 0.0–149.0)
VLDL: 33.6 mg/dL (ref 0.0–40.0)

## 2016-07-10 LAB — BASIC METABOLIC PANEL
BUN: 20 mg/dL (ref 6–23)
CALCIUM: 9.4 mg/dL (ref 8.4–10.5)
CO2: 30 mEq/L (ref 19–32)
Chloride: 104 mEq/L (ref 96–112)
Creatinine, Ser: 0.78 mg/dL (ref 0.40–1.20)
GFR: 76.62 mL/min (ref >60.00)
GLUCOSE: 95 mg/dL (ref 70–99)
POTASSIUM: 4.1 meq/L (ref 3.5–5.1)
SODIUM: 140 meq/L (ref 135–145)

## 2016-07-10 LAB — CBC WITH DIFFERENTIAL/PLATELET
BASOS ABS: 0 10*3/uL (ref 0.0–0.1)
Basophils Relative: 0.5 % (ref 0.0–3.0)
EOS PCT: 3.8 % (ref 0.0–5.0)
Eosinophils Absolute: 0.3 10*3/uL (ref 0.0–0.7)
HCT: 41 % (ref 36.0–46.0)
HEMOGLOBIN: 14.2 g/dL (ref 12.0–15.0)
LYMPHS ABS: 2.2 10*3/uL (ref 0.7–4.0)
LYMPHS PCT: 32.1 % (ref 12.0–46.0)
MCHC: 34.7 g/dL (ref 30.0–36.0)
MCV: 90.6 fl (ref 78.0–100.0)
MONOS PCT: 7.3 % (ref 3.0–12.0)
Monocytes Absolute: 0.5 10*3/uL (ref 0.1–1.0)
NEUTROS PCT: 56.3 % (ref 43.0–77.0)
Neutro Abs: 3.9 10*3/uL (ref 1.4–7.7)
Platelets: 277 10*3/uL (ref 150.0–400.0)
RBC: 4.52 Mil/uL (ref 3.87–5.11)
RDW: 12.8 % (ref 11.5–15.5)
WBC: 7 10*3/uL (ref 4.0–10.5)

## 2016-07-10 LAB — HEPATIC FUNCTION PANEL
ALBUMIN: 4.2 g/dL (ref 3.5–5.2)
ALT: 18 U/L (ref 0–35)
AST: 18 U/L (ref 0–37)
Alkaline Phosphatase: 47 U/L (ref 39–117)
Bilirubin, Direct: 0.1 mg/dL (ref 0.0–0.3)
Total Bilirubin: 0.7 mg/dL (ref 0.2–1.2)
Total Protein: 6.6 g/dL (ref 6.0–8.3)

## 2016-07-10 LAB — TSH: TSH: 1.33 u[IU]/mL (ref 0.35–4.50)

## 2016-07-10 NOTE — Telephone Encounter (Signed)
Last refilled 07/23/2015. Pt would like a refill.

## 2016-07-10 NOTE — Telephone Encounter (Signed)
Okay to refill? 

## 2016-07-17 ENCOUNTER — Encounter: Payer: PPO | Admitting: Internal Medicine

## 2016-07-27 ENCOUNTER — Encounter: Payer: Self-pay | Admitting: Internal Medicine

## 2016-07-27 ENCOUNTER — Ambulatory Visit (INDEPENDENT_AMBULATORY_CARE_PROVIDER_SITE_OTHER): Payer: PPO | Admitting: Internal Medicine

## 2016-07-27 VITALS — BP 122/70 | HR 69 | Temp 97.8°F | Ht 60.0 in | Wt 133.6 lb

## 2016-07-27 DIAGNOSIS — Z0000 Encounter for general adult medical examination without abnormal findings: Secondary | ICD-10-CM | POA: Diagnosis not present

## 2016-07-27 NOTE — Progress Notes (Signed)
Subjective:    Patient ID: Katelyn [name], female    DOB: 1940/07/10, 76 y.o.   MRN: [id number]  HPI  76 year old patient who is seen today for an annual preventive health examination as well as a Medicare wellness visit  She does quite well. She has a history of essential hypertension as well as mild dyslipidemia.  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 Last year, she underwent breast biopsy for benign disease. Doing quite well today without concerns or complaints  She has a history of microscopic hematuria and was evaluated by urology approximately 2 years ago Last colonoscopy March 2014 Heart catheterization, February 2016 2-D echocardiogram, February 2016 Last mammogram April 2017  Past Medical History:  Diagnosis Date  . Anxiety state, unspecified   . Disorder of bone and cartilage, unspecified   . Osteoarthrosis, unspecified whether generalized or localized, unspecified site   . Other and unspecified hyperlipidemia   . Palpitations   . Shortness of breath   . Unspecified essential hypertension      Social History   Social History  . Marital status: Married    Spouse name: N/A  . Number of children: N/A  . Years of education: N/A   Occupational History  . Not on file.   Social History Main Topics  . Smoking status: Never Smoker  . Smokeless tobacco: Never Used  . Alcohol use No  . Drug use: No  . Sexual activity: Not on file   Other Topics Concern  . Not on file   Social History Narrative  . No narrative on file    Past Surgical History:  Procedure Laterality Date  . BILATERAL SALPINGOOPHORECTOMY  2001  . BREAST LUMPECTOMY WITH RADIOACTIVE SEED LOCALIZATION Right 09/12/2015   Procedure: RIGHT BREAST LUMPECTOMY WITH RADIOACTIVE SEED LOCALIZATION;  Surgeon: Erroll Luna, MD;  Location: [place];  Service: General;  Laterality: Right;  . COLONOSCOPY   [id number]  . LEFT HEART CATHETERIZATION WITH CORONARY ANGIOGRAM N/A 06/26/2014   Procedure: LEFT HEART CATHETERIZATION WITH CORONARY ANGIOGRAM;  Surgeon: Leonie Man, MD;  Location: [facility] CATH LAB;  Service: Cardiovascular;  Laterality: N/A;  . TONSILLECTOMY  1957  . TOTAL ABDOMINAL HYSTERECTOMY  2001    Family History  Problem Relation Age of Onset  . Pneumonia Mother   . Asthma    . Allergies    . Colon cancer Neg Hx   . Stomach cancer Neg Hx     Allergies  Allergen Reactions  . Penicillins     REACTION: rash  . Tetracycline     REACTION: rash    Current Outpatient Prescriptions on File Prior to Visit  Medication Sig Dispense Refill  . aspirin 81 MG EC tablet Take 81 mg by mouth daily.      Marland Kitchen atenolol (TENORMIN) 50 MG tablet Take 1 tablet (50 mg total) by mouth 2 (two) times daily. (Patient taking differently: Take 50 mg by mouth daily. ) 180 tablet 1  . Calcium Citrate-Vitamin D (CITRACAL PETITES/VITAMIN D PO) Take 1 tablet by mouth daily.      . Cholecalciferol (VITAMIN D3) 2000 UNITS TABS Take 1 tablet by mouth daily.      . Cyanocobalamin (VITAMIN B-12 PO) Take 1 tablet by mouth daily.    . hydrochlorothiazide (HYDRODIURIL) 12.5 MG tablet TAKE ONE TABLET BY MOUTH ONCE DAILY. 90 tablet 1  . KLOR-CON M10 10 MEQ tablet TAKE TWO TABLETS BY  MOUTH ONCE DAILY 180 tablet 3  . pravastatin (PRAVACHOL) 40 MG tablet TAKE ONE TABLET BY MOUTH AT BEDTIME 90 tablet 3  . [DISCONTINUED] potassium chloride (K-DUR) 10 MEQ tablet Take 2 capsules daily 180 tablet 3   No current facility-administered medications on file prior to visit.     BP 122/70 (BP Location: Left Arm, Patient Position: Sitting, Cuff Size: Normal)   Pulse 69   Temp 97.8 F (36.6 C) (Oral)   Ht 5' (1.524 m)   Wt 133 lb 9.6 oz (60.6 kg)   SpO2 96%   BMI 26.09 kg/m   Medicare wellness visit  1. Risk factors, based on past  M,S,F history.  Patient has a history of nonobstructive coronary artery disease.  Cardiac risk  factors include hypertension and dyslipidemia  2.  Physical activities:does exercise fairly regularly, uses a elliptical machine 3 times weekly  3.  Depression/mood:no history of major depression  4.  Hearing:no deficits  5.  ADL's:independent  6.  Fall risk:low  7.  Home safety:no problems identified 8.  Height weight, and visual acuity; height and weight stable no change in visual acuity.  Does have yearly eye examinations  9.  Counseling:continue heart healthy diet and regular exercise  10. Lab orders based on risk factors: laboratory profile reviewed; cholesterol profile not quite as favorable  11. Referral :needs mammogram next month  12. Care plan:continue efforts at aggressive risk factor modification  13. Cognitive assessment: alert and oriented with normal affect no cognitive dysfunction  14. Screening: Patient provided with a written and personalized 5-10 year screening schedule in the AVS.    15. Provider List Update: primary care cardiology, GI OB/GYN.  General surgery ophthalmology    Review of Systems  Constitutional: Negative.   HENT: Negative for congestion, dental problem, hearing loss, rhinorrhea, sinus pressure, sore throat and tinnitus.   Eyes: Negative for pain, discharge and visual disturbance.  Respiratory: Negative for cough and shortness of breath.   Cardiovascular: Negative for chest pain, palpitations and leg swelling.  Gastrointestinal: Negative for abdominal distention, abdominal pain, blood in stool, constipation, diarrhea, nausea and vomiting.  Genitourinary: Negative for difficulty urinating, dysuria, flank pain, frequency, hematuria, pelvic pain, urgency, vaginal bleeding, vaginal discharge and vaginal pain.  Musculoskeletal: Negative for arthralgias, gait problem and joint swelling.  Skin: Negative for rash.  Neurological: Negative for dizziness, syncope, speech difficulty, weakness, numbness and headaches.  Hematological: Negative for  adenopathy.  Psychiatric/Behavioral: Negative for agitation, behavioral problems and dysphoric mood. The patient is not nervous/anxious.        Objective:   Physical Exam  Constitutional: She is oriented to person, place, and time. She appears well-developed and well-nourished.  Blood pressure low normal  HENT:  Head: Normocephalic and atraumatic.  Right Ear: External ear normal.  Left Ear: External ear normal.  Mouth/Throat: Oropharynx is clear and moist.  Eyes: Conjunctivae and EOM are normal.  Neck: Normal range of motion. Neck supple. No JVD present. No thyromegaly present.  Cardiovascular: Normal rate, regular rhythm, normal heart sounds and intact distal pulses.   No murmur heard. Dorsalis pedis pulses faint.  Posterior tibial pulses full  Pulmonary/Chest: Effort normal and breath sounds normal. She has no wheezes. She has no rales.  Abdominal: Soft. Bowel sounds are normal. She exhibits no distension and no mass. There is no tenderness. There is no rebound and no guarding.  Musculoskeletal: Normal range of motion. She exhibits no edema or tenderness.  Neurological: She is alert and oriented to  person, place, and time. She has normal reflexes. No cranial nerve deficit. She exhibits normal muscle tone. Coordination normal.  Skin: Skin is warm and dry. No rash noted.  Psychiatric: She has a normal mood and affect. Her behavior is normal.          Assessment & Plan:   Preventive health examination Medicare wellness visit Nonobstructive mild to moderate coronary artery disease.  Will continue efforts at aggressive risk factor modification Essential hypertension, well-controlled Dyslipidemia.  Continue statin therapy  No change in medical regimen  Follow-up 6-12 months  Shams Fill Pilar Plate

## 2016-07-27 NOTE — Progress Notes (Signed)
Pre visit review using our clinic review tool, if applicable. No additional management support is needed unless otherwise documented below in the visit note. 

## 2016-07-27 NOTE — Patient Instructions (Addendum)
Limit your sodium (Salt) intake    It is important that you exercise regularly, at least 20 minutes 3 to 4 times per week.  If you develop chest pain or shortness of breath seek  medical attention.  Take a calcium supplement, plus [id number] units of vitamin D  Return in one year for follow-up  Please check your blood pressure on a regular basis.  If it is consistently greater than 150/90, please make an office appointment.   Health Maintenance for Postmenopausal Women Menopause is a normal process in which your reproductive ability comes to an end. This process happens gradually over a span of months to years, usually between the ages of 72 and 61. Menopause is complete when you have missed 12 consecutive menstrual periods. It is important to talk with your health care provider about some of the most common conditions that affect postmenopausal women, such as heart disease, cancer, and bone loss (osteoporosis). Adopting a healthy lifestyle and getting preventive care can help to promote your health and wellness. Those actions can also lower your chances of developing some of these common conditions. What should I know about menopause? During menopause, you may experience a number of symptoms, such as:  Moderate-to-severe hot flashes.  Night sweats.  Decrease in sex drive.  Mood swings.  Headaches.  Tiredness.  Irritability.  Memory problems.  Insomnia. Choosing to treat or not to treat menopausal changes is an individual decision that you make with your health care provider. What should I know about hormone replacement therapy and supplements? Hormone therapy products are effective for treating symptoms that are associated with menopause, such as hot flashes and night sweats. Hormone replacement carries certain risks, especially as you become older. If you are thinking about using estrogen or estrogen with progestin treatments, discuss the benefits and risks with your health care  provider. What should I know about heart disease and stroke? Heart disease, heart attack, and stroke become more likely as you age. This may be due, in part, to the hormonal changes that your body experiences during menopause. These can affect how your body processes dietary fats, triglycerides, and cholesterol. Heart attack and stroke are both medical emergencies. There are many things that you can do to help prevent heart disease and stroke:  Have your blood pressure checked at least every 1-2 years. High blood pressure causes heart disease and increases the risk of stroke.  If you are 26-67 years old, ask your health care provider if you should take aspirin to prevent a heart attack or a stroke.  Do not use any tobacco products, including cigarettes, chewing tobacco, or electronic cigarettes. If you need help quitting, ask your health care provider.  It is important to eat a healthy diet and maintain a healthy weight.  Be sure to include plenty of vegetables, fruits, low-fat dairy products, and lean protein.  Avoid eating foods that are high in solid fats, added sugars, or salt (sodium).  Get regular exercise. This is one of the most important things that you can do for your health.  Try to exercise for at least 150 minutes each week. The type of exercise that you do should increase your heart rate and make you sweat. This is known as moderate-intensity exercise.  Try to do strengthening exercises at least twice each week. Do these in addition to the moderate-intensity exercise.  Know your numbers.Ask your health care provider to check your cholesterol and your blood glucose. Continue to have your blood tested as  directed by your health care provider. What should I know about cancer screening? There are several types of cancer. Take the following steps to reduce your risk and to catch any cancer development as early as possible. Breast Cancer  Practice breast self-awareness.  This  means understanding how your breasts normally appear and feel.  It also means doing regular breast self-exams. Let your health care provider know about any changes, no matter how small.  If you are 49 or older, have a clinician do a breast exam (clinical breast exam or CBE) every year. Depending on your age, family history, and medical history, it may be recommended that you also have a yearly breast X-ray (mammogram).  If you have a family history of breast cancer, talk with your health care provider about genetic screening.  If you are at high risk for breast cancer, talk with your health care provider about having an MRI and a mammogram every year.  Breast cancer (BRCA) gene test is recommended for women who have family members with BRCA-related cancers. Results of the assessment will determine the need for genetic counseling and BRCA1 and for BRCA2 testing. BRCA-related cancers include these types:  Breast. This occurs in males or females.  Ovarian.  Tubal. This may also be called fallopian tube cancer.  Cancer of the abdominal or pelvic lining (peritoneal cancer).  Prostate.  Pancreatic. Cervical, Uterine, and Ovarian Cancer  Your health care provider may recommend that you be screened regularly for cancer of the pelvic organs. These include your ovaries, uterus, and vagina. This screening involves a pelvic exam, which includes checking for microscopic changes to the surface of your cervix (Pap test).  For women ages 21-65, health care providers may recommend a pelvic exam and a Pap test every three years. For women ages 28-65, they may recommend the Pap test and pelvic exam, combined with testing for human papilloma virus (HPV), every five years. Some types of HPV increase your risk of cervical cancer. Testing for HPV may also be done on women of any age who have unclear Pap test results.  Other health care providers may not recommend any screening for nonpregnant women who are  considered low risk for pelvic cancer and have no symptoms. Ask your health care provider if a screening pelvic exam is right for you.  If you have had past treatment for cervical cancer or a condition that could lead to cancer, you need Pap tests and screening for cancer for at least 20 years after your treatment. If Pap tests have been discontinued for you, your risk factors (such as having a new sexual partner) need to be reassessed to determine if you should start having screenings again. Some women have medical problems that increase the chance of getting cervical cancer. In these cases, your health care provider may recommend that you have screening and Pap tests more often.  If you have a family history of uterine cancer or ovarian cancer, talk with your health care provider about genetic screening.  If you have vaginal bleeding after reaching menopause, tell your health care provider.  There are currently no reliable tests available to screen for ovarian cancer. Lung Cancer  Lung cancer screening is recommended for adults 15-96 years old who are at high risk for lung cancer because of a history of smoking. A yearly low-dose CT scan of the lungs is recommended if you:  Currently smoke.  Have a history of at least 30 pack-years of smoking and you currently smoke  or have quit within the past 15 years. A pack-year is smoking an average of one pack of cigarettes per day for one year. Yearly screening should:  Continue until it has been 15 years since you quit.  Stop if you develop a health problem that would prevent you from having lung cancer treatment. Colorectal Cancer  This type of cancer can be detected and can often be prevented.  Routine colorectal cancer screening usually begins at age 82 and continues through age 96.  If you have risk factors for colon cancer, your health care provider may recommend that you be screened at an earlier age.  If you have a family history of  colorectal cancer, talk with your health care provider about genetic screening.  Your health care provider may also recommend using home test kits to check for hidden blood in your stool.  A small camera at the end of a tube can be used to examine your colon directly (sigmoidoscopy or colonoscopy). This is done to check for the earliest forms of colorectal cancer.  Direct examination of the colon should be repeated every 5-10 years until age 79. However, if early forms of precancerous polyps or small growths are found or if you have a family history or genetic risk for colorectal cancer, you may need to be screened more often. Skin Cancer  Check your skin from head to toe regularly.  Monitor any moles. Be sure to tell your health care provider:  About any new moles or changes in moles, especially if there is a change in a mole's shape or color.  If you have a mole that is larger than the size of a pencil eraser.  If any of your family members has a history of skin cancer, especially at a young age, talk with your health care provider about genetic screening.  Always use sunscreen. Apply sunscreen liberally and repeatedly throughout the day.  Whenever you are outside, protect yourself by wearing long sleeves, pants, a wide-brimmed hat, and sunglasses. What should I know about osteoporosis? Osteoporosis is a condition in which bone destruction happens more quickly than new bone creation. After menopause, you may be at an increased risk for osteoporosis. To help prevent osteoporosis or the bone fractures that can happen because of osteoporosis, the following is recommended:  If you are 84-25 years old, get at least 1,000 mg of calcium and at least 600 mg of vitamin D per day.  If you are older than age 42 but younger than age 68, get at least 1,200 mg of calcium and at least 600 mg of vitamin D per day.  If you are older than age 27, get at least 1,200 mg of calcium and at least 800 mg of  vitamin D per day. Smoking and excessive alcohol intake increase the risk of osteoporosis. Eat foods that are rich in calcium and vitamin D, and do weight-bearing exercises several times each week as directed by your health care provider. What should I know about how menopause affects my mental health? Depression may occur at any age, but it is more common as you become older. Common symptoms of depression include:  Low or sad mood.  Changes in sleep patterns.  Changes in appetite or eating patterns.  Feeling an overall lack of motivation or enjoyment of activities that you previously enjoyed.  Frequent crying spells. Talk with your health care provider if you think that you are experiencing depression. What should I know about immunizations? It is important  that you get and maintain your immunizations. These include:  Tetanus, diphtheria, and pertussis (Tdap) booster vaccine.  Influenza every year before the flu season begins.  Pneumonia vaccine.  Shingles vaccine. Your health care provider may also recommend other immunizations. This information is not intended to replace advice given to you by your health care provider. Make sure you discuss any questions you have with your health care provider. Document Released: 07/03/2005 Document Revised: 11/29/2015 Document Reviewed: 02/12/2015 Elsevier Interactive Patient Education  2017 [employer].

## 2016-09-17 ENCOUNTER — Other Ambulatory Visit: Payer: Self-pay | Admitting: Internal Medicine

## 2016-09-17 IMAGING — MG MM SCREENING BREAST TOMO BILATERAL
8 of 12 series · 8 of 28 positions shown · non-contrast
Comparison: Previous exam(s).

CLINICAL DATA: Screening.

EXAM:
DIGITAL SCREENING BILATERAL MAMMOGRAM WITH 3D TOMO WITH CAD

[L CC]
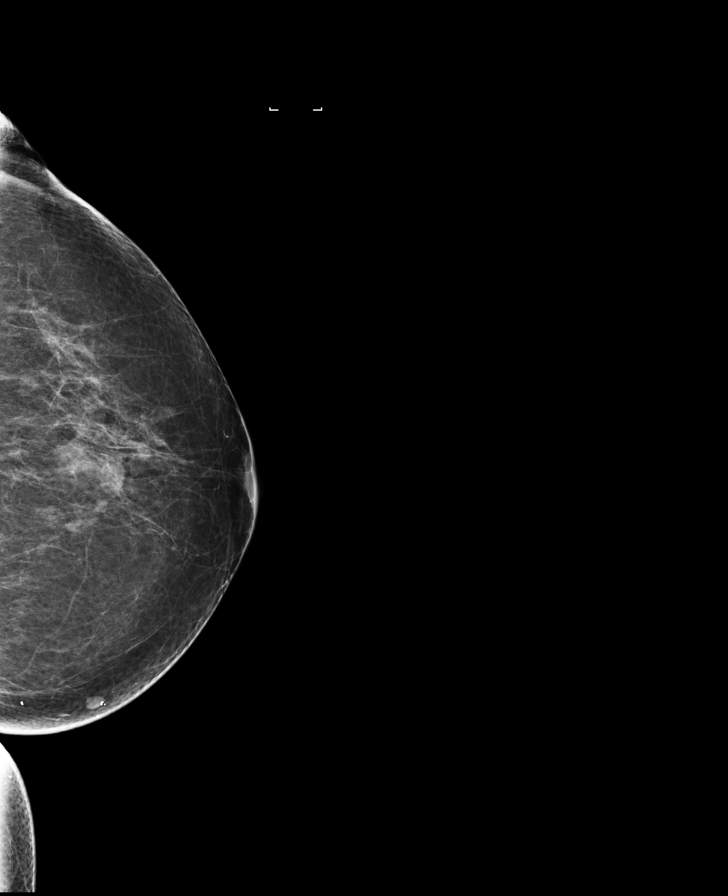

[L MLO synth-2D]
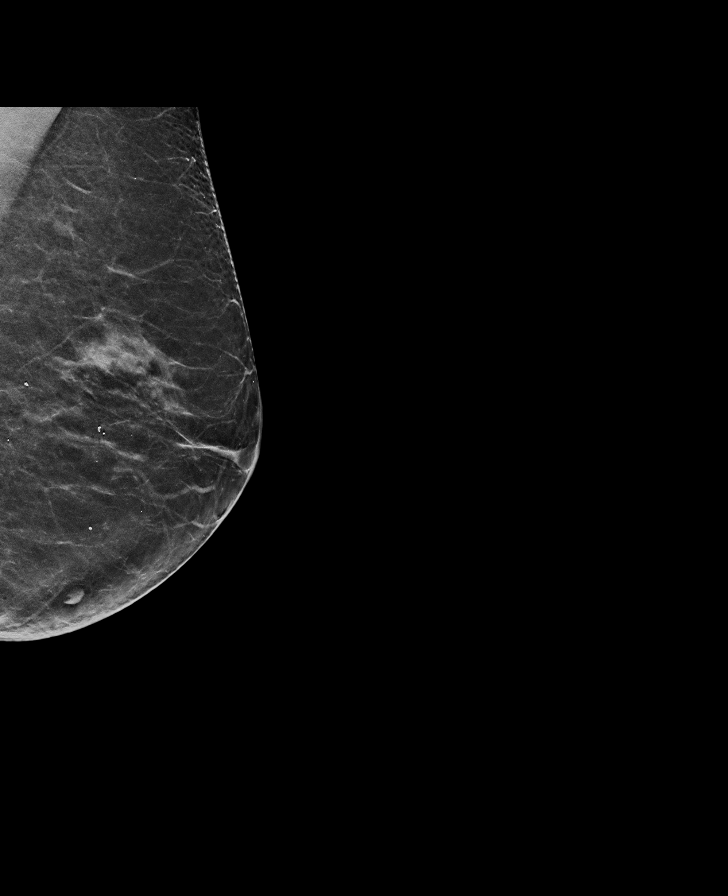

[R MLO synth-2D]
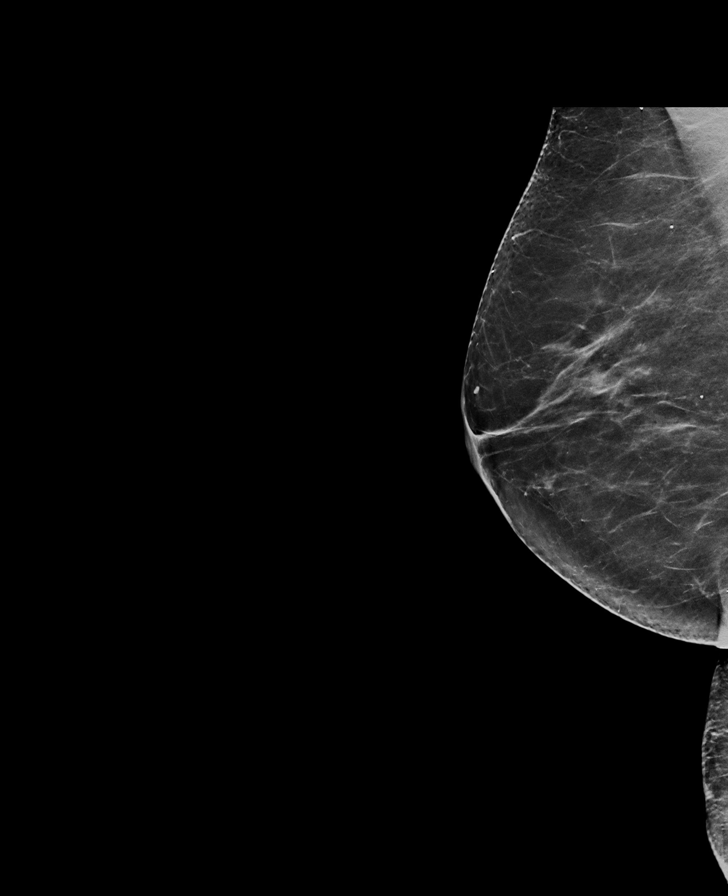

[R CC]
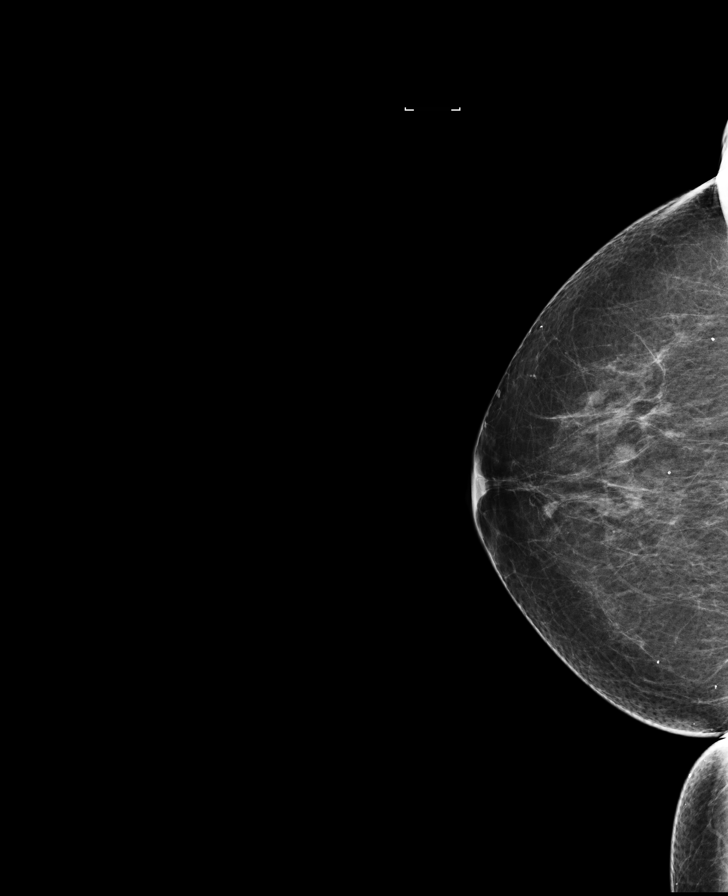

[L MLO]
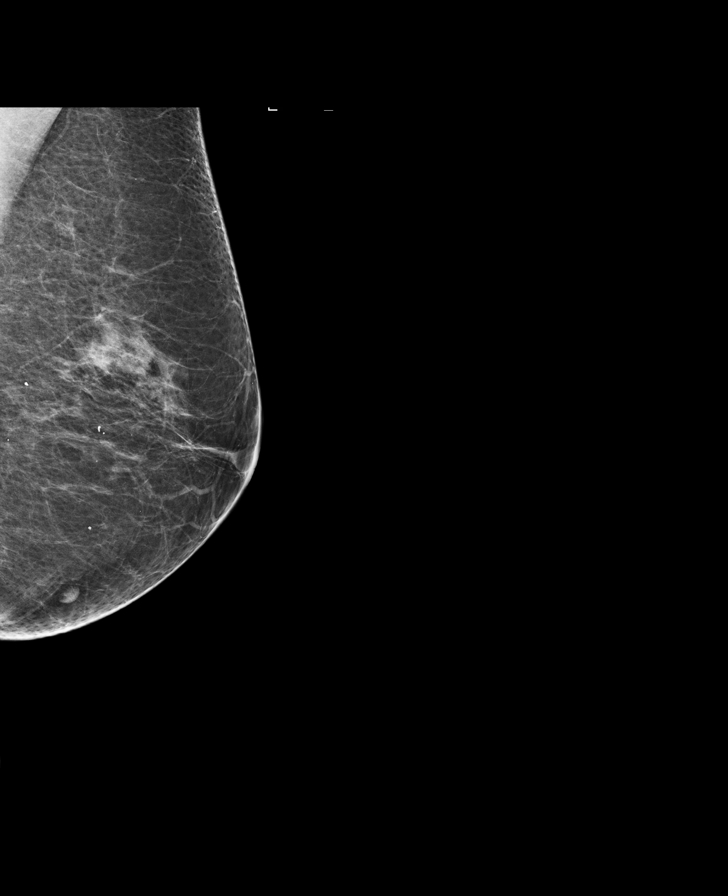

[R CC synth-2D]
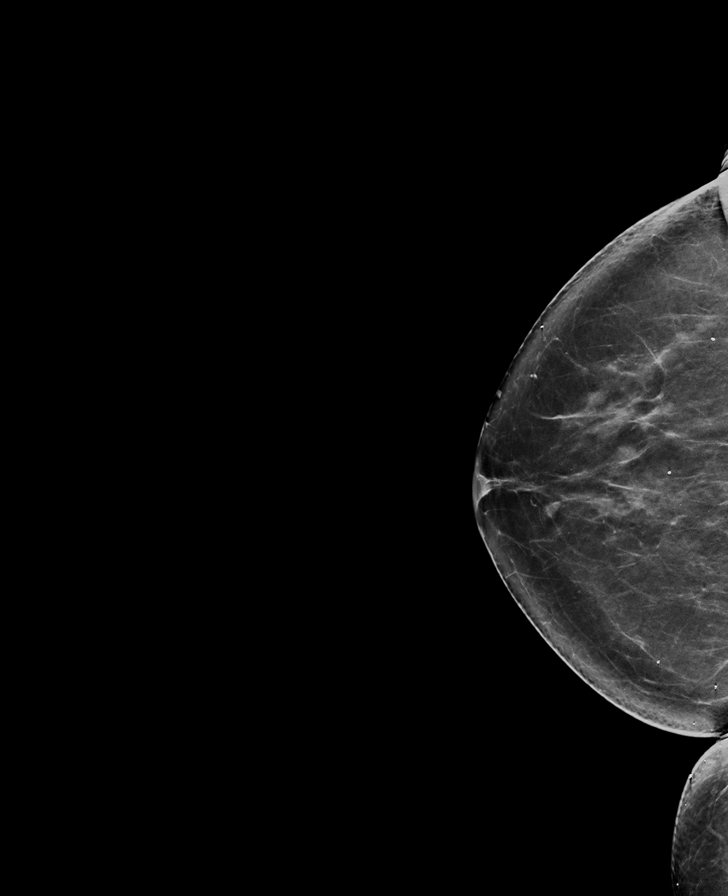

[L CC synth-2D]
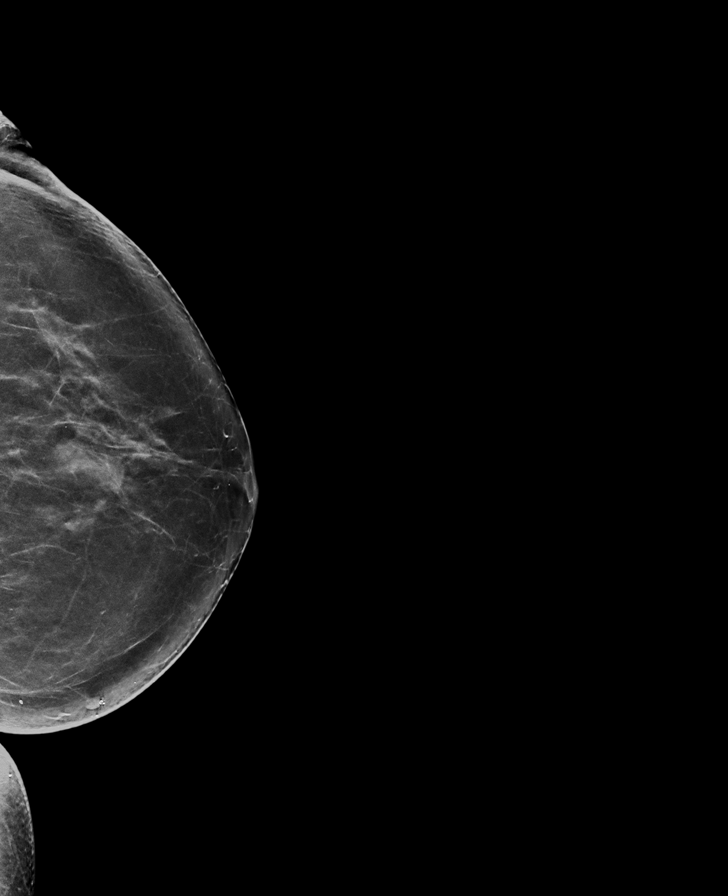

[R MLO]
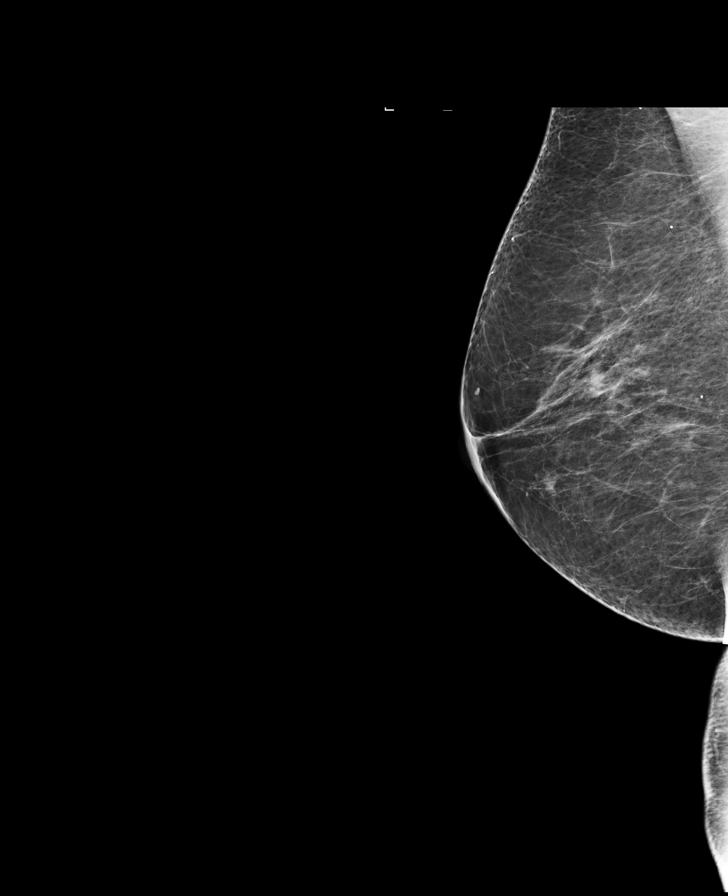

[8 of 28 positions shown; findings below may reference images not displayed]

ACR Breast Density Category b: There are scattered areas of
fibroglandular density.
FINDINGS: In the right breast, a possible mass warrants further evaluation. In
the left breast, no findings suspicious for malignancy. Images were
processed with CAD.
IMPRESSION: Further evaluation is suggested for possible mass in the right
breast.

RECOMMENDATION:
Diagnostic mammogram and possibly ultrasound of the right breast.
(Code:[id number])

The patient will be contacted regarding the findings, and additional
imaging will be scheduled.

BI-RADS CATEGORY  0: Incomplete. Need additional imaging evaluation
and/or prior mammograms for comparison.

## 2016-10-08 ENCOUNTER — Other Ambulatory Visit: Payer: Self-pay | Admitting: Internal Medicine

## 2016-10-08 DIAGNOSIS — Z1231 Encounter for screening mammogram for malignant neoplasm of breast: Secondary | ICD-10-CM

## 2016-10-28 ENCOUNTER — Ambulatory Visit
Admission: RE | Admit: 2016-10-28 | Discharge: 2016-10-28 | Disposition: A | Discharge status: Home / Self Care | Payer: PPO | Source: Ambulatory Visit | Attending: Internal Medicine | Admitting: Internal Medicine

## 2016-10-28 DIAGNOSIS — Z1231 Encounter for screening mammogram for malignant neoplasm of breast: Secondary | ICD-10-CM

## 2016-12-02 DIAGNOSIS — N951 Menopausal and female climacteric states: Secondary | ICD-10-CM | POA: Diagnosis not present

## 2016-12-02 DIAGNOSIS — Z79890 Hormone replacement therapy (postmenopausal): Secondary | ICD-10-CM | POA: Diagnosis not present

## 2016-12-22 DIAGNOSIS — S46912A Strain of unspecified muscle, fascia and tendon at shoulder and upper arm level, left arm, initial encounter: Secondary | ICD-10-CM | POA: Diagnosis not present

## 2016-12-22 DIAGNOSIS — M25512 Pain in left shoulder: Secondary | ICD-10-CM | POA: Diagnosis not present

## 2016-12-23 DIAGNOSIS — H5203 Hypermetropia, bilateral: Secondary | ICD-10-CM | POA: Diagnosis not present

## 2016-12-23 DIAGNOSIS — H25813 Combined forms of age-related cataract, bilateral: Secondary | ICD-10-CM | POA: Diagnosis not present

## 2016-12-23 DIAGNOSIS — H52223 Regular astigmatism, bilateral: Secondary | ICD-10-CM | POA: Diagnosis not present

## 2016-12-30 DIAGNOSIS — D229 Melanocytic nevi, unspecified: Secondary | ICD-10-CM | POA: Diagnosis not present

## 2016-12-30 DIAGNOSIS — L821 Other seborrheic keratosis: Secondary | ICD-10-CM | POA: Diagnosis not present

## 2017-01-14 DIAGNOSIS — M25512 Pain in left shoulder: Secondary | ICD-10-CM | POA: Diagnosis not present

## 2017-02-11 ENCOUNTER — Encounter: Payer: Self-pay | Admitting: Internal Medicine

## 2017-03-12 ENCOUNTER — Ambulatory Visit (INDEPENDENT_AMBULATORY_CARE_PROVIDER_SITE_OTHER): Payer: PPO

## 2017-03-12 DIAGNOSIS — Z23 Encounter for immunization: Secondary | ICD-10-CM

## 2017-03-20 ENCOUNTER — Other Ambulatory Visit: Payer: Self-pay | Admitting: Internal Medicine

## 2017-03-24 DIAGNOSIS — S61051A Open bite of right thumb without damage to nail, initial encounter: Secondary | ICD-10-CM | POA: Diagnosis not present

## 2017-03-24 DIAGNOSIS — W5501XA Bitten by cat, initial encounter: Secondary | ICD-10-CM | POA: Diagnosis not present

## 2017-05-19 ENCOUNTER — Other Ambulatory Visit: Payer: Self-pay | Admitting: Internal Medicine

## 2017-06-13 ENCOUNTER — Encounter (HOSPITAL_COMMUNITY): Payer: Self-pay | Admitting: *Deleted

## 2017-06-13 ENCOUNTER — Ambulatory Visit (HOSPITAL_COMMUNITY)
Admission: EM | Admit: 2017-06-13 | Discharge: 2017-06-13 | Disposition: A | Discharge status: Home / Self Care | Payer: PPO | Attending: Urgent Care | Admitting: Urgent Care

## 2017-06-13 ENCOUNTER — Other Ambulatory Visit: Payer: Self-pay

## 2017-06-13 DIAGNOSIS — R42 Dizziness and giddiness: Secondary | ICD-10-CM | POA: Diagnosis not present

## 2017-06-13 DIAGNOSIS — R112 Nausea with vomiting, unspecified: Secondary | ICD-10-CM | POA: Diagnosis not present

## 2017-06-13 DIAGNOSIS — R61 Generalized hyperhidrosis: Secondary | ICD-10-CM | POA: Diagnosis not present

## 2017-06-13 DIAGNOSIS — L749 Eccrine sweat disorder, unspecified: Secondary | ICD-10-CM

## 2017-06-13 DIAGNOSIS — I491 Atrial premature depolarization: Secondary | ICD-10-CM | POA: Diagnosis not present

## 2017-06-13 DIAGNOSIS — R110 Nausea: Secondary | ICD-10-CM | POA: Insufficient documentation

## 2017-06-13 DIAGNOSIS — Z87448 Personal history of other diseases of urinary system: Secondary | ICD-10-CM | POA: Diagnosis not present

## 2017-06-13 LAB — POCT URINALYSIS DIP (DEVICE)
BILIRUBIN URINE: NEGATIVE
GLUCOSE, UA: NEGATIVE mg/dL
KETONES UR: NEGATIVE mg/dL
Leukocytes, UA: NEGATIVE
Nitrite: NEGATIVE
PROTEIN: NEGATIVE mg/dL
SPECIFIC GRAVITY, URINE: 1.015 (ref 1.005–1.030)
Urobilinogen, UA: 0.2 mg/dL (ref 0.0–1.0)
pH: 6.5 (ref 5.0–8.0)

## 2017-06-13 LAB — POCT I-STAT, CHEM 8
BUN: 13 mg/dL (ref 6–20)
CHLORIDE: 99 mmol/L — AB (ref 101–111)
CREATININE: 0.6 mg/dL (ref 0.44–1.00)
Calcium, Ion: 1.17 mmol/L (ref 1.15–1.40)
GLUCOSE: 132 mg/dL — AB (ref 65–99)
HEMATOCRIT: 45 % (ref 36.0–46.0)
Hemoglobin: 15.3 g/dL — ABNORMAL HIGH (ref 12.0–15.0)
POTASSIUM: 3.8 mmol/L (ref 3.5–5.1)
Sodium: 137 mmol/L (ref 135–145)
TCO2: 29 mmol/L (ref 22–32)

## 2017-06-13 MED ORDER — MECLIZINE HCL 25 MG PO TABS: 25.0000 mg | ORAL_TABLET | Freq: Two times a day (BID) | ORAL | 0 refills | Status: DC | PRN

## 2017-06-13 NOTE — ED Provider Notes (Addendum)
MRN: [id number] DOB: 22-Oct-1940  Subjective:   Katelyn [name] is a 77 y.o. female presenting for 3 day history of dizziness, nausea without vomiting, 2 sweating episodes. Dizziness has been constant today, initially started every morning. Denies headache, confusion, speech disturbance, chest pain, heart racing, palpitations, abdominal pain, dysuria, rashes. Admits that she has had hematuria in the past. Patient's daughter is concerned for carbon monoxide poisoning, states that patient's husband was also experiencing similar episodes, was taken off his BP medications. Patient does have a cardiologist, has not followed up with them in more than a year. She had a normal catheterization last year.    Katelyn [name] is allergic to penicillins and tetracycline.  Katelyn [name]  has a past medical history of Anxiety state, unspecified, Disorder of bone and cartilage, unspecified, Osteoarthrosis, unspecified whether generalized or localized, unspecified site, Other and unspecified hyperlipidemia, Palpitations, Shortness of breath, and Unspecified essential hypertension. Also  has a past surgical history that includes Total abdominal hysterectomy (2001); Bilateral salpingoophorectomy (2001); Tonsillectomy (1957); Colonoscopy (9169,4503); left heart catheterization with coronary angiogram (N/A, 06/26/2014); Breast lumpectomy with radioactive seed localization (Right, 09/12/2015); Breast excisional biopsy (Right, 09/12/2015); and Breast cyst aspiration (2001).  Objective:   Vitals: BP (!) 127/55 (BP Location: Left Arm)   Pulse 66   Temp (!) 97.5 F (36.4 C) (Oral)   SpO2 100%   Orthostatic VS for the past 24 hrs:  BP- Lying Pulse- Lying BP- Sitting Pulse- Sitting BP- Standing at 0 minutes Pulse- Standing at 0 minutes  06/13/17 1808 134/64 74 139/74 72 129/75 79   Physical Exam  Constitutional: She is oriented to person, place, and time. She appears well-developed and well-nourished.  HENT:  Mouth/Throat: Oropharynx  is clear and moist.  Eyes: EOM are normal. Pupils are equal, round, and reactive to light.  Neck: Normal range of motion. Neck supple.  Cardiovascular: Normal rate, regular rhythm and intact distal pulses. Exam reveals no gallop and no friction rub.  No murmur heard. Pulmonary/Chest: No respiratory distress. She has no wheezes. She has no rales.  Abdominal: Soft. Bowel sounds are normal. She exhibits no distension and no mass. There is no tenderness. There is no guarding.  Musculoskeletal: She exhibits no edema.  Lymphadenopathy:    She has no cervical adenopathy.  Neurological: She is alert and oriented to person, place, and time.  Skin: Skin is warm and dry.  Psychiatric: She has a normal mood and affect.   Results for orders placed or performed during the hospital encounter of 06/13/17 (from the past 24 hour(s))  I-STAT, chem 8     Status: Abnormal   Collection Time: 06/13/17  6:08 PM  Result Value Ref Range   Sodium 137 135 - 145 mmol/L   Potassium 3.8 3.5 - 5.1 mmol/L   Chloride 99 (L) 101 - 111 mmol/L   BUN 13 6 - 20 mg/dL   Creatinine, Ser 0.60 0.44 - 1.00 mg/dL   Glucose, Bld 132 (H) 65 - 99 mg/dL   Calcium, Ion 1.17 1.15 - 1.40 mmol/L   TCO2 29 22 - 32 mmol/L   Hemoglobin 15.3 (H) 12.0 - 15.0 g/dL   HCT 45.0 36.0 - 46.0 %  POCT urinalysis dip (device)     Status: Abnormal   Collection Time: 06/13/17  6:15 PM  Result Value Ref Range   Glucose, UA NEGATIVE NEGATIVE mg/dL   Bilirubin Urine NEGATIVE NEGATIVE   Ketones, ur NEGATIVE NEGATIVE mg/dL   Specific Gravity, Urine 1.015 1.005 - 1.030  Hgb urine dipstick MODERATE (A) NEGATIVE   pH 6.5 5.0 - 8.0   Protein, ur NEGATIVE NEGATIVE mg/dL   Urobilinogen, UA 0.2 0.0 - 1.0 mg/dL   Nitrite NEGATIVE NEGATIVE   Leukocytes, UA NEGATIVE NEGATIVE   ED ECG REPORT   Date: 06/13/2017  Rate: 72bpm  Rhythm: premature atrial contractions (PAC)  QRS Axis: normal  Intervals: normal  ST/T Wave abnormalities: t-wave flattening  throughout  Conduction Disutrbances:none  Narrative Interpretation: No acute findings, sinus rhythm at 72bpm.  Old EKG Reviewed: comparable to ecg from 2016 but with new pac's  I have personally reviewed the EKG tracing and agree with the computerized printout as noted.   Assessment and Plan :   Dizziness  Nausea  Sweating abnormality  Premature atrial complexes  Will manage symptomatically. Patient is stable as there are no signs of UTI, dehydration, anemia, acute findings on ecg, electrolyte disturbance. Patient will try meclizine for symptomatic relief. Counseled patient on potential for adverse effects with medications prescribed today, patient verbalized understanding. Urine culture pending. ER and return-to-clinic precautions discussed, patient verbalized understanding. Otherwise, follow up with cardiologist, PCP.   Jaynee Eagles, [place] 06/13/17 [id number]

## 2017-06-13 NOTE — ED Notes (Signed)
Katelyn [name] given EKG

## 2017-06-13 NOTE — ED Triage Notes (Signed)
Per pt she's been really dizzy and nauseous, sweating

## 2017-06-15 LAB — URINE CULTURE

## 2017-07-07 ENCOUNTER — Other Ambulatory Visit: Payer: Self-pay | Admitting: Internal Medicine

## 2017-07-23 ENCOUNTER — Other Ambulatory Visit: Payer: PPO

## 2017-07-30 ENCOUNTER — Encounter: Payer: PPO | Admitting: Internal Medicine

## 2017-08-11 ENCOUNTER — Ambulatory Visit (INDEPENDENT_AMBULATORY_CARE_PROVIDER_SITE_OTHER): Payer: PPO | Admitting: Internal Medicine

## 2017-08-11 ENCOUNTER — Encounter: Payer: Self-pay | Admitting: Internal Medicine

## 2017-08-11 VITALS — BP 122/62 | HR 71 | Temp 98.2°F | Ht 60.0 in | Wt 127.0 lb

## 2017-08-11 DIAGNOSIS — E785 Hyperlipidemia, unspecified: Secondary | ICD-10-CM | POA: Diagnosis not present

## 2017-08-11 DIAGNOSIS — I2510 Atherosclerotic heart disease of native coronary artery without angina pectoris: Secondary | ICD-10-CM | POA: Diagnosis not present

## 2017-08-11 DIAGNOSIS — Z0000 Encounter for general adult medical examination without abnormal findings: Secondary | ICD-10-CM

## 2017-08-11 DIAGNOSIS — I10 Essential (primary) hypertension: Secondary | ICD-10-CM

## 2017-08-11 LAB — COMPREHENSIVE METABOLIC PANEL
ALBUMIN: 4.9 g/dL (ref 3.5–5.2)
ALK PHOS: 45 U/L (ref 39–117)
ALT: 18 U/L (ref 0–35)
AST: 20 U/L (ref 0–37)
BUN: 17 mg/dL (ref 6–23)
CO2: 28 mEq/L (ref 19–32)
Calcium: 10.1 mg/dL (ref 8.4–10.5)
Chloride: 104 mEq/L (ref 96–112)
Creatinine, Ser: 0.78 mg/dL (ref 0.40–1.20)
GFR: 76.4 mL/min (ref >60.00)
Glucose, Bld: 103 mg/dL — ABNORMAL HIGH (ref 70–99)
POTASSIUM: 4.1 meq/L (ref 3.5–5.1)
Sodium: 143 mEq/L (ref 135–145)
TOTAL PROTEIN: 7.2 g/dL (ref 6.0–8.3)
Total Bilirubin: 0.7 mg/dL (ref 0.2–1.2)

## 2017-08-11 LAB — CBC WITH DIFFERENTIAL/PLATELET
BASOS ABS: 0 10*3/uL (ref 0.0–0.1)
Basophils Relative: 0.6 % (ref 0.0–3.0)
EOS PCT: 2.3 % (ref 0.0–5.0)
Eosinophils Absolute: 0.1 10*3/uL (ref 0.0–0.7)
HCT: 42.9 % (ref 36.0–46.0)
HEMOGLOBIN: 14.9 g/dL (ref 12.0–15.0)
Lymphocytes Relative: 31 % (ref 12.0–46.0)
Lymphs Abs: 1.9 10*3/uL (ref 0.7–4.0)
MCHC: 34.8 g/dL (ref 30.0–36.0)
MCV: 91.1 fl (ref 78.0–100.0)
MONO ABS: 0.5 10*3/uL (ref 0.1–1.0)
MONOS PCT: 7.9 % (ref 3.0–12.0)
Neutro Abs: 3.7 10*3/uL (ref 1.4–7.7)
Neutrophils Relative %: 58.2 % (ref 43.0–77.0)
Platelets: 278 10*3/uL (ref 150.0–400.0)
RBC: 4.71 Mil/uL (ref 3.87–5.11)
RDW: 12.6 % (ref 11.5–15.5)
WBC: 6.3 10*3/uL (ref 4.0–10.5)

## 2017-08-11 LAB — LIPID PANEL
CHOLESTEROL: 191 mg/dL (ref 0–200)
HDL: 53.5 mg/dL (ref >39.00)
LDL Cholesterol: 111 mg/dL — ABNORMAL HIGH (ref 0–99)
NonHDL: 137.44
TRIGLYCERIDES: 131 mg/dL (ref 0.0–149.0)
Total CHOL/HDL Ratio: 4
VLDL: 26.2 mg/dL (ref 0.0–40.0)

## 2017-08-11 LAB — TSH: TSH: 0.97 u[IU]/mL (ref 0.35–4.50)

## 2017-08-11 MED ORDER — ATENOLOL 50 MG PO TABS: 25.0000 mg | ORAL_TABLET | Freq: Every day | ORAL | 1 refills | Status: DC

## 2017-08-11 NOTE — Progress Notes (Signed)
Subjective:    Patient ID: Katelyn [name], female    DOB: 07-Feb-1941, 77 y.o.   MRN: [id number]  HPI  77-year-old patient who is seen today for an annual preventive health examination as well as a Medicare wellness visit  She does quite well. She has a history of essential hypertension as well as mild dyslipidemia.  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 Last year, she underwent breast biopsy for benign disease. Doing quite well today without concerns or complaints.  She was seen in the ED 2 months ago for dizziness  She has a history of microscopic hematuria and was evaluated by urology approximately 2 years ago Last colonoscopy March 2014 Heart catheterization, February 2016 2-D echocardiogram, February 2016 Last mammogram April 2017  Patient has a history of suspected IBS and has seen GI in the past.  The past 6 weeks she has had a flareup of some intermittent abdominal pain and diarrhea with gaseousness.  She remains on probiotics  The patient has tapered atenolol to 1/2 tablet daily.  Cardiac status has been stable  Past Medical History:  Diagnosis Date  . Anxiety state, unspecified   . Disorder of bone and cartilage, unspecified   . Osteoarthrosis, unspecified whether generalized or localized, unspecified site   . Other and unspecified hyperlipidemia   . Palpitations   . Shortness of breath   . Unspecified essential hypertension      Social History   Socioeconomic History  . Marital status: Married    Spouse name: Not on file  . Number of children: Not on file  . Years of education: Not on file  . Highest education level: Not on file  Social Needs  . Financial resource strain: Not on file  . Food insecurity - worry: Not on file  . Food insecurity - inability: Not on file  . Transportation needs - medical: Not on file  . Transportation needs - non-medical: Not on file    Occupational History  . Not on file  Tobacco Use  . Smoking status: Never Smoker  . Smokeless tobacco: Never Used  Substance and Sexual Activity  . Alcohol use: No  . Drug use: No  . Sexual activity: Not on file  Other Topics Concern  . Not on file  Social History Narrative  . Not on file    Past Surgical History:  Procedure Laterality Date  . BILATERAL SALPINGOOPHORECTOMY  2001  . BREAST CYST ASPIRATION  2001  . BREAST EXCISIONAL BIOPSY Right 09/12/2015  . BREAST LUMPECTOMY WITH RADIOACTIVE SEED LOCALIZATION Right 09/12/2015   Procedure: RIGHT BREAST LUMPECTOMY WITH RADIOACTIVE SEED LOCALIZATION;  Surgeon: Erroll Luna, MD;  Location: [facility];  Service: General;  Laterality: Right;  . COLONOSCOPY  [id number]  . LEFT HEART CATHETERIZATION WITH CORONARY ANGIOGRAM N/A 06/26/2014   Procedure: LEFT HEART CATHETERIZATION WITH CORONARY ANGIOGRAM;  Surgeon: Leonie Man, MD;  Location: [facility] CATH LAB;  Service: Cardiovascular;  Laterality: N/A;  . TONSILLECTOMY  1957  . TOTAL ABDOMINAL HYSTERECTOMY  2001    Family History  Problem Relation Age of Onset  . Pneumonia Mother   . Asthma Unknown   . Allergies Unknown   . Colon cancer Neg Hx   . Stomach cancer Neg Hx     Allergies  Allergen Reactions  . Penicillins     REACTION: rash  . Tetracycline     REACTION: rash  Current Outpatient Medications on File Prior to Visit  Medication Sig Dispense Refill  . aspirin 81 MG EC tablet Take 81 mg by mouth daily.      . Calcium Citrate-Vitamin D (CITRACAL PETITES/VITAMIN D PO) Take 1 tablet by mouth daily.      . Cholecalciferol (VITAMIN D3) 2000 UNITS TABS Take 1 tablet by mouth daily.      . Cyanocobalamin (VITAMIN B-12 PO) Take 1 tablet by mouth daily.    Marland Kitchen estradiol (VIVELLE-DOT) 0.075 MG/24HR estradiol 0.075 mg/24 hr semiweekly transdermal patch  APPLY ONE PATCH EXTERNALLY TWICE PER WEEK    . estradiol (VIVELLE-DOT) 0.1 MG/24HR patch Vivelle-Dot 0.1 mg/24 hr  transdermal patch  Apply 1 patch twice a week by transdermal route.    . hydrochlorothiazide (HYDRODIURIL) 12.5 MG tablet TAKE 1 TABLET BY MOUTH ONCE DAILY 90 tablet 1  . KLOR-CON M10 10 MEQ tablet TAKE 2 TABLETS BY MOUTH ONCE DAILY 180 tablet 3  . meclizine (ANTIVERT) 25 MG tablet Take 1 tablet (25 mg total) by mouth 2 (two) times daily as needed for dizziness. 30 tablet 0  . pravastatin (PRAVACHOL) 40 MG tablet TAKE ONE TABLET BY MOUTH AT BEDTIME 90 tablet 0  . [DISCONTINUED] potassium chloride (K-DUR) 10 MEQ tablet Take 2 capsules daily 180 tablet 3   No current facility-administered medications on file prior to visit.     BP 122/62 (BP Location: Right Arm, Patient Position: Sitting, Cuff Size: Normal)   Pulse 71   Temp 98.2 F (36.8 C) (Oral)   Ht 5' (1.524 m)   Wt 127 lb (57.6 kg)   SpO2 96%   BMI 24.80 kg/m   Subsequent Medicare wellness visit  1. Risk factors, based on past  M,S,F history.  Patient has a history of nonobstructive coronary artery disease.  Cardiac risk factors include hypertension and dyslipidemia  2.  Physical activities:does exercise fairly regularly, uses a elliptical machine 3 times weekly  3.  Depression/mood:no history of major depression  4.  Hearing:no deficits  5.  ADL's:independent  6.  Fall risk:low  7.  Home safety:no problems identified 8.  Height weight, and visual acuity; height and weight stable no change in visual acuity.  Does have yearly eye examinations  9.  Counseling:continue heart healthy diet and regular exercise  10. Lab orders based on risk factors: laboratory profile reviewed; cholesterol profile not quite as favorable  11. Referral : Receives annual mammogram.  Has had a fairly recent breast biopsy  12. Care plan:continue efforts at aggressive risk factor modification  13. Cognitive assessment: alert and oriented with normal affect no cognitive dysfunction  14. Screening: Patient provided with a written and  personalized 5-10 year screening schedule in the AVS.    15. Provider List Update: primary care cardiology, GI OB/GYN.  General surgery ophthalmology   Review of Systems  Gastrointestinal: Positive for abdominal pain and diarrhea.       Objective:   Physical Exam  Constitutional: She is oriented to person, place, and time. She appears well-developed and well-nourished.  HENT:  Head: Normocephalic and atraumatic.  Right Ear: External ear normal.  Left Ear: External ear normal.  Mouth/Throat: Oropharynx is clear and moist.  Eyes: Conjunctivae and EOM are normal.  Neck: Normal range of motion. Neck supple. No JVD present. No thyromegaly present.  Cardiovascular: Normal rate, regular rhythm, normal heart sounds and intact distal pulses.  No murmur heard. Dorsalis pedis pulses not easily palpable.  Posterior tibial pulses intact.  No ischemic  changes  Pulmonary/Chest: Effort normal and breath sounds normal. She has no wheezes. She has no rales.  Abdominal: Soft. Bowel sounds are normal. She exhibits no distension and no mass. There is no tenderness. There is no rebound and no guarding.  Musculoskeletal: Normal range of motion. She exhibits no edema or tenderness.  Neurological: She is alert and oriented to person, place, and time. She has normal reflexes. No cranial nerve deficit. She exhibits normal muscle tone. Coordination normal.  Skin: Skin is warm and dry. No rash noted.  Psychiatric: She has a normal mood and affect. Her behavior is normal.          Assessment & Plan:  Preventive health care  Subsequent Medicare wellness visit Coronary artery disease.  Remains asymptomatic.  Will continue aspirin and statin therapy and low-dose beta-blocker IBS.  Recent mild flare. Dyslipidemia continue statin therapy Essential hypertension stable  Follow-up 6-12 months Review updated lab  [facility]

## 2017-08-11 NOTE — Patient Instructions (Addendum)
Limit your sodium (Salt) intake    It is important that you exercise regularly, at least 20 minutes 3 to 4 times per week.  If you develop chest pain or shortness of breath seek  medical attention.  Return in one year for follow-up   Health Maintenance for Postmenopausal Women Menopause is a normal process in which your reproductive ability comes to an end. This process happens gradually over a span of months to years, usually between the ages of 64 and 35. Menopause is complete when you have missed 12 consecutive menstrual periods. It is important to talk with your health care provider about some of the most common conditions that affect postmenopausal women, such as heart disease, cancer, and bone loss (osteoporosis). Adopting a healthy lifestyle and getting preventive care can help to promote your health and wellness. Those actions can also lower your chances of developing some of these common conditions. What should I know about menopause? During menopause, you may experience a number of symptoms, such as:  Moderate-to-severe hot flashes.  Night sweats.  Decrease in sex drive.  Mood swings.  Headaches.  Tiredness.  Irritability.  Memory problems.  Insomnia.  Choosing to treat or not to treat menopausal changes is an individual decision that you make with your health care provider. What should I know about hormone replacement therapy and supplements? Hormone therapy products are effective for treating symptoms that are associated with menopause, such as hot flashes and night sweats. Hormone replacement carries certain risks, especially as you become older. If you are thinking about using estrogen or estrogen with progestin treatments, discuss the benefits and risks with your health care provider. What should I know about heart disease and stroke? Heart disease, heart attack, and stroke become more likely as you age. This may be due, in part, to the hormonal changes that your  body experiences during menopause. These can affect how your body processes dietary fats, triglycerides, and cholesterol. Heart attack and stroke are both medical emergencies. There are many things that you can do to help prevent heart disease and stroke:  Have your blood pressure checked at least every 1-2 years. High blood pressure causes heart disease and increases the risk of stroke.  If you are 71-34 years old, ask your health care provider if you should take aspirin to prevent a heart attack or a stroke.  Do not use any tobacco products, including cigarettes, chewing tobacco, or electronic cigarettes. If you need help quitting, ask your health care provider.  It is important to eat a healthy diet and maintain a healthy weight. ? Be sure to include plenty of vegetables, fruits, low-fat dairy products, and lean protein. ? Avoid eating foods that are high in solid fats, added sugars, or salt (sodium).  Get regular exercise. This is one of the most important things that you can do for your health. ? Try to exercise for at least 150 minutes each week. The type of exercise that you do should increase your heart rate and make you sweat. This is known as moderate-intensity exercise. ? Try to do strengthening exercises at least twice each week. Do these in addition to the moderate-intensity exercise.  Know your numbers.Ask your health care provider to check your cholesterol and your blood glucose. Continue to have your blood tested as directed by your health care provider.  What should I know about cancer screening? There are several types of cancer. Take the following steps to reduce your risk and to catch any cancer  development as early as possible. Breast Cancer  Practice breast self-awareness. ? This means understanding how your breasts normally appear and feel. ? It also means doing regular breast self-exams. Let your health care provider know about any changes, no matter how small.  If  you are 1 or older, have a clinician do a breast exam (clinical breast exam or CBE) every year. Depending on your age, family history, and medical history, it may be recommended that you also have a yearly breast X-ray (mammogram).  If you have a family history of breast cancer, talk with your health care provider about genetic screening.  If you are at high risk for breast cancer, talk with your health care provider about having an MRI and a mammogram every year.  Breast cancer (BRCA) gene test is recommended for women who have family members with BRCA-related cancers. Results of the assessment will determine the need for genetic counseling and BRCA1 and for BRCA2 testing. BRCA-related cancers include these types: ? Breast. This occurs in males or females. ? Ovarian. ? Tubal. This may also be called fallopian tube cancer. ? Cancer of the abdominal or pelvic lining (peritoneal cancer). ? Prostate. ? Pancreatic.  Cervical, Uterine, and Ovarian Cancer Your health care provider may recommend that you be screened regularly for cancer of the pelvic organs. These include your ovaries, uterus, and vagina. This screening involves a pelvic exam, which includes checking for microscopic changes to the surface of your cervix (Pap test).  For women ages 21-65, health care providers may recommend a pelvic exam and a Pap test every three years. For women ages 4-65, they may recommend the Pap test and pelvic exam, combined with testing for human papilloma virus (HPV), every five years. Some types of HPV increase your risk of cervical cancer. Testing for HPV may also be done on women of any age who have unclear Pap test results.  Other health care providers may not recommend any screening for nonpregnant women who are considered low risk for pelvic cancer and have no symptoms. Ask your health care provider if a screening pelvic exam is right for you.  If you have had past treatment for cervical cancer or a  condition that could lead to cancer, you need Pap tests and screening for cancer for at least 20 years after your treatment. If Pap tests have been discontinued for you, your risk factors (such as having a new sexual partner) need to be reassessed to determine if you should start having screenings again. Some women have medical problems that increase the chance of getting cervical cancer. In these cases, your health care provider may recommend that you have screening and Pap tests more often.  If you have a family history of uterine cancer or ovarian cancer, talk with your health care provider about genetic screening.  If you have vaginal bleeding after reaching menopause, tell your health care provider.  There are currently no reliable tests available to screen for ovarian cancer.  Lung Cancer Lung cancer screening is recommended for adults 59-63 years old who are at high risk for lung cancer because of a history of smoking. A yearly low-dose CT scan of the lungs is recommended if you:  Currently smoke.  Have a history of at least 30 pack-years of smoking and you currently smoke or have quit within the past 15 years. A pack-year is smoking an average of one pack of cigarettes per day for one year.  Yearly screening should:  Continue until it  has been 15 years since you quit.  Stop if you develop a health problem that would prevent you from having lung cancer treatment.  Colorectal Cancer  This type of cancer can be detected and can often be prevented.  Routine colorectal cancer screening usually begins at age 19 and continues through age 71.  If you have risk factors for colon cancer, your health care provider may recommend that you be screened at an earlier age.  If you have a family history of colorectal cancer, talk with your health care provider about genetic screening.  Your health care provider may also recommend using home test kits to check for hidden blood in your stool.  A  small camera at the end of a tube can be used to examine your colon directly (sigmoidoscopy or colonoscopy). This is done to check for the earliest forms of colorectal cancer.  Direct examination of the colon should be repeated every 5-10 years until age 51. However, if early forms of precancerous polyps or small growths are found or if you have a family history or genetic risk for colorectal cancer, you may need to be screened more often.  Skin Cancer  Check your skin from head to toe regularly.  Monitor any moles. Be sure to tell your health care provider: ? About any new moles or changes in moles, especially if there is a change in a mole's shape or color. ? If you have a mole that is larger than the size of a pencil eraser.  If any of your family members has a history of skin cancer, especially at a young age, talk with your health care provider about genetic screening.  Always use sunscreen. Apply sunscreen liberally and repeatedly throughout the day.  Whenever you are outside, protect yourself by wearing long sleeves, pants, a wide-brimmed hat, and sunglasses.  What should I know about osteoporosis? Osteoporosis is a condition in which bone destruction happens more quickly than new bone creation. After menopause, you may be at an increased risk for osteoporosis. To help prevent osteoporosis or the bone fractures that can happen because of osteoporosis, the following is recommended:  If you are 75-43 years old, get at least 1,000 mg of calcium and at least 600 mg of vitamin D per day.  If you are older than age 38 but younger than age 88, get at least 1,200 mg of calcium and at least 600 mg of vitamin D per day.  If you are older than age 21, get at least 1,200 mg of calcium and at least 800 mg of vitamin D per day.  Smoking and excessive alcohol intake increase the risk of osteoporosis. Eat foods that are rich in calcium and vitamin D, and do weight-bearing exercises several times  each week as directed by your health care provider. What should I know about how menopause affects my mental health? Depression may occur at any age, but it is more common as you become older. Common symptoms of depression include:  Low or sad mood.  Changes in sleep patterns.  Changes in appetite or eating patterns.  Feeling an overall lack of motivation or enjoyment of activities that you previously enjoyed.  Frequent crying spells.  Talk with your health care provider if you think that you are experiencing depression. What should I know about immunizations? It is important that you get and maintain your immunizations. These include:  Tetanus, diphtheria, and pertussis (Tdap) booster vaccine.  Influenza every year before the flu season begins.  Pneumonia vaccine.  Shingles vaccine.  Your health care provider may also recommend other immunizations. This information is not intended to replace advice given to you by your health care provider. Make sure you discuss any questions you have with your health care provider. Document Released: 07/03/2005 Document Revised: 11/29/2015 Document Reviewed: 02/12/2015 Elsevier Interactive Patient Education  2018 [employer]  

## 2017-08-16 ENCOUNTER — Other Ambulatory Visit: Payer: Self-pay | Admitting: Internal Medicine

## 2017-09-16 ENCOUNTER — Other Ambulatory Visit: Payer: Self-pay | Admitting: Internal Medicine

## 2017-10-25 ENCOUNTER — Other Ambulatory Visit: Payer: Self-pay | Admitting: Internal Medicine

## 2017-10-25 DIAGNOSIS — Z1231 Encounter for screening mammogram for malignant neoplasm of breast: Secondary | ICD-10-CM

## 2017-11-11 ENCOUNTER — Encounter: Payer: PPO | Admitting: Adult Health

## 2017-11-15 ENCOUNTER — Other Ambulatory Visit: Payer: Self-pay | Admitting: Internal Medicine

## 2017-11-17 ENCOUNTER — Ambulatory Visit
Admission: RE | Admit: 2017-11-17 | Discharge: 2017-11-17 | Disposition: A | Discharge status: Home / Self Care | Payer: PPO | Source: Ambulatory Visit | Attending: Internal Medicine | Admitting: Internal Medicine

## 2017-11-17 DIAGNOSIS — Z1231 Encounter for screening mammogram for malignant neoplasm of breast: Secondary | ICD-10-CM

## 2017-11-17 NOTE — Telephone Encounter (Signed)
Sent to the pharmacy by e-scribe for 9 months.  Last cpx on 08/11/17 with last lipid panel.

## 2017-12-22 ENCOUNTER — Encounter: Payer: Self-pay | Admitting: Adult Health

## 2017-12-22 ENCOUNTER — Ambulatory Visit (INDEPENDENT_AMBULATORY_CARE_PROVIDER_SITE_OTHER): Payer: PPO | Admitting: Adult Health

## 2017-12-22 VITALS — BP 120/70 | Temp 97.8°F | Wt 126.0 lb

## 2017-12-22 DIAGNOSIS — I10 Essential (primary) hypertension: Secondary | ICD-10-CM | POA: Diagnosis not present

## 2017-12-22 DIAGNOSIS — E785 Hyperlipidemia, unspecified: Secondary | ICD-10-CM | POA: Diagnosis not present

## 2017-12-22 DIAGNOSIS — Z7689 Persons encountering health services in other specified circumstances: Secondary | ICD-10-CM

## 2017-12-22 NOTE — Progress Notes (Signed)
Patient presents to clinic today to establish care. She is a pleasant 77 year old female who  has a past medical history of Anxiety state, unspecified, Disorder of bone and cartilage, unspecified, Osteoarthrosis, unspecified whether generalized or localized, unspecified site, Other and unspecified hyperlipidemia, Palpitations, Shortness of breath, and Unspecified essential hypertension.  She is a former patient of Dr. Raliegh Ip who is transitioning to me upon his retirement.   Her last CPE was in 07/2017    Acute Concerns: Establish Care  Chronic Issues:  Essential Hypertension - controlled with Atenolol 25 mg and HCTZ 12.5 mg daily.  BP Readings from Last 3 Encounters:  12/22/17 120/70  08/11/17 122/62  06/13/17 (!) 127/55   Dyslipidemia - controlled with statin  Lab Results  Component Value Date   CHOL 191 08/11/2017   HDL 53.50 08/11/2017   LDLCALC 111 (H) 08/11/2017   TRIG 131.0 08/11/2017   CHOLHDL 4 08/11/2017   CAD - history of heart cath in Feb 2016.   IBS - has been an ongoing issue for many years. Affects her more in the morning. She takes imodium as needed and this works decent for her   Health Maintenance: Dental -- Routine Care  Vision -- Routine  Immunizations -- UTD  Colonoscopy -- 2014  Mammogram --2019 - underwent breast biopsy in 2018 - benign PAP -- No longer needed  Bone Density -- Multiple years ago.  Diet: Tries to eat healthy Exercise: Routine exercise. She uses an elliptical 3 x a week   Followed by GYN - Routine Care  Dermatology - Routine Care   Past Medical History:  Diagnosis Date  . Anxiety state, unspecified   . Disorder of bone and cartilage, unspecified   . Osteoarthrosis, unspecified whether generalized or localized, unspecified site   . Other and unspecified hyperlipidemia   . Palpitations   . Shortness of breath   . Unspecified essential hypertension     Past Surgical History:  Procedure Laterality Date  . BILATERAL  SALPINGOOPHORECTOMY  2001  . BREAST CYST ASPIRATION  2001  . BREAST EXCISIONAL BIOPSY Right 09/12/2015  . BREAST LUMPECTOMY WITH RADIOACTIVE SEED LOCALIZATION Right 09/12/2015   Procedure: RIGHT BREAST LUMPECTOMY WITH RADIOACTIVE SEED LOCALIZATION;  Surgeon: Erroll Luna, MD;  Location: [place];  Service: General;  Laterality: Right;  . COLONOSCOPY  [id number]  . LEFT HEART CATHETERIZATION WITH CORONARY ANGIOGRAM N/A 06/26/2014   Procedure: LEFT HEART CATHETERIZATION WITH CORONARY ANGIOGRAM;  Surgeon: Leonie Man, MD;  Location: [facility] CATH LAB;  Service: Cardiovascular;  Laterality: N/A;  . TONSILLECTOMY  1957  . TOTAL ABDOMINAL HYSTERECTOMY  2001    Current Outpatient Medications on File Prior to Visit  Medication Sig Dispense Refill  . aspirin 81 MG EC tablet Take 81 mg by mouth daily.      Marland Kitchen atenolol (TENORMIN) 50 MG tablet Take 0.5 tablets (25 mg total) by mouth daily. 180 tablet 1  . Calcium Citrate-Vitamin D (CITRACAL PETITES/VITAMIN D PO) Take 1 tablet by mouth daily.      . Cholecalciferol (VITAMIN D3) 2000 UNITS TABS Take 1 tablet by mouth daily.      . Cyanocobalamin (VITAMIN B-12 PO) Take 1 tablet by mouth daily.    Marland Kitchen estradiol (VIVELLE-DOT) 0.05 MG/24HR patch APPLY 1 PATCH TOPICALLY TWICE A WEEK  12  . hydrochlorothiazide (HYDRODIURIL) 12.5 MG tablet TAKE 1 TABLET BY MOUTH ONCE DAILY 90 tablet 1  . KLOR-CON M10 10 MEQ tablet TAKE 2 TABLETS BY MOUTH  ONCE DAILY 180 tablet 3  . meclizine (ANTIVERT) 25 MG tablet Take 1 tablet (25 mg total) by mouth 2 (two) times daily as needed for dizziness. 30 tablet 0  . pravastatin (PRAVACHOL) 40 MG tablet TAKE 1 TABLET BY MOUTH AT BEDTIME 90 tablet 2  . [DISCONTINUED] potassium chloride (K-DUR) 10 MEQ tablet Take 2 capsules daily 180 tablet 3   No current facility-administered medications on file prior to visit.     Allergies  Allergen Reactions  . Penicillins     REACTION: rash  . Tetracycline     REACTION: rash     Family History  Problem Relation Age of Onset  . Pneumonia Mother   . Asthma Unknown   . Allergies Unknown   . Colon cancer Neg Hx   . Stomach cancer Neg Hx     Social History   Socioeconomic History  . Marital status: Married    Spouse name: Not on file  . Number of children: Not on file  . Years of education: Not on file  . Highest education level: Not on file  Occupational History  . Not on file  Social Needs  . Financial resource strain: Not on file  . Food insecurity:    Worry: Not on file    Inability: Not on file  . Transportation needs:    Medical: Not on file    Non-medical: Not on file  Tobacco Use  . Smoking status: Never Smoker  . Smokeless tobacco: Never Used  Substance and Sexual Activity  . Alcohol use: No  . Drug use: No  . Sexual activity: Not on file  Lifestyle  . Physical activity:    Days per week: Not on file    Minutes per session: Not on file  . Stress: Not on file  Relationships  . Social connections:    Talks on phone: Not on file    Gets together: Not on file    Attends religious service: Not on file    Active member of club or organization: Not on file    Attends meetings of clubs or organizations: Not on file    Relationship status: Not on file  . Intimate partner violence:    Fear of current or ex partner: Not on file    Emotionally abused: Not on file    Physically abused: Not on file    Forced sexual activity: Not on file  Other Topics Concern  . Not on file  Social History Narrative  . Not on file    Review of Systems  Constitutional: Negative.   HENT: Negative.   Respiratory: Negative.   Cardiovascular: Negative.   Gastrointestinal: Positive for diarrhea.  Genitourinary: Negative.   Musculoskeletal: Negative.   Skin: Negative.   Neurological: Negative.   Endo/Heme/Allergies: Negative.   Psychiatric/Behavioral: Negative.   All other systems reviewed and are negative.      BP 120/70   Temp 97.8 F (36.6  C) (Oral)   Wt 126 lb (57.2 kg)   BMI 24.61 kg/m   Physical Exam  Constitutional: She is oriented to person, place, and time. She appears well-developed and well-nourished. No distress.  Eyes: Pupils are equal, round, and reactive to light. EOM are normal.  Cardiovascular: Normal rate, regular rhythm, normal heart sounds and intact distal pulses. Exam reveals no gallop.  No murmur heard. Pulmonary/Chest: Effort normal and breath sounds normal. No respiratory distress.  Neurological: She is alert and oriented to person, place, and time.  Skin:  Skin is warm and dry. Capillary refill takes less than 2 seconds. She is not diaphoretic.  Psychiatric: She has a normal mood and affect. Her behavior is normal. Judgment and thought content normal.  Vitals reviewed.  Assessment/Plan: 1. Encounter to establish care - Follow up in March 2020 for CPE or sooner if needed - Continue with diet and exercise   2. Dyslipidemia - Continue with statin   3. Essential hypertension - well controlled.  No change in medications   Dorothyann Peng, NP

## 2018-01-04 DIAGNOSIS — Z01419 Encounter for gynecological examination (general) (routine) without abnormal findings: Secondary | ICD-10-CM | POA: Diagnosis not present

## 2018-01-04 DIAGNOSIS — Z780 Asymptomatic menopausal state: Secondary | ICD-10-CM | POA: Diagnosis not present

## 2018-01-04 DIAGNOSIS — Z1389 Encounter for screening for other disorder: Secondary | ICD-10-CM | POA: Diagnosis not present

## 2018-01-04 DIAGNOSIS — Z6825 Body mass index (BMI) 25.0-25.9, adult: Secondary | ICD-10-CM | POA: Diagnosis not present

## 2018-01-04 DIAGNOSIS — Z130 Encounter for screening for diseases of the blood and blood-forming organs and certain disorders involving the immune mechanism: Secondary | ICD-10-CM | POA: Diagnosis not present

## 2018-01-04 DIAGNOSIS — Z79890 Hormone replacement therapy (postmenopausal): Secondary | ICD-10-CM | POA: Diagnosis not present

## 2018-02-17 DIAGNOSIS — H40013 Open angle with borderline findings, low risk, bilateral: Secondary | ICD-10-CM | POA: Diagnosis not present

## 2018-02-17 DIAGNOSIS — H52223 Regular astigmatism, bilateral: Secondary | ICD-10-CM | POA: Diagnosis not present

## 2018-02-17 DIAGNOSIS — H5203 Hypermetropia, bilateral: Secondary | ICD-10-CM | POA: Diagnosis not present

## 2018-02-17 DIAGNOSIS — H25812 Combined forms of age-related cataract, left eye: Secondary | ICD-10-CM | POA: Diagnosis not present

## 2018-02-17 DIAGNOSIS — H21233 Degeneration of iris (pigmentary), bilateral: Secondary | ICD-10-CM | POA: Diagnosis not present

## 2018-02-17 DIAGNOSIS — H40053 Ocular hypertension, bilateral: Secondary | ICD-10-CM | POA: Diagnosis not present

## 2018-02-28 DIAGNOSIS — H2512 Age-related nuclear cataract, left eye: Secondary | ICD-10-CM | POA: Diagnosis not present

## 2018-02-28 DIAGNOSIS — H2511 Age-related nuclear cataract, right eye: Secondary | ICD-10-CM | POA: Diagnosis not present

## 2018-02-28 DIAGNOSIS — H353131 Nonexudative age-related macular degeneration, bilateral, early dry stage: Secondary | ICD-10-CM | POA: Diagnosis not present

## 2018-02-28 DIAGNOSIS — H04123 Dry eye syndrome of bilateral lacrimal glands: Secondary | ICD-10-CM | POA: Diagnosis not present

## 2018-02-28 DIAGNOSIS — H35372 Puckering of macula, left eye: Secondary | ICD-10-CM | POA: Diagnosis not present

## 2018-03-18 ENCOUNTER — Other Ambulatory Visit: Payer: Self-pay | Admitting: Internal Medicine

## 2018-03-25 DIAGNOSIS — L820 Inflamed seborrheic keratosis: Secondary | ICD-10-CM | POA: Diagnosis not present

## 2018-03-25 DIAGNOSIS — D229 Melanocytic nevi, unspecified: Secondary | ICD-10-CM | POA: Diagnosis not present

## 2018-03-31 DIAGNOSIS — H25812 Combined forms of age-related cataract, left eye: Secondary | ICD-10-CM | POA: Diagnosis not present

## 2018-03-31 DIAGNOSIS — Z9842 Cataract extraction status, left eye: Secondary | ICD-10-CM | POA: Diagnosis not present

## 2018-03-31 DIAGNOSIS — H5202 Hypermetropia, left eye: Secondary | ICD-10-CM | POA: Diagnosis not present

## 2018-03-31 DIAGNOSIS — H52222 Regular astigmatism, left eye: Secondary | ICD-10-CM | POA: Diagnosis not present

## 2018-03-31 DIAGNOSIS — H2512 Age-related nuclear cataract, left eye: Secondary | ICD-10-CM | POA: Diagnosis not present

## 2018-03-31 DIAGNOSIS — Z961 Presence of intraocular lens: Secondary | ICD-10-CM | POA: Diagnosis not present

## 2018-04-18 DIAGNOSIS — H25811 Combined forms of age-related cataract, right eye: Secondary | ICD-10-CM | POA: Diagnosis not present

## 2018-04-18 DIAGNOSIS — Z9849 Cataract extraction status, unspecified eye: Secondary | ICD-10-CM | POA: Diagnosis not present

## 2018-04-18 DIAGNOSIS — H52223 Regular astigmatism, bilateral: Secondary | ICD-10-CM | POA: Diagnosis not present

## 2018-04-18 DIAGNOSIS — H2511 Age-related nuclear cataract, right eye: Secondary | ICD-10-CM | POA: Diagnosis not present

## 2018-04-18 DIAGNOSIS — Z961 Presence of intraocular lens: Secondary | ICD-10-CM | POA: Diagnosis not present

## 2018-07-14 ENCOUNTER — Telehealth: Payer: Self-pay | Admitting: Internal Medicine

## 2018-07-18 NOTE — Telephone Encounter (Signed)
Patient calling to check on this prescription. Has had her [facility] appointment with Tommi Rumps. Please advise.

## 2018-07-18 NOTE — Telephone Encounter (Signed)
Requested medication (s) are due for refill today: Yes  Requested medication (s) are on the active medication list: Yes  Last refill:  07/07/17  Future visit scheduled: No  Notes to clinic:  Unable to refill, expired Rx, last refill by Dr. Raliegh Ip     Requested Prescriptions  Pending Prescriptions Disp Refills   potassium chloride (K-DUR,KLOR-CON) 10 MEQ tablet [Pharmacy Med Name: Potassium Chloride Crys ER 10 MEQ Oral Tablet Extended Release] 180 tablet 0    Sig: TAKE 2 [facility] DAILY     Endocrinology:  Minerals - Potassium Supplementation Passed - 07/18/2018 10:38 AM      Passed - K in normal range and within 360 days    Potassium  Date Value Ref Range Status  08/11/2017 4.1 3.5 - 5.1 mEq/L Final         Passed - Cr in normal range and within 360 days    Creatinine, Ser  Date Value Ref Range Status  08/11/2017 0.78 0.40 - 1.20 mg/dL Final         Passed - Valid encounter within last 12 months    Recent Outpatient Visits          6 months ago Dyslipidemia   Therapist, music at [facility] [employer], [place], NP   11 months ago [employer] annual wellness visit, subsequent   Therapist, music at [facility], Doretha Sou, MD   1 year ago Encounter for preventive health examination   Therapist, music at [facility] [facility], Doretha Sou, MD   2 years ago Essential hypertension   Therapist, music at [employer] [facility], Doretha Sou, MD   2 years ago Theme park manager at [employer], Doretha Sou, MD

## 2018-07-19 ENCOUNTER — Encounter: Payer: Self-pay | Admitting: Family Medicine

## 2018-07-20 NOTE — Telephone Encounter (Signed)
Called and spoke to the pharmacy.  Rx was received earlier today.  Nothing further needed.

## 2018-07-20 NOTE — Telephone Encounter (Signed)
Pt called in and stated that [facility] still does not have this med.  She has checked with them a few times but they still do not have it. She would like to know if this can be resent.  She is completely out of meds

## 2018-07-20 NOTE — Telephone Encounter (Signed)
Pt calling to check status  °

## 2018-07-20 NOTE — Telephone Encounter (Signed)
Sent to the pharmacy by e-scribe for 90 days.  Pt is now scheduled for cpx on 08/17/2018.

## 2018-08-15 ENCOUNTER — Telehealth: Payer: Self-pay | Admitting: Adult Health

## 2018-08-15 NOTE — Telephone Encounter (Signed)
Copied from [place] [id number]. Topic: Quick Communication - Rx Refill/Question >> Aug 15, 2018  2:50 PM Waylan Rocher, [place] L wrote: Medication: pravastatin (PRAVACHOL) 40 MG tablet  Has the patient contacted their pharmacy? Yes.   (Agent: If no, request that the patient contact the pharmacy for the refill.) (Agent: If yes, when and what did the pharmacy advise?)  Preferred Pharmacy (with phone number or street name): [place], [place] - [address] [place] [place] [address] Phone: [phone] Fax: [phone]  Agent: Please be advised that RX refills may take up to 3 business days. We ask that you follow-up with your pharmacy.

## 2018-08-16 MED ORDER — PRAVASTATIN SODIUM 40 MG PO TABS: 40.0000 mg | ORAL_TABLET | Freq: Every day | ORAL | 0 refills | Status: DC

## 2018-08-16 NOTE — Telephone Encounter (Signed)
Sent to the pharmacy by es-cribe for 90 days due to COVID-19.

## 2018-08-17 ENCOUNTER — Encounter: Payer: PPO | Admitting: Adult Health

## 2018-09-23 ENCOUNTER — Telehealth: Payer: Self-pay | Admitting: Adult Health

## 2018-09-23 MED ORDER — ATENOLOL 50 MG PO TABS: 25.0000 mg | ORAL_TABLET | Freq: Every day | ORAL | 0 refills | Status: DC

## 2018-09-23 MED ORDER — HYDROCHLOROTHIAZIDE 12.5 MG PO TABS: 12.5000 mg | ORAL_TABLET | Freq: Every day | ORAL | 0 refills | Status: DC

## 2018-09-23 NOTE — Telephone Encounter (Signed)
Copied from [place] [id number]. Topic: Quick Communication - Rx Refill/Question >> Sep 23, 2018  2:55 PM Mcneil, Ja-Kwan wrote: Medication: hydrochlorothiazide (HYDRODIURIL) 12.5 MG tablet  and  atenolol (TENORMIN) 50 MG tablet  Has the patient contacted their pharmacy? yes   Preferred Pharmacy (with phone number or street name): [place], [place] - [address] [phone] (Phone) [phone] (Fax)  Agent: Please be advised that RX refills may take up to 3 business days. We ask that you follow-up with your pharmacy.

## 2018-09-23 NOTE — Telephone Encounter (Signed)
Sent to the pharmacy by e-scribe. 

## 2018-10-07 ENCOUNTER — Ambulatory Visit (INDEPENDENT_AMBULATORY_CARE_PROVIDER_SITE_OTHER): Payer: PPO | Admitting: Adult Health

## 2018-10-07 ENCOUNTER — Ambulatory Visit (INDEPENDENT_AMBULATORY_CARE_PROVIDER_SITE_OTHER): Payer: PPO

## 2018-10-07 ENCOUNTER — Telehealth: Payer: Self-pay | Admitting: Adult Health

## 2018-10-07 ENCOUNTER — Encounter: Payer: Self-pay | Admitting: Adult Health

## 2018-10-07 ENCOUNTER — Other Ambulatory Visit: Payer: Self-pay

## 2018-10-07 ENCOUNTER — Ambulatory Visit: Payer: Self-pay | Admitting: Adult Health

## 2018-10-07 VITALS — BP 110/70 | HR 79 | Temp 98.5°F | Wt 129.2 lb

## 2018-10-07 DIAGNOSIS — M79642 Pain in left hand: Secondary | ICD-10-CM

## 2018-10-07 DIAGNOSIS — M79641 Pain in right hand: Secondary | ICD-10-CM

## 2018-10-07 DIAGNOSIS — S6991XA Unspecified injury of right wrist, hand and finger(s), initial encounter: Secondary | ICD-10-CM | POA: Diagnosis not present

## 2018-10-07 DIAGNOSIS — S6992XA Unspecified injury of left wrist, hand and finger(s), initial encounter: Secondary | ICD-10-CM | POA: Diagnosis not present

## 2018-10-07 DIAGNOSIS — M7989 Other specified soft tissue disorders: Secondary | ICD-10-CM | POA: Diagnosis not present

## 2018-10-07 NOTE — Telephone Encounter (Signed)
Pt called in c/o of her right hand being numb.  She fell yesterday at 4:30 on uneven pavement.   She caught herself with her hands.  She also broke some of her teeth but has been seen by her dentist and had them repaired yesterday afternoon.  Her right hand is skinned up "into the meat" and her right wrist is red and sore.   Both hands are bruised and sore.  I let her know she should go to the ED however she does not want to go.   She really would rather come to the office.   I called the flow coordinator and she requested I send the information to them because Dorothyann Peng, NP is doing a virtual visit with a pt at the moment.  (COVID-19 pandemic going on).   They will call her back after checking with [facility].  Pt was agreeable to this plan.   I confirmed her phone numbers.   She asked that they call her landline  [phone].  I sent these notes to the office at [facility] high priority.   Reason for Disposition . [1] Numbness (loss of sensation) of finger(s) AND [2] present now  Answer Assessment - Initial Assessment Questions 1. MECHANISM: "How did the injury happen?"     *No Answer*I fell yesterday.    My right hand is numb.   I have a lot of bruising on the palm.  My wrist is red and bruised too.  I can move my right wrist but it's sore. I can move it.   I got up at 3:00 am and put ice on it.   It's not totally numb now. My left hand is not numb.   It's black and blue on both hands. 2. ONSET: "When did the injury happen?" (Minutes or hours ago)      I was walking I hit an uneven place in the pavement.   I broke my teeth and saw the dentist yesterday.   I reached out to catch myself and hurt my hands.   It happened at 4:30 PM.   3. APPEARANCE of INJURY: "What does the injury look like?"      They are bruised and sore. 4. SEVERITY: "Can you use the hand normally?" "Can you bend your fingers into a ball and then fully open them?"     It's sore but I can pick up a cup of coffee with  support. 5. SIZE: For cuts, bruises, or swelling, ask: "How large is it?" (e.g., inches or centimeters;  entire hand or wrist)      Bruising.    My left hand is skinned up pretty good due to the concrete. 6. PAIN: "Is there pain?" If so, ask: "How bad is the pain?"  (Scale 1-10; or mild, moderate, severe)     My right hand kept me up last night and the numbness concerns me.   I bruised my left knee but it's not hurting. 7. TETANUS: For any breaks in the skin, ask: "When was the last tetanus booster?"     I'm not sure. 8. OTHER SYMPTOMS: "Do you have any other symptoms?"      No    See above. 9. PREGNANCY: "Is there any chance you are pregnant?" "When was your last menstrual period?"     Not asked due to age  Protocols used: HAND AND WRIST INJURY-A-AH

## 2018-10-07 NOTE — Telephone Encounter (Signed)
Patient scheduled for an in office visit with [facility] for 2:00 PM today

## 2018-10-07 NOTE — Telephone Encounter (Signed)
Copied from [place] [id number] [id number]. Topic: Appointment Scheduling - Scheduling Inquiry for Clinic >> Oct 07, 2018  8:48 AM Mathis Bud wrote: Reason for CRM: Patient had a bad fall yesterday 5/14 at 430p.m. Patient fell on both wrist.  When she woke up this morning she is experiencing numbness in both hands.  She states that she is badly bruised up and also she went to dentist yesterday because she broke her teeth during the fall. Patient would like to schedule an appt. She is aware of virtual but would like to see PCP in person if possible. Patient call back [phone]

## 2018-10-07 NOTE — Progress Notes (Signed)
Subjective:    Patient ID: Katelyn [name], female    DOB: [birth year], 78 y.o.   MRN: [id number]  HPI 78 year old female who  has a past medical history of Anxiety state, unspecified, Disorder of bone and cartilage, unspecified, Osteoarthrosis, unspecified whether generalized or localized, unspecified site, Other and unspecified hyperlipidemia, Palpitations, Shortness of breath, and Unspecified essential hypertension.  78 year old female who is being evaluated today status post fall yesterday around 4 PM.  She reports that she was at her church walking in the parking lot and tripped causing her to fall forward onto concrete.  She braced herself with her hands, and hit her mouth on the concrete pavement.  She knocked out her front teeth.  She has been seen by the dentist and these teeth has been fixed.  Only she is complaining of bilateral hand and wrist pain worse on the right than left.  She does have some numbness and tingling to the first 3 digits of her right hand.  Not feel as though she has much pain but more soreness and does not feel as though she is lost any range of motion.  She denies any loss of consciousness or hitting her forehead on the ground.  Once the fall she has not experienced any lightheadedness, dizziness, or headaches.   Review of Systems See HPI   Past Medical History:  Diagnosis Date  . Anxiety state, unspecified   . Disorder of bone and cartilage, unspecified   . Osteoarthrosis, unspecified whether generalized or localized, unspecified site   . Other and unspecified hyperlipidemia   . Palpitations   . Shortness of breath   . Unspecified essential hypertension     Social History   Socioeconomic History  . Marital status: Married    Spouse name: Not on file  . Number of children: Not on file  . Years of education: Not on file  . Highest education level: Not on file  Occupational History  . Not on file  Social Needs  . Financial resource strain: Not  on file  . Food insecurity:    Worry: Not on file    Inability: Not on file  . Transportation needs:    Medical: Not on file    Non-medical: Not on file  Tobacco Use  . Smoking status: Never Smoker  . Smokeless tobacco: Never Used  Substance and Sexual Activity  . Alcohol use: No  . Drug use: No  . Sexual activity: Not on file  Lifestyle  . Physical activity:    Days per week: Not on file    Minutes per session: Not on file  . Stress: Not on file  Relationships  . Social connections:    Talks on phone: Not on file    Gets together: Not on file    Attends religious service: Not on file    Active member of club or organization: Not on file    Attends meetings of clubs or organizations: Not on file    Relationship status: Not on file  . Intimate partner violence:    Fear of current or ex partner: Not on file    Emotionally abused: Not on file    Physically abused: Not on file    Forced sexual activity: Not on file  Other Topics Concern  . Not on file  Social History Narrative   Teacher - elementary school    Married    Two children    Three grandchildren  She likes to read     Past Surgical History:  Procedure Laterality Date  . BILATERAL SALPINGOOPHORECTOMY  2001  . BREAST CYST ASPIRATION  2001  . BREAST EXCISIONAL BIOPSY Right 09/12/2015  . BREAST LUMPECTOMY WITH RADIOACTIVE SEED LOCALIZATION Right 09/12/2015   Procedure: RIGHT BREAST LUMPECTOMY WITH RADIOACTIVE SEED LOCALIZATION;  Surgeon: Erroll Luna, MD;  Location: [place];  Service: General;  Laterality: Right;  . COLONOSCOPY  [id number]  . LEFT HEART CATHETERIZATION WITH CORONARY ANGIOGRAM N/A 06/26/2014   Procedure: LEFT HEART CATHETERIZATION WITH CORONARY ANGIOGRAM;  Surgeon: Leonie Man, MD;  Location: [facility] CATH LAB;  Service: Cardiovascular;  Laterality: N/A;  . TONSILLECTOMY  1957  . TOTAL ABDOMINAL HYSTERECTOMY  2001    Family History  Problem Relation Age of Onset  .  Pneumonia Mother   . Stroke Mother   . Asthma Unknown   . Allergies Unknown   . Asthma Father   . Colon cancer Neg Hx   . Stomach cancer Neg Hx     Allergies  Allergen Reactions  . Penicillins     REACTION: rash  . Tetracycline     REACTION: rash    Current Outpatient Medications on File Prior to Visit  Medication Sig Dispense Refill  . aspirin 81 MG EC tablet Take 81 mg by mouth daily.      Marland Kitchen atenolol (TENORMIN) 50 MG tablet Take 0.5 tablets (25 mg total) by mouth daily. 180 tablet 0  . Calcium Citrate-Vitamin D (CITRACAL PETITES/VITAMIN D PO) Take 1 tablet by mouth daily.      . Cholecalciferol (VITAMIN D3) 2000 UNITS TABS Take 1 tablet by mouth daily.      . Cyanocobalamin (VITAMIN B-12 PO) Take 1 tablet by mouth daily.    Marland Kitchen estradiol (VIVELLE-DOT) 0.05 MG/24HR patch APPLY 1 PATCH TOPICALLY TWICE A WEEK  12  . hydrochlorothiazide (HYDRODIURIL) 12.5 MG tablet Take 1 tablet (12.5 mg total) by mouth daily. 90 tablet 0  . potassium chloride (K-DUR,KLOR-CON) 10 MEQ tablet TAKE 2 TABLETS BY MOUTH ONCE DAILY 180 tablet 0  . pravastatin (PRAVACHOL) 40 MG tablet Take 1 tablet (40 mg total) by mouth at bedtime. 90 tablet 0  . [DISCONTINUED] potassium chloride (K-DUR) 10 MEQ tablet Take 2 capsules daily 180 tablet 3   No current facility-administered medications on file prior to visit.     BP 110/70 (BP Location: Left Arm, Patient Position: Sitting, Cuff Size: Normal)   Pulse 79   Temp 98.5 F (36.9 C) (Oral)   Wt 129 lb 3.2 oz (58.6 kg)   SpO2 93%   BMI 25.23 kg/m       Objective:   Physical Exam Vitals signs and nursing note reviewed.  Constitutional:      Appearance: Normal appearance.  HENT:     Right Ear: Tympanic membrane, ear canal and external ear normal. There is no impacted cerumen. No hemotympanum.     Left Ear: Tympanic membrane, ear canal and external ear normal. There is no impacted cerumen. No hemotympanum.     Nose: Nose normal.     Mouth/Throat:      Mouth: Mucous membranes are moist.     Pharynx: Oropharynx is clear.  Eyes:     Extraocular Movements: Extraocular movements intact.     Pupils: Pupils are equal, round, and reactive to light.  Cardiovascular:     Rate and Rhythm: Normal rate and regular rhythm.     Pulses: Normal pulses.  Heart sounds: Normal heart sounds.  Skin:    Capillary Refill: Capillary refill takes less than 2 seconds.     Findings: Bruising present.     Comments: Bruising and trace swelling  noted to the palmar and dorsal aspects of bilateral hands.  Bruising also noted to medial aspect of right wrist.  Abrasions noted to tip of nose, chin, bilateral knees, and bilateral palmar surfaces of hands.  Neurological:     Mental Status: She is alert and oriented to person, place, and time.     Sensory: No sensory deficit.     Gait: Gait normal.     Deep Tendon Reflexes: Reflexes normal.     Comments: 4 out of 5 grip strength to hands bilaterally.  Positive for discomfort while performing grip strength exercise  Psychiatric:        Mood and Affect: Mood normal.        Behavior: Behavior normal.        Thought Content: Thought content normal.        Judgment: Judgment normal.       Assessment & Plan:  1. Pain in both hands -She has good range of motion and grip strength.  Doubt fracture will be get x-rays of bilateral hands and bilateral wrist.  She was advised to use Motrin and ice as needed for symptom relief.  Likely bruising and swelling causing some numbness and tingling in first 3 fingers of right hand, vies that this should improve as swelling decreases. - DG Hand Complete Right; Future - DG Wrist Complete Right; Future - DG Wrist Complete Left; Future - DG Hand Complete Left; Future  Dorothyann Peng, NP

## 2018-10-13 ENCOUNTER — Other Ambulatory Visit: Payer: Self-pay | Admitting: Adult Health

## 2018-10-18 NOTE — Telephone Encounter (Signed)
Sent to the pharmacy by e-scribe. 

## 2018-11-02 ENCOUNTER — Other Ambulatory Visit: Payer: Self-pay

## 2018-11-02 ENCOUNTER — Encounter: Payer: Self-pay | Admitting: Adult Health

## 2018-11-02 ENCOUNTER — Ambulatory Visit (INDEPENDENT_AMBULATORY_CARE_PROVIDER_SITE_OTHER): Payer: PPO | Admitting: Adult Health

## 2018-11-02 VITALS — BP 134/86 | Temp 98.0°F | Ht 60.5 in | Wt 128.0 lb

## 2018-11-02 DIAGNOSIS — E785 Hyperlipidemia, unspecified: Secondary | ICD-10-CM

## 2018-11-02 DIAGNOSIS — I10 Essential (primary) hypertension: Secondary | ICD-10-CM

## 2018-11-02 DIAGNOSIS — Z0000 Encounter for general adult medical examination without abnormal findings: Secondary | ICD-10-CM | POA: Diagnosis not present

## 2018-11-02 DIAGNOSIS — E559 Vitamin D deficiency, unspecified: Secondary | ICD-10-CM

## 2018-11-02 LAB — CBC WITH DIFFERENTIAL/PLATELET
Basophils Absolute: 0.1 10*3/uL (ref 0.0–0.1)
Basophils Relative: 0.6 % (ref 0.0–3.0)
Eosinophils Absolute: 0.2 10*3/uL (ref 0.0–0.7)
Eosinophils Relative: 1.9 % (ref 0.0–5.0)
HCT: 43.8 % (ref 36.0–46.0)
Hemoglobin: 14.7 g/dL (ref 12.0–15.0)
Lymphocytes Relative: 26.5 % (ref 12.0–46.0)
Lymphs Abs: 2.3 10*3/uL (ref 0.7–4.0)
MCHC: 33.5 g/dL (ref 30.0–36.0)
MCV: 91.6 fl (ref 78.0–100.0)
Monocytes Absolute: 0.5 10*3/uL (ref 0.1–1.0)
Monocytes Relative: 6.1 % (ref 3.0–12.0)
Neutro Abs: 5.5 10*3/uL (ref 1.4–7.7)
Neutrophils Relative %: 64.9 % (ref 43.0–77.0)
Platelets: 302 10*3/uL (ref 150.0–400.0)
RBC: 4.78 Mil/uL (ref 3.87–5.11)
RDW: 13.4 % (ref 11.5–15.5)
WBC: 8.5 10*3/uL (ref 4.0–10.5)

## 2018-11-02 LAB — COMPREHENSIVE METABOLIC PANEL
ALT: 19 U/L (ref 0–35)
AST: 20 U/L (ref 0–37)
Albumin: 4.6 g/dL (ref 3.5–5.2)
Alkaline Phosphatase: 63 U/L (ref 39–117)
BUN: 14 mg/dL (ref 6–23)
CO2: 29 mEq/L (ref 19–32)
Calcium: 9.8 mg/dL (ref 8.4–10.5)
Chloride: 104 mEq/L (ref 96–112)
Creatinine, Ser: 0.71 mg/dL (ref 0.40–1.20)
GFR: 79.86 mL/min (ref >60.00)
Glucose, Bld: 95 mg/dL (ref 70–99)
Potassium: 4.7 mEq/L (ref 3.5–5.1)
Sodium: 141 mEq/L (ref 135–145)
Total Bilirubin: 0.6 mg/dL (ref 0.2–1.2)
Total Protein: 7.3 g/dL (ref 6.0–8.3)

## 2018-11-02 LAB — LIPID PANEL
Cholesterol: 196 mg/dL (ref 0–200)
HDL: 52.2 mg/dL (ref >39.00)
LDL Cholesterol: 114 mg/dL — ABNORMAL HIGH (ref 0–99)
NonHDL: 143.84
Total CHOL/HDL Ratio: 4
Triglycerides: 147 mg/dL (ref 0.0–149.0)
VLDL: 29.4 mg/dL (ref 0.0–40.0)

## 2018-11-02 LAB — VITAMIN D 25 HYDROXY (VIT D DEFICIENCY, FRACTURES): VITD: 66.05 ng/mL (ref 30.00–100.00)

## 2018-11-02 LAB — TSH: TSH: 1.17 u[IU]/mL (ref 0.35–4.50)

## 2018-11-02 MED ORDER — PRAVASTATIN SODIUM 40 MG PO TABS: 40.0000 mg | ORAL_TABLET | Freq: Every day | ORAL | 3 refills | Status: DC

## 2018-11-02 NOTE — Progress Notes (Signed)
Subjective:    Patient ID: Katelyn [name], female    DOB: [birth year], 78 y.o.   MRN: [id number]  HPI Patient presents for yearly preventative medicine examination. Pleasant 78 year old female who  has a past medical history of Anxiety state, unspecified, Disorder of bone and cartilage, unspecified, Osteoarthrosis, unspecified whether generalized or localized, unspecified site, Other and unspecified hyperlipidemia, Palpitations, Shortness of breath, and Unspecified essential hypertension.  Essential Hypertension - Controlled well with Atenolol 25 mg and HCTZ 12.5mg . She denies headaches and blurred vision. She does report intermittent episodes of dizziness in the morning when she gets out of bed. In the morning she is checking her blood pressure and often gets readings in the 110's/60's.  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 Takes statin and ASA  IBS - suspected. Has been evaluated by GI. She takes imodium PRN   Vitamin D deficiency - takes Vitamin D 2000 units daily  All immunizations and health maintenance protocols were reviewed with the patient and needed orders were placed. UTD on vaccinations   Appropriate screening laboratory values were ordered for the patient including screening of hyperlipidemia, renal function and hepatic function.  Medication reconciliation,  past medical history, social history, problem list and allergies were reviewed in detail with the patient  Goals were established with regard to weight loss, exercise, and  diet in compliance with medications  End of  life planning was discussed.  She is followed by GYN and is up-to-date on routine screening mammograms.  She does participate in routine dental and vision screens.  No longer needs to have a colonoscopy due to age.  She has been seen by her dermatologist for yearly skin check   Review of Systems  Constitutional: Negative.   HENT: Negative.   Eyes: Negative.   Respiratory: Negative.   Cardiovascular: Negative.   Gastrointestinal: Negative.   Endocrine: Negative.   Genitourinary: Negative.   Musculoskeletal: Negative.   Skin: Negative.   Allergic/Immunologic: Negative.   Neurological: Negative.   Hematological: Negative.   Psychiatric/Behavioral: Negative.    Past Medical History:  Diagnosis Date  . Anxiety state, unspecified   . Disorder of bone and cartilage, unspecified   . Osteoarthrosis, unspecified whether generalized or localized, unspecified site   . Other and unspecified hyperlipidemia   . Palpitations   . Shortness of breath   . Unspecified essential hypertension     Social History   Socioeconomic History  . Marital status: Married    Spouse name: Not on file  . Number of children: Not on file  . Years of education: Not on file  . Highest education level: Not on file  Occupational History  . Not on file  Social Needs  . Financial resource strain: Not on file  . Food insecurity:    Worry: Not on file    Inability: Not on file  . Transportation needs:    Medical: Not on file    Non-medical: Not on file  Tobacco Use  . Smoking status: Never Smoker  . Smokeless tobacco: Never Used  Substance and Sexual Activity  . Alcohol use: No  . Drug use: No  . Sexual activity: Not on file  Lifestyle  . Physical activity:    Days per week: Not on file    Minutes per session: Not on file  . Stress: Not on file  Relationships  . Social connections:    Talks on phone: Not on file    Gets together: Not on file    Attends religious service: Not on file    Active  member of club or organization: Not on file    Attends meetings of clubs or organizations: Not on file    Relationship status: Not on file  . Intimate partner violence:    Fear of current or ex partner: Not on file    Emotionally abused: Not on file    Physically abused: Not on file    Forced sexual activity: Not on file  Other Topics Concern  . Not on file  Social History Narrative   Teacher - elementary school    Married    Two children    Three grandchildren       She likes to read     Past Surgical History:  Procedure Laterality Date  . BILATERAL SALPINGOOPHORECTOMY  2001  . BREAST CYST ASPIRATION  2001  . BREAST EXCISIONAL BIOPSY Right 09/12/2015  . BREAST LUMPECTOMY WITH RADIOACTIVE SEED LOCALIZATION Right 09/12/2015   Procedure: RIGHT BREAST LUMPECTOMY WITH RADIOACTIVE SEED LOCALIZATION;  Surgeon: Erroll Luna, MD;  Location: [place];  Service: General;  Laterality: Right;  . COLONOSCOPY  [id number]  . LEFT HEART CATHETERIZATION WITH CORONARY ANGIOGRAM N/A 06/26/2014   Procedure: LEFT HEART CATHETERIZATION WITH CORONARY ANGIOGRAM;  Surgeon: Leonie Man, MD;  Location: [facility] CATH [facility];  Service: Cardiovascular;  Laterality: N/A;  . TONSILLECTOMY  1957  . TOTAL ABDOMINAL HYSTERECTOMY  2001    Family History  Problem Relation Age of Onset  . Pneumonia Mother   . Stroke Mother   . Asthma Unknown   . Allergies Unknown   . Asthma Father   . Colon cancer Neg Hx   . Stomach cancer Neg Hx     Allergies  Allergen Reactions  . Penicillins     REACTION: rash  . Tetracycline     REACTION: rash    Current Outpatient Medications on File Prior to Visit  Medication Sig Dispense Refill  . aspirin 81 MG EC tablet Take 81 mg by mouth daily.      Marland Kitchen atenolol (TENORMIN) 50 MG tablet Take 0.5 tablets (25 mg total) by mouth daily. 180 tablet 0  . Calcium Citrate-Vitamin D (CITRACAL PETITES/VITAMIN D PO) Take 1 tablet by mouth daily.      . Cholecalciferol  (VITAMIN D3) 2000 UNITS TABS Take 1 tablet by mouth daily.      . Cyanocobalamin (VITAMIN B-12 PO) Take 1 tablet by mouth daily.    Marland Kitchen estradiol (VIVELLE-DOT) 0.05 MG/24HR patch APPLY 1 PATCH TOPICALLY TWICE A WEEK  12  . hydrochlorothiazide (HYDRODIURIL) 12.5 MG tablet Take 1 tablet (12.5 mg total) by mouth daily. 90 tablet 0  . potassium chloride (K-DUR) 10 MEQ tablet Take 2 tablets by mouth once daily 180 tablet 0  . pravastatin (PRAVACHOL) 40 MG tablet Take 1 tablet (40 mg total) by mouth at bedtime. 90 tablet 0  . [DISCONTINUED] potassium chloride (K-DUR) 10 MEQ tablet Take 2 capsules daily 180 tablet 3   No current facility-administered medications on file prior to  visit.     BP 134/86   Temp 98 F (36.7 C)   Ht 5' 0.5" (1.537 m)   Wt 128 lb (58.1 kg)   BMI 24.59 kg/m       Objective:   Physical Exam Vitals signs and nursing note reviewed.  Constitutional:      General: She is not in acute distress.    Appearance: Normal appearance. She is well-developed and normal weight.  HENT:     Head: Normocephalic and atraumatic.     Right Ear: Tympanic membrane, ear canal and external ear normal. There is no impacted cerumen.     Left Ear: Tympanic membrane, ear canal and external ear normal. There is no impacted cerumen.     Nose: Nose normal.     Mouth/Throat:     Pharynx: No oropharyngeal exudate.  Eyes:     General:        Right eye: No discharge.        Left eye: No discharge.     Conjunctiva/sclera: Conjunctivae normal.  Neck:     Musculoskeletal: Normal range of motion and neck supple.     Thyroid: No thyromegaly.     Trachea: No tracheal deviation.  Cardiovascular:     Rate and Rhythm: Normal rate and regular rhythm.     Pulses: Normal pulses.     Heart sounds: Normal heart sounds. No murmur. No friction rub. No gallop.   Pulmonary:     Effort: Pulmonary effort is normal. No respiratory distress.     Breath sounds: Normal breath sounds. No stridor. No wheezing,  rhonchi or rales.  Chest:     Chest wall: No tenderness.  Abdominal:     General: Bowel sounds are normal. There is no distension.     Palpations: Abdomen is soft. There is no mass.     Tenderness: There is no abdominal tenderness. There is no right CVA tenderness, left CVA tenderness, guarding or rebound.     Hernia: No hernia is present.  Musculoskeletal: Normal range of motion.  Lymphadenopathy:     Cervical: No cervical adenopathy.  Skin:    General: Skin is warm and dry.     Coloration: Skin is not pale.     Findings: No erythema or rash.  Neurological:     General: No focal deficit present.     Mental Status: She is alert and oriented to person, place, and time. Mental status is at baseline.     Cranial Nerves: No cranial nerve deficit.     Sensory: No sensory deficit.     Motor: No weakness.     Coordination: Coordination normal.     Gait: Gait normal.     Deep Tendon Reflexes: Reflexes normal.  Psychiatric:        Mood and Affect: Mood normal.        Behavior: Behavior normal.        Thought Content: Thought content normal.        Judgment: Judgment normal.       Assessment & Plan:  1. Routine general medical examination at a health care facility - Encouraged heart healthy diet and exercise - Follow up in one year or sooner if needed - CBC with Differential/Platelet - Comprehensive metabolic panel - Lipid panel - TSH  2. Dyslipidemia  - pravastatin (PRAVACHOL) 40 MG tablet; Take 1 tablet (40 mg total) by mouth at bedtime.  Dispense: 90 tablet; Refill: 3 - CBC with Differential/Platelet - Comprehensive metabolic  panel - Lipid panel - TSH  3. Essential hypertension - Will have her trial taking Atenolol in the morning and cutting out HCTZ/Potassium. She will monitor her BP at home and report readings to me in 2 weeks. Advised if BP goes above 140 consistently then to restart HCTZ/Potassium - CBC with Differential/Platelet - Comprehensive metabolic panel -  Lipid panel - TSH  4. Vitamin D deficiency  - Vitamin D, 25-hydroxy  Dorothyann Peng, NP

## 2018-11-28 ENCOUNTER — Other Ambulatory Visit: Payer: Self-pay | Admitting: Adult Health

## 2018-11-28 DIAGNOSIS — Z1231 Encounter for screening mammogram for malignant neoplasm of breast: Secondary | ICD-10-CM

## 2019-01-10 ENCOUNTER — Ambulatory Visit
Admission: RE | Admit: 2019-01-10 | Discharge: 2019-01-10 | Disposition: A | Discharge status: Home / Self Care | Payer: PPO | Source: Ambulatory Visit | Attending: Adult Health | Admitting: Adult Health

## 2019-01-10 ENCOUNTER — Other Ambulatory Visit: Payer: Self-pay

## 2019-01-10 DIAGNOSIS — Z1231 Encounter for screening mammogram for malignant neoplasm of breast: Secondary | ICD-10-CM | POA: Diagnosis not present

## 2019-02-22 ENCOUNTER — Ambulatory Visit (INDEPENDENT_AMBULATORY_CARE_PROVIDER_SITE_OTHER): Payer: PPO

## 2019-02-22 ENCOUNTER — Other Ambulatory Visit: Payer: Self-pay

## 2019-02-22 DIAGNOSIS — Z23 Encounter for immunization: Secondary | ICD-10-CM | POA: Diagnosis not present

## 2019-03-01 ENCOUNTER — Other Ambulatory Visit: Payer: Self-pay | Admitting: Adult Health

## 2019-03-01 NOTE — Telephone Encounter (Signed)
Sent to the pharmacy by e-scribe. 

## 2019-03-04 DIAGNOSIS — M545 Low back pain: Secondary | ICD-10-CM | POA: Diagnosis not present

## 2019-03-06 DIAGNOSIS — M5416 Radiculopathy, lumbar region: Secondary | ICD-10-CM | POA: Diagnosis not present

## 2019-03-08 DIAGNOSIS — M5416 Radiculopathy, lumbar region: Secondary | ICD-10-CM | POA: Diagnosis not present

## 2019-03-13 DIAGNOSIS — M5416 Radiculopathy, lumbar region: Secondary | ICD-10-CM | POA: Diagnosis not present

## 2019-03-15 DIAGNOSIS — M5416 Radiculopathy, lumbar region: Secondary | ICD-10-CM | POA: Diagnosis not present

## 2019-03-20 DIAGNOSIS — M5416 Radiculopathy, lumbar region: Secondary | ICD-10-CM | POA: Diagnosis not present

## 2019-03-22 ENCOUNTER — Other Ambulatory Visit: Payer: Self-pay | Admitting: Adult Health

## 2019-03-22 DIAGNOSIS — M5416 Radiculopathy, lumbar region: Secondary | ICD-10-CM | POA: Diagnosis not present

## 2019-03-27 DIAGNOSIS — M5416 Radiculopathy, lumbar region: Secondary | ICD-10-CM | POA: Diagnosis not present

## 2019-03-29 DIAGNOSIS — M5416 Radiculopathy, lumbar region: Secondary | ICD-10-CM | POA: Diagnosis not present

## 2019-06-14 ENCOUNTER — Ambulatory Visit: Payer: PPO | Attending: Adult Health

## 2019-06-14 DIAGNOSIS — Z23 Encounter for immunization: Secondary | ICD-10-CM | POA: Insufficient documentation

## 2019-06-14 NOTE — Progress Notes (Signed)
   Covid-19 Vaccination Clinic  Name:  Katelyn [name]    MRN: [id number] DOB: 05-07-1941  06/14/2019  [name] was observed post Covid-19 immunization for 15 minutes without incidence. She was provided with Vaccine Information Sheet and instruction to access the V-Safe system.   [name] was instructed to call 911 with any severe reactions post vaccine: Marland Kitchen Difficulty breathing  . Swelling of your face and throat  . A fast heartbeat  . A bad rash all over your body  . Dizziness and weakness    Immunizations Administered    Name Date Dose VIS Date Route   Pfizer COVID-19 Vaccine 06/14/2019 11:58 AM 0.3 mL 05/05/2019 Intramuscular   Manufacturer: [place]   Lot: [id number]   [place]: [id number]

## 2019-07-05 ENCOUNTER — Ambulatory Visit: Payer: PPO | Attending: Internal Medicine

## 2019-07-05 DIAGNOSIS — Z23 Encounter for immunization: Secondary | ICD-10-CM | POA: Insufficient documentation

## 2019-07-05 NOTE — Progress Notes (Signed)
   Covid-19 Vaccination Clinic  Name:  [name]    MRN: [id number] DOB: 07-19-1940  07/05/2019  Katelyn [name] was observed post Covid-19 immunization for 15 minutes without incidence. She was provided with Vaccine Information Sheet and instruction to access the V-Safe system.   Katelyn [name] was instructed to call 911 with any severe reactions post vaccine: Marland Kitchen Difficulty breathing  . Swelling of your face and throat  . A fast heartbeat  . A bad rash all over your body  . Dizziness and weakness    Immunizations Administered    Name Date Dose VIS Date Route   Pfizer COVID-19 Vaccine 07/05/2019 11:38 AM 0.3 mL 05/05/2019 Intramuscular   Manufacturer: [place]   Lot: [id number]   [place]: [id number]

## 2019-07-28 ENCOUNTER — Other Ambulatory Visit: Payer: Self-pay | Admitting: Physician Assistant

## 2019-07-28 DIAGNOSIS — D485 Neoplasm of uncertain behavior of skin: Secondary | ICD-10-CM | POA: Diagnosis not present

## 2019-07-28 DIAGNOSIS — L570 Actinic keratosis: Secondary | ICD-10-CM | POA: Diagnosis not present

## 2019-07-28 DIAGNOSIS — L821 Other seborrheic keratosis: Secondary | ICD-10-CM | POA: Diagnosis not present

## 2019-07-28 DIAGNOSIS — L820 Inflamed seborrheic keratosis: Secondary | ICD-10-CM | POA: Diagnosis not present

## 2019-08-11 ENCOUNTER — Telehealth: Payer: Self-pay | Admitting: Adult Health

## 2019-08-11 NOTE — Chronic Care Management (AMB) (Signed)
  Chronic Care Management   Note  08/11/2019 Name: Katelyn [name] MRN: [id number] DOB: 1941/05/25  Katelyn [name] is a 79 y.o. year old female who is a primary care patient of Dorothyann Peng, NP. I reached out to [employer] by phone today in response to a referral sent by Ms. [place] PCP, Dorothyann Peng, NP.   Katelyn [name] was given information about Chronic Care Management services today including:  1. CCM service includes personalized support from designated clinical staff supervised by her physician, including individualized plan of care and coordination with other care providers 2. 24/7 contact phone numbers for assistance for urgent and routine care needs. 3. Service will only be billed when office clinical staff spend 20 minutes or more in a month to coordinate care. 4. Only one practitioner may furnish and bill the service in a calendar month. 5. The patient may stop CCM services at any time (effective at the end of the month) by phone call to the office staff.   Patient agreed to services and verbal consent obtained.   Follow up plan:   Raynicia Dukes [employer] Scheduler

## 2019-08-31 ENCOUNTER — Other Ambulatory Visit: Payer: Self-pay

## 2019-08-31 ENCOUNTER — Ambulatory Visit: Payer: PPO

## 2019-08-31 DIAGNOSIS — M899 Disorder of bone, unspecified: Secondary | ICD-10-CM

## 2019-08-31 DIAGNOSIS — I2510 Atherosclerotic heart disease of native coronary artery without angina pectoris: Secondary | ICD-10-CM

## 2019-08-31 DIAGNOSIS — I10 Essential (primary) hypertension: Secondary | ICD-10-CM

## 2019-08-31 DIAGNOSIS — E785 Hyperlipidemia, unspecified: Secondary | ICD-10-CM

## 2019-08-31 DIAGNOSIS — M949 Disorder of cartilage, unspecified: Secondary | ICD-10-CM

## 2019-08-31 NOTE — Patient Instructions (Addendum)
Visit Information  Goals Addressed            This Visit's Progress   . Pharmacy Care Plan       CARE PLAN ENTRY  Current Barriers:  . Chronic Disease Management support, education, and care coordination needs related to CAD, HTN, HLD, and Osteopenia  Pharmacist Clinical Goal(s):  Marland Kitchen Over next 6 months,  . Blood pressure:  Marland Kitchen Maintain blood pressure within goal of your provider (130/80 or 140/90)  . Maintain low salt diet.  . Continue self health monitoring activities as directed today (blood pressure monitoring). . High cholesterol: - Cholesterol goals: Total Cholesterol goal under 200, Triglycerides goal under 150, HDL goal above 40 (men) or above 50 (women), LDL goal under 100.  Lipid Panel     Component Value Date/Time   CHOL 196 11/02/2018 1000   TRIG 147.0 11/02/2018 1000   HDL 52.20 11/02/2018 1000   CHOLHDL 4 11/02/2018 1000   VLDL 29.4 11/02/2018 1000   LDLCALC 114 (H) 11/02/2018 1000 .  Osteopenia - prevent bone fractures.   Interventions: . Comprehensive medication review performed. . Blood pressure:  . Discussed need to continue checking blood pressure at home.  . Discussed diet modifications. DASH diet:  following a diet emphasizing fruits and vegetables and low-fat dairy products along with whole grains, fish, poultry, and nuts. Reducing red meats and sugars.  . Exercising-   . Reducing the amount of salt intake to 1500mg /per day.  . Recommend using a salt substitute to replace your salt if you need flavor.    . Getting enough potassium in your diet equaling 3500-5000mg /day.  This helps to regulate BP by balancing out the effects of salt.   . Weight reduction- We discussed losing 5-10% of body weight . Continue:  - atenolol 50mg , 0.5 tablets (25mg ) once daily  - hydrochlorothiazide 12.5mg , 1 tablet once daily  . High cholesterol/ CAD . How to reduce cholesterol through diet/weight management and physical activity.    . We discussed how a diet high in  plant sterols (fruits/vegetables/nuts/whole grains/legumes) may reduce your cholesterol.  Encouraged increasing fiber to a daily intake of 10-25g/day  . Continue:  - pravastatin 40mg , 1 tablet once daily at bedtime  - aspirin 81mg , 1 tablet once daily  . Osteopenia (disorder of bone and cartilage)  . Continue - calcium gummies 500mg , 2 gummies once daily  - cholecalciferol (vitamin D3) 2000 units, 1 tablet once daily   - estradiol (Vivelle-Dot) 0.05mg /24 hour patch, 1 patch twice weekly  Patient Self Care Activities:  . Patient verbalizes understanding of plan to continue home blood pressure readings, Self administers medications as prescribed, Calls pharmacy for medication refills, and Calls provider office for new concerns or questions . Pick up potassium chloride 7mEq tablets when done taking current supply of potassium 20mEq.   Initial goal documentation        Katelyn [name] was given information about Chronic Care Management services today including:  1. CCM service includes personalized support from designated clinical staff supervised by her physician, including individualized plan of care and coordination with other care providers 2. 24/7 contact phone numbers for assistance for urgent and routine care needs. 3. Standard insurance, coinsurance, copays and deductibles apply for chronic care management only during months in which we provide at least 20 minutes of these services. Most insurances cover these services at 100%, however patients may be responsible for any copay, coinsurance and/or deductible if applicable. This service may help you avoid the  need for more expensive face-to-face services. 4. Only one practitioner may furnish and bill the service in a calendar month. 5. The patient may stop CCM services at any time (effective at the end of the month) by phone call to the office staff.  Patient agreed to services and verbal consent obtained.   The patient verbalized  understanding of instructions provided today and agreed to receive a mailed copy of patient instruction and/or educational materials. Telephone follow up appointment with pharmacy team member scheduled for: 08/31/2019  Katelyn [name], PharmD Clinical Pharmacist [facility] [place] [phone]   [place] stands for "Dietary Approaches to Stop Hypertension." The DASH eating plan is a healthy eating plan that has been shown to reduce high blood pressure (hypertension). It may also reduce your risk for type 2 diabetes, heart disease, and stroke. The DASH eating plan may also help with weight loss. What are tips for following this plan?  General guidelines  Avoid eating more than 2,300 mg (milligrams) of salt (sodium) a day. If you have hypertension, you may need to reduce your sodium intake to 1,500 mg a day.  Limit alcohol intake to no more than 1 drink a day for nonpregnant women and 2 drinks a day for men. One drink equals 12 oz of beer, 5 oz of wine, or 1 oz of hard liquor.  Work with your health care provider to maintain a healthy body weight or to lose weight. Ask what an ideal weight is for you.  Get at least 30 minutes of exercise that causes your heart to beat faster (aerobic exercise) most days of the week. Activities may include walking, swimming, or biking.  Work with your health care provider or diet and nutrition specialist (dietitian) to adjust your eating plan to your individual calorie needs. Reading food labels   Check food labels for the amount of sodium per serving. Choose foods with less than 5 percent of the Daily Value of sodium. Generally, foods with less than 300 mg of sodium per serving fit into this eating plan.  To find whole grains, look for the word "whole" as the first word in the ingredient list. Shopping  Buy products labeled as "low-sodium" or "no salt added."  Buy fresh foods. Avoid canned foods and premade or  frozen meals. Cooking  Avoid adding salt when cooking. Use salt-free seasonings or herbs instead of table salt or sea salt. Check with your health care provider or pharmacist before using salt substitutes.  Do not fry foods. Cook foods using healthy methods such as baking, boiling, grilling, and broiling instead.  Cook with heart-healthy oils, such as olive, canola, soybean, or sunflower oil. Meal planning  Eat a balanced diet that includes: ? 5 or more servings of fruits and vegetables each day. At each meal, try to fill half of your plate with fruits and vegetables. ? Up to 6-8 servings of whole grains each day. ? Less than 6 oz of lean meat, poultry, or fish each day. A 3-oz serving of meat is about the same size as a deck of cards. One egg equals 1 oz. ? 2 servings of low-fat dairy each day. ? A serving of nuts, seeds, or beans 5 times each week. ? Heart-healthy fats. Healthy fats called Omega-3 fatty acids are found in foods such as flaxseeds and coldwater fish, like sardines, salmon, and mackerel.  Limit how much you eat of the following: ? Canned or prepackaged foods. ? Food that  is high in trans fat, such as fried foods. ? Food that is high in saturated fat, such as fatty meat. ? Sweets, desserts, sugary drinks, and other foods with added sugar. ? Full-fat dairy products.  Do not salt foods before eating.  Try to eat at least 2 vegetarian meals each week.  Eat more home-cooked food and less restaurant, buffet, and fast food.  When eating at a restaurant, ask that your food be prepared with less salt or no salt, if possible. What foods are recommended? The items listed may not be a complete list. Talk with your dietitian about what dietary choices are best for you. Grains Whole-grain or whole-wheat bread. Whole-grain or whole-wheat pasta. Brown rice. [place]. Bulgur. Whole-grain and low-sodium cereals. Pita bread. Low-fat, low-sodium crackers. Whole-wheat flour  tortillas. Vegetables Fresh or frozen vegetables (raw, steamed, roasted, or grilled). Low-sodium or reduced-sodium tomato and vegetable juice. Low-sodium or reduced-sodium tomato sauce and tomato paste. Low-sodium or reduced-sodium canned vegetables. Fruits All fresh, dried, or frozen fruit. Canned fruit in natural juice (without added sugar). Meat and other protein foods Skinless chicken or [place]. Ground chicken or [place]. Pork with fat trimmed off. Fish and seafood. Egg whites. Dried beans, peas, or lentils. Unsalted nuts, nut butters, and seeds. Unsalted canned beans. Lean cuts of beef with fat trimmed off. Low-sodium, lean deli meat. Dairy Low-fat (1%) or fat-free (skim) milk. Fat-free, low-fat, or reduced-fat cheeses. Nonfat, low-sodium ricotta or cottage cheese. Low-fat or nonfat yogurt. Low-fat, low-sodium cheese. Fats and oils Soft margarine without trans fats. Vegetable oil. Low-fat, reduced-fat, or light mayonnaise and salad dressings (reduced-sodium). Canola, safflower, olive, soybean, and sunflower oils. Avocado. Seasoning and other foods Herbs. Spices. Seasoning mixes without salt. Unsalted popcorn and pretzels. Fat-free sweets. What foods are not recommended? The items listed may not be a complete list. Talk with your dietitian about what dietary choices are best for you. Grains Baked goods made with fat, such as croissants, muffins, or some breads. Dry pasta or rice meal packs. Vegetables Creamed or fried vegetables. Vegetables in a cheese sauce. Regular canned vegetables (not low-sodium or reduced-sodium). Regular canned tomato sauce and paste (not low-sodium or reduced-sodium). Regular tomato and vegetable juice (not low-sodium or reduced-sodium). Angie Fava. Olives. Fruits Canned fruit in a light or heavy syrup. Fried fruit. Fruit in cream or butter sauce. Meat and other protein foods Fatty cuts of meat. Ribs. Fried meat. Berniece Salines. Sausage. Bologna and other processed lunch meats.  Salami. Fatback. Hotdogs. Bratwurst. Salted nuts and seeds. Canned beans with added salt. Canned or smoked fish. Whole eggs or egg yolks. Chicken or [place] with skin. Dairy Whole or 2% milk, cream, and half-and-half. Whole or full-fat cream cheese. Whole-fat or sweetened yogurt. Full-fat cheese. Nondairy creamers. Whipped toppings. Processed cheese and cheese spreads. Fats and oils Butter. Stick margarine. Lard. Shortening. Ghee. Bacon fat. Tropical oils, such as coconut, palm kernel, or palm oil. Seasoning and other foods Salted popcorn and pretzels. Onion salt, garlic salt, seasoned salt, table salt, and sea salt. Worcestershire sauce. Tartar sauce. Barbecue sauce. Teriyaki sauce. Soy sauce, including reduced-sodium. Steak sauce. Canned and packaged gravies. Fish sauce. Oyster sauce. Cocktail sauce. Horseradish that you find on the shelf. Ketchup. Mustard. Meat flavorings and tenderizers. Bouillon cubes. Hot sauce and Tabasco sauce. Premade or packaged marinades. Premade or packaged taco seasonings. Relishes. Regular salad dressings. Where to find more information:  National Heart, Lung, and [place]: https://wilson-eaton.com/  American Heart Association: www.heart.org Summary  The DASH eating plan is a healthy  eating plan that has been shown to reduce high blood pressure (hypertension). It may also reduce your risk for type 2 diabetes, heart disease, and stroke.  With the DASH eating plan, you should limit salt (sodium) intake to 2,300 mg a day. If you have hypertension, you may need to reduce your sodium intake to 1,500 mg a day.  When on the DASH eating plan, aim to eat more fresh fruits and vegetables, whole grains, lean proteins, low-fat dairy, and heart-healthy fats.  Work with your health care provider or diet and nutrition specialist (dietitian) to adjust your eating plan to your individual calorie needs. This information is not intended to replace advice given to you by your health  care provider. Make sure you discuss any questions you have with your health care provider. Document Revised: 04/23/2017 Document Reviewed: 05/04/2016 Elsevier Patient Education  2020 [employer].

## 2019-08-31 NOTE — Chronic Care Management (AMB) (Signed)
Chronic Care Management Pharmacy  Name: CESLIE STARCK  MRN: [id number] DOB: Jun 29, 1940  Initial Questions: 1. Have you seen any other providers since your last visit? NA 2. Any changes in your medicines or health? No   Chief Complaint/ HPI Galvin Proffer,  79 y.o. , female presents for their Initial CCM visit with the clinical pharmacist via telephone due to COVID-19 Pandemic.  PCP : Dorothyann Peng, NP  Their chronic conditions include: HTN, HLD/ CAD, GERD, Osteopenia   Office Visits: 11/02/2018- Patient presented for office visit with Dorothyann Peng, NP annual wellness visit. Heart healthy diet and exercise disussed with patient. Labs ordered: CBC, CMP, lipid panel, TSH, vitamin D, 25-hydroxy. Patient advised to take atenolol in the morning and holding HCTZ. Patient to monitor BP and report back in 2 weeks.   Medications: Outpatient Encounter Medications as of 08/31/2019  Medication Sig  . Ascorbic Acid (VITAMIN C) 250 MG CHEW Chew 250 mg by mouth daily. (gummies)  . aspirin 81 MG EC tablet Take 81 mg by mouth daily.    Marland Kitchen atenolol (TENORMIN) 50 MG tablet Take 0.5 tablets (25 mg total) by mouth daily.  . Calcium-Phosphorus-Vitamin D (CALCIUM GUMMIES PO) Take 500 mg by mouth daily.  . Cholecalciferol (VITAMIN D3) 2000 UNITS TABS Take 1 tablet by mouth daily.    . Cyanocobalamin (VITAMIN B-12) 5000 MCG TBDP Take 1 tablet by mouth daily.   Marland Kitchen estradiol (VIVELLE-DOT) 0.05 MG/24HR patch APPLY 1 PATCH TOPICALLY TWICE A WEEK  . hydrochlorothiazide (HYDRODIURIL) 12.5 MG tablet Take 1 tablet by mouth once daily  . LUTEIN PO Take 1 tablet by mouth daily.  . potassium chloride (KLOR-CON) 10 MEQ tablet Take 2 tablets by mouth once daily  . pravastatin (PRAVACHOL) 40 MG tablet Take 1 tablet (40 mg total) by mouth at bedtime.  . Calcium Citrate-Vitamin D (CITRACAL PETITES/VITAMIN D PO) Take 1 tablet by mouth daily.    . [DISCONTINUED] potassium chloride (K-DUR) 10 MEQ tablet Take 2 capsules  daily   No facility-administered encounter medications on file as of 08/31/2019.   Current Diagnosis/Assessment:  Goals Addressed            This Visit's Progress   . Pharmacy Care Plan       CARE PLAN ENTRY  Current Barriers:  . Chronic Disease Management support, education, and care coordination needs related to CAD, HTN, HLD, and Osteopenia  Pharmacist Clinical Goal(s):  Marland Kitchen Over next 6 months,  . Blood pressure:  Marland Kitchen Maintain blood pressure within goal of your provider (130/80 or 140/90)  . Maintain low salt diet.  . Continue self health monitoring activities as directed today (blood pressure monitoring). . High cholesterol: - Cholesterol goals: Total Cholesterol goal under 200, Triglycerides goal under 150, HDL goal above 40 (men) or above 50 (women), LDL goal under 100.  Lipid Panel     Component Value Date/Time   CHOL 196 11/02/2018 1000   TRIG 147.0 11/02/2018 1000   HDL 52.20 11/02/2018 1000   CHOLHDL 4 11/02/2018 1000   VLDL 29.4 11/02/2018 1000   LDLCALC 114 (H) 11/02/2018 1000 .  Osteopenia - prevent bone fractures.   Interventions: . Comprehensive medication review performed. . Blood pressure:  . Discussed need to continue checking blood pressure at home.  . Discussed diet modifications. DASH diet:  following a diet emphasizing fruits and vegetables and low-fat dairy products along with whole grains, fish, poultry, and nuts. Reducing red meats and sugars.  . Exercising-   . Reducing  the amount of salt intake to 1500mg /per day.  . Recommend using a salt substitute to replace your salt if you need flavor.    . Getting enough potassium in your diet equaling 3500-5000mg /day.  This helps to regulate BP by balancing out the effects of salt.   . Weight reduction- We discussed losing 5-10% of body weight . Continue:  - atenolol 50mg , 0.5 tablets (25mg ) once daily  - hydrochlorothiazide 12.5mg , 1 tablet once daily  . High cholesterol/ CAD . How to reduce cholesterol  through diet/weight management and physical activity.    . We discussed how a diet high in plant sterols (fruits/vegetables/nuts/whole grains/legumes) may reduce your cholesterol.  Encouraged increasing fiber to a daily intake of 10-25g/day  . Continue:  - pravastatin 40mg , 1 tablet once daily at bedtime  - aspirin 81mg , 1 tablet once daily  . Osteopenia (disorder of bone and cartilage)  . Continue - calcium gummies 500mg , 2 gummies once daily  - cholecalciferol (vitamin D3) 2000 units, 1 tablet once daily   - estradiol (Vivelle-Dot) 0.05mg /24 hour patch, 1 patch twice weekly  Patient Self Care Activities:  . Patient verbalizes understanding of plan to continue home blood pressure readings, Self administers medications as prescribed, Calls pharmacy for medication refills, and Calls provider office for new concerns or questions . Pick up potassium chloride 29mEq tablets when done taking current supply of potassium 58mEq.   Initial goal documentation       SDOH Interventions     Most Recent Value  SDOH Interventions  SDOH Interventions for the Following Domains  Financial Strain  Financial Strain Interventions  Other (Comment) [Not needed.]      Hypertension  BP today is:  <140/90  Office blood pressures are  BP Readings from Last 3 Encounters:  11/02/18 134/86  10/07/18 110/70  12/22/17 120/70   Patient has failed these meds in the past: none   Patient checks BP at home infrequently  Patient home BP readings are ranging: 120/70 mmHg   Patient is controlled on:  - atenolol 50mg , 0.5 tablets (25mg ) once daily  - hydrochlorothiazide 12.5mg , 1 tablet once daily   We discussed diet and exercise extensively . DASH diet:  following a diet emphasizing fruits and vegetables and low-fat dairy products along with whole grains, fish, poultry, and nuts. Reducing red meats and sugars.  . Patient endorses eating too much sugar.  . She reports gaining 6 to 8 lbs in last year.    Marland Kitchen However, she aims to eat Vegetable/ fruit.   Marland Kitchen Exercising  . Patient states she walks 1.2 miles three times per week.  . Patient reporting looking into going back to [facility].  . Reducing the amount of salt intake to 1500mg /per day.  . Recommend using a salt substitute to replace your salt if you need flavor.    . Getting enough potassium in your diet equaling 3500-5000mg /day.  This helps to regulate BP by balancing out the effects of salt.   . Weight reduction- We discussed losing 5-10% of body weight.    Plan  Denies dizziness/ lightheadedness. Endorses orthostatic hypotension.  Continue current medications and control with diet and exercise   Hyperlipidemia/ CAD   Lipid Panel     Component Value Date/Time   CHOL 196 11/02/2018 1000   TRIG 147.0 11/02/2018 1000   HDL 52.20 11/02/2018 1000   CHOLHDL 4 11/02/2018 1000   VLDL 29.4 11/02/2018 1000   LDLCALC 114 (H) 11/02/2018 1000    The 10-year  ASCVD risk score Mikey Bussing DC Jr., et al., 2013) is: 27.6%   Values used to calculate the score:     Age: 12 years     Sex: Female     Is Non-Hispanic African American: No     Diabetic: No     Tobacco smoker: No     Systolic Blood Pressure: [id number] mmHg     Is BP treated: Yes     HDL Cholesterol: 52.2 mg/dL     Total Cholesterol: 196 mg/dL   Patient has failed these meds in past: atorvastatin (myalgia)   Patient is currently uncontrolled on the following medications:  - pravastatin 40mg , 1 tablet once daily at bedtime  - aspirin 81mg , 1 tablet once daily   We discussed:  diet and exercise extensively . How to reduce cholesterol through diet/weight management and physical activity.    . We discussed how a diet high in plant sterols (fruits/vegetables/nuts/whole grains/legumes) may reduce your cholesterol.   Plan Continue current medications and control with diet and exercise  Osteopenia (disorder of bones and joints)   Patient is currently controlled on the following medications:  -  calcium gummies 500mg , 2 gummies once daily  - cholecalciferol (vitamin D3) 2000 units, 1 tablet once daily   - estradiol (Vivelle-Dot) 0.05mg /24 hour patch, 1 patch twice weekly   Reason for estradiol- patient stated her father had osteoporosis and she is still experiencing hot flashes.   Will be due- bone mass density.   Plan Managed by OB/GYN.  Patient states she has follow up every year for mammogram and each time estradiol is assessed.   Continue current medications  Medication Management  Patient organizes medications: patient uses pill box    Follow up Follow up visit with PharmD in 6 months.     Patient inquired change of potassium tabs since they are big. Patient takes potassium 62mEq, 2 tablets daily. Recommend changing to potassium chloride 31mEq tablet, 1 tablet daily.   Anson Crofts, PharmD Clinical Pharmacist [facility] [place] [phone]

## 2019-09-01 ENCOUNTER — Other Ambulatory Visit: Payer: Self-pay | Admitting: Adult Health

## 2019-09-01 MED ORDER — POTASSIUM CHLORIDE CRYS ER 20 MEQ PO TBCR: 20.0000 meq | EXTENDED_RELEASE_TABLET | Freq: Every day | ORAL | 3 refills | Status: DC

## 2019-09-26 ENCOUNTER — Other Ambulatory Visit: Payer: Self-pay | Admitting: Adult Health

## 2019-09-27 NOTE — Telephone Encounter (Signed)
Sent to the pharmacy by e-scribe. 

## 2019-11-09 ENCOUNTER — Other Ambulatory Visit: Payer: Self-pay | Admitting: Adult Health

## 2019-11-09 DIAGNOSIS — E785 Hyperlipidemia, unspecified: Secondary | ICD-10-CM

## 2019-11-10 NOTE — Telephone Encounter (Signed)
30 DAY SUPPLY SENT TO THE PHARMACY BY E-SCRIBE.  PT IS DUE FOR CPX. 

## 2019-11-13 ENCOUNTER — Ambulatory Visit (INDEPENDENT_AMBULATORY_CARE_PROVIDER_SITE_OTHER): Payer: PPO | Admitting: Family Medicine

## 2019-11-13 ENCOUNTER — Encounter: Payer: Self-pay | Admitting: Family Medicine

## 2019-11-13 VITALS — BP 120/60 | HR 80 | Temp 97.9°F | Wt 131.8 lb

## 2019-11-13 DIAGNOSIS — L299 Pruritus, unspecified: Secondary | ICD-10-CM | POA: Diagnosis not present

## 2019-11-13 MED ORDER — HYDROXYZINE HCL 25 MG PO TABS: 25.0000 mg | ORAL_TABLET | ORAL | 0 refills | Status: DC | PRN

## 2019-11-13 NOTE — Progress Notes (Signed)
   Subjective:    Patient ID: Katelyn [name], female    DOB: 23-Oct-1940, 80 y.o.   MRN: [id number]  HPI Here for 5 days of itching over the trunk, both front and back. No visible rashes. She feels fine in general. No recent changes in soaps, detergents, etc. The only thing she can think of that is different is she has been trying Biotin to help slow her hair thinning. She had been taking 5,000 units daily but 2 weeks ago she increased this to 10,000 units daily.    Review of Systems  Constitutional: Negative.   Respiratory: Negative.   Cardiovascular: Negative.   Skin: Negative for color change and rash.       Objective:   Physical Exam Constitutional:      Appearance: Normal appearance. She is not ill-appearing.  Cardiovascular:     Rate and Rhythm: Normal rate and regular rhythm.     Pulses: Normal pulses.     Heart sounds: Normal heart sounds.  Pulmonary:     Effort: Pulmonary effort is normal.     Breath sounds: Normal breath sounds.  Skin:    Findings: No erythema or rash.     Comments: Skin is totally clear. No jaundice   Neurological:     Mental Status: She is alert.           Assessment & Plan:  Itching, this may be an effect of the higher dose Biotin. She will stop this and try Hydroxyzine as needed. Recheck of no better in one week.  Alysia Penna, MD

## 2019-12-05 ENCOUNTER — Telehealth: Payer: Self-pay | Admitting: Adult Health

## 2019-12-05 NOTE — Telephone Encounter (Signed)
Left message for patient to schedule Annual Wellness Visit.  Please schedule with Nurse Health Advisor Shannon Crews, RN at [facility]  

## 2019-12-06 ENCOUNTER — Other Ambulatory Visit: Payer: Self-pay | Admitting: Adult Health

## 2019-12-06 DIAGNOSIS — E785 Hyperlipidemia, unspecified: Secondary | ICD-10-CM

## 2019-12-06 NOTE — Telephone Encounter (Signed)
Sent to the pharmacy by e-scribe for 90 days.  Pt has upcoming cpx. 

## 2019-12-19 ENCOUNTER — Other Ambulatory Visit: Payer: Self-pay

## 2019-12-20 ENCOUNTER — Other Ambulatory Visit: Payer: Self-pay | Admitting: Adult Health

## 2019-12-20 NOTE — Telephone Encounter (Signed)
SENT TO THE PHARMACY BY E-SCRIBE FOR 90 DAYS.  PT HAS UPCOMING CPX.

## 2020-01-16 ENCOUNTER — Encounter: Payer: Self-pay | Admitting: Adult Health

## 2020-01-16 ENCOUNTER — Other Ambulatory Visit: Payer: Self-pay

## 2020-01-16 ENCOUNTER — Ambulatory Visit (INDEPENDENT_AMBULATORY_CARE_PROVIDER_SITE_OTHER): Payer: PPO | Admitting: Adult Health

## 2020-01-16 VITALS — BP 110/80 | HR 64 | Temp 98.1°F | Ht 60.0 in | Wt 134.0 lb

## 2020-01-16 DIAGNOSIS — Z0000 Encounter for general adult medical examination without abnormal findings: Secondary | ICD-10-CM | POA: Diagnosis not present

## 2020-01-16 DIAGNOSIS — I10 Essential (primary) hypertension: Secondary | ICD-10-CM | POA: Diagnosis not present

## 2020-01-16 DIAGNOSIS — I2510 Atherosclerotic heart disease of native coronary artery without angina pectoris: Secondary | ICD-10-CM

## 2020-01-16 DIAGNOSIS — E785 Hyperlipidemia, unspecified: Secondary | ICD-10-CM | POA: Diagnosis not present

## 2020-01-16 DIAGNOSIS — E559 Vitamin D deficiency, unspecified: Secondary | ICD-10-CM

## 2020-01-16 LAB — COMPLETE METABOLIC PANEL WITH GFR
AG Ratio: 1.7 (calc) (ref 1.0–2.5)
ALT: 19 U/L (ref 6–29)
AST: 17 U/L (ref 10–35)
Albumin: 4.3 g/dL (ref 3.6–5.1)
Alkaline phosphatase (APISO): 56 U/L (ref 37–153)
BUN: 18 mg/dL (ref 7–25)
CO2: 27 mmol/L (ref 20–32)
Calcium: 9.5 mg/dL (ref 8.6–10.4)
Chloride: 104 mmol/L (ref 98–110)
Creat: 0.74 mg/dL (ref 0.60–0.93)
GFR, Est African American: 90 mL/min/{1.73_m2} (ref >=60)
GFR, Est Non African American: 78 mL/min/{1.73_m2} (ref >=60)
Globulin: 2.6 g/dL (calc) (ref 1.9–3.7)
Glucose, Bld: 98 mg/dL (ref 65–99)
Potassium: 4.3 mmol/L (ref 3.5–5.3)
Sodium: 139 mmol/L (ref 135–146)
Total Bilirubin: 0.6 mg/dL (ref 0.2–1.2)
Total Protein: 6.9 g/dL (ref 6.1–8.1)

## 2020-01-16 LAB — CBC WITH DIFFERENTIAL/PLATELET
Absolute Monocytes: 490 cells/uL (ref 200–950)
Basophils Absolute: 37 cells/uL (ref 0–200)
Basophils Relative: 0.6 %
Eosinophils Absolute: 267 cells/uL (ref 15–500)
Eosinophils Relative: 4.3 %
HCT: 44.6 % (ref 35.0–45.0)
Hemoglobin: 14.7 g/dL (ref 11.7–15.5)
Lymphs Abs: 1779 cells/uL (ref 850–3900)
MCH: 30.6 pg (ref 27.0–33.0)
MCHC: 33 g/dL (ref 32.0–36.0)
MCV: 92.9 fL (ref 80.0–100.0)
MPV: 9.4 fL (ref 7.5–12.5)
Monocytes Relative: 7.9 %
Neutro Abs: 3627 cells/uL (ref 1500–7800)
Neutrophils Relative %: 58.5 %
Platelets: 279 10*3/uL (ref 140–400)
RBC: 4.8 10*6/uL (ref 3.80–5.10)
RDW: 12.4 % (ref 11.0–15.0)
Total Lymphocyte: 28.7 %
WBC: 6.2 10*3/uL (ref 3.8–10.8)

## 2020-01-16 LAB — VITAMIN D 25 HYDROXY (VIT D DEFICIENCY, FRACTURES): Vit D, 25-Hydroxy: 45 ng/mL (ref 30–100)

## 2020-01-16 LAB — LIPID PANEL
Cholesterol: 183 mg/dL (ref <200)
HDL: 48 mg/dL — ABNORMAL LOW (ref >=50)
LDL Cholesterol (Calc): 105 mg/dL (calc) — ABNORMAL HIGH
Non-HDL Cholesterol (Calc): 135 mg/dL (calc) — ABNORMAL HIGH (ref <130)
Total CHOL/HDL Ratio: 3.8 (calc) (ref <5.0)
Triglycerides: 178 mg/dL — ABNORMAL HIGH (ref <150)

## 2020-01-16 LAB — TSH: TSH: 1.17 mIU/L (ref 0.40–4.50)

## 2020-01-16 NOTE — Addendum Note (Signed)
Addended by: Marrion Coy on: 01/16/2020 09:36 AM   Modules accepted: Orders

## 2020-01-16 NOTE — Progress Notes (Signed)
Subjective:    Patient ID: Katelyn [name], female    DOB: 09-17-1940, 79 y.o.   MRN: [id number]  HPI Patient presents for yearly preventative medicine examination. He is a pleasant 79 year old female who  has a past medical history of Anxiety state, unspecified, Disorder of bone and cartilage, unspecified, Osteoarthrosis, unspecified whether generalized or localized, unspecified site, Other and unspecified hyperlipidemia, Palpitations, Shortness of breath, and Unspecified essential hypertension.  Essential Hypertension -currently prescribed atenolol 25 mg daily and hydrochlorothiazide 12.5 mg daily.  She does monitor her blood pressure at home and gets readings in the 120s over 70s.  She denies dizziness, lightheadedness, chest pain or shortness of breath. BP Readings from Last 3 Encounters:  01/16/20 110/80  11/13/19 120/60  11/02/18 134/86   Hyperlipidemia/CAD -early prescribed pravastatin 40 mg at bedtime and aspirin 81 mg daily.  She denies myalgia or fatigue.  In the past she has failed atorvastatin which caused myalgia. Lab Results  Component Value Date   CHOL 196 11/02/2018   HDL 52.20 11/02/2018   LDLCALC 114 (H) 11/02/2018   TRIG 147.0 11/02/2018   CHOLHDL 4 11/02/2018   Osteopenia -currently prescribed calcium Gummies 500 mg, 2 Gummies once daily, vitamin D 2000 units daily and estradiol 0.05 mg/24-hour patch, 1 patch twice weekly.  Her hormonal patch is managed by OB/GYN.  All immunizations and health maintenance protocols were reviewed with the patient and needed orders were placed.  Appropriate screening laboratory values were ordered for the patient including screening of hyperlipidemia, renal function and hepatic function.   Medication reconciliation,  past medical history, social history, problem list and allergies were reviewed in detail with the patient  Goals were established with regard to weight loss, exercise, and  diet in compliance with medications  Wt  Readings from Last 3 Encounters:  01/16/20 134 lb (60.8 kg)  11/13/19 131 lb 12.8 oz (59.8 kg)  11/02/18 128 lb (58.1 kg)    No acute issues   Review of Systems  Constitutional: Negative.   HENT: Negative.   Eyes: Negative.   Respiratory: Negative.   Cardiovascular: Negative.   Gastrointestinal: Negative.   Endocrine: Negative.   Genitourinary: Negative.   Musculoskeletal: Negative.   Skin: Negative.   Allergic/Immunologic: Negative.   Neurological: Negative.   Hematological: Negative.   Psychiatric/Behavioral: Negative.    Past Medical History:  Diagnosis Date   Anxiety state, unspecified    Disorder of bone and cartilage, unspecified    Osteoarthrosis, unspecified whether generalized or localized, unspecified site    Other and unspecified hyperlipidemia    Palpitations    Shortness of breath    Unspecified essential hypertension     Social History   Socioeconomic History   Marital status: Married    Spouse name: Not on file   Number of children: Not on file   Years of education: Not on file   Highest education level: Not on file  Occupational History   Not on file  Tobacco Use   Smoking status: Never Smoker   Smokeless tobacco: Never Used  Vaping Use   Vaping Use: Never used  Substance and Sexual Activity   Alcohol use: No   Drug use: No   Sexual activity: Not on file  Other Topics Concern   Not on file  Social History Narrative   Teacher - elementary school    Married    Two children    Three grandchildren       She likes  to read    Social Determinants of Health   Financial Resource Strain: Low Risk    Difficulty of Paying Living Expenses: Not hard at all  Food Insecurity:    Worried About Charity fundraiser in the Last Year: Not on file   [employer] of Food in the Last Year: Not on file  Transportation Needs: No Transportation Needs   Lack of Transportation (Medical): No   Lack of Transportation (Non-Medical): No    Physical Activity:    Days of Exercise per Week: Not on file   Minutes of Exercise per Session: Not on file  Stress:    Feeling of Stress : Not on file  Social Connections:    Frequency of Communication with Friends and Family: Not on file   Frequency of Social Gatherings with Friends and Family: Not on file   Attends Religious Services: Not on file   Active Member of Clubs or Organizations: Not on file   Attends Archivist Meetings: Not on file   Marital Status: Not on file  Intimate Partner Violence:    Fear of Current or Ex-Partner: Not on file   Emotionally Abused: Not on file   Physically Abused: Not on file   Sexually Abused: Not on file    Past Surgical History:  Procedure Laterality Date   BILATERAL SALPINGOOPHORECTOMY  2001   BREAST CYST ASPIRATION  2001   BREAST EXCISIONAL BIOPSY Right 09/12/2015   BREAST LUMPECTOMY WITH RADIOACTIVE SEED LOCALIZATION Right 09/12/2015   Procedure: RIGHT BREAST LUMPECTOMY WITH RADIOACTIVE SEED LOCALIZATION;  Surgeon: Erroll Luna, MD;  Location: [facility];  Service: General;  Laterality: Right;   COLONOSCOPY  [id number]   LEFT HEART CATHETERIZATION WITH CORONARY ANGIOGRAM N/A 06/26/2014   Procedure: [place];  Surgeon: Leonie Man, MD;  Location: [facility] CATH LAB;  Service: Cardiovascular;  Laterality: N/A;   TONSILLECTOMY  1957   TOTAL ABDOMINAL HYSTERECTOMY  2001    Family History  Problem Relation Age of Onset   Pneumonia Mother    Stroke Mother    Asthma Other    Allergies Other    Asthma Father    Colon cancer Neg Hx    Stomach cancer Neg Hx     Allergies  Allergen Reactions   Penicillins     REACTION: rash   Tetracycline     REACTION: rash    Current Outpatient Medications on File Prior to Visit  Medication Sig Dispense Refill   Ascorbic Acid (VITAMIN C) 250 MG CHEW Chew 250 mg by mouth daily. (gummies)     aspirin 81  MG EC tablet Take 81 mg by mouth daily.       atenolol (TENORMIN) 50 MG tablet Take 1/2 (one-half) tablet by mouth once daily 45 tablet 0   Calcium Citrate-Vitamin D (CITRACAL PETITES/VITAMIN D PO) Take 1 tablet by mouth daily.       Calcium-Phosphorus-Vitamin D (CALCIUM GUMMIES PO) Take 500 mg by mouth daily.     Cholecalciferol (VITAMIN D3) 2000 UNITS TABS Take 1 tablet by mouth daily.       Cyanocobalamin (VITAMIN B-12) 5000 MCG TBDP Take 1 tablet by mouth daily.      estradiol (VIVELLE-DOT) 0.05 MG/24HR patch APPLY 1 PATCH TOPICALLY TWICE A WEEK  12   hydrochlorothiazide (HYDRODIURIL) 12.5 MG tablet Take 1 tablet by mouth once daily 90 tablet 2   hydrOXYzine (ATARAX/VISTARIL) 25 MG tablet Take 1 tablet (25 mg total) by  mouth every 4 (four) hours as needed for itching. 60 tablet 0   LUTEIN PO Take 1 tablet by mouth daily.     potassium chloride SA (KLOR-CON) 20 MEQ tablet Take 1 tablet (20 mEq total) by mouth daily. 90 tablet 3   pravastatin (PRAVACHOL) 40 MG tablet TAKE 1 TABLET BY MOUTH AT BEDTIME DUE  FOR  YEARLY  PHYSICAL 90 tablet 0   [DISCONTINUED] potassium chloride (K-DUR) 10 MEQ tablet Take 2 capsules daily 180 tablet 3   No current facility-administered medications on file prior to visit.    BP 110/80 (BP Location: Left Arm, Patient Position: Sitting, Cuff Size: Large)    Pulse 64    Temp 98.1 F (36.7 C) (Oral)    Ht 5' (1.524 m)    Wt 134 lb (60.8 kg)    SpO2 97%    BMI 26.17 kg/m       Objective:   Physical Exam Vitals and nursing note reviewed.  Constitutional:      General: She is not in acute distress.    Appearance: Normal appearance. She is well-developed. She is not ill-appearing.  HENT:     Head: Normocephalic and atraumatic.     Right Ear: Tympanic membrane, ear canal and external ear normal. There is no impacted cerumen.     Left Ear: Tympanic membrane, ear canal and external ear normal. There is no impacted cerumen.     Nose: Nose normal. No  congestion or rhinorrhea.     Mouth/Throat:     Mouth: Mucous membranes are moist.     Pharynx: Oropharynx is clear. No oropharyngeal exudate or posterior oropharyngeal erythema.  Eyes:     General:        Right eye: No discharge.        Left eye: No discharge.     Extraocular Movements: Extraocular movements intact.     Conjunctiva/sclera: Conjunctivae normal.     Pupils: Pupils are equal, round, and reactive to light.  Neck:     Thyroid: No thyromegaly.     Vascular: No carotid bruit.     Trachea: No tracheal deviation.  Cardiovascular:     Rate and Rhythm: Normal rate and regular rhythm.     Pulses: Normal pulses.     Heart sounds: Normal heart sounds. No murmur heard.  No friction rub. No gallop.   Pulmonary:     Effort: Pulmonary effort is normal. No respiratory distress.     Breath sounds: Normal breath sounds. No stridor. No wheezing, rhonchi or rales.  Chest:     Chest wall: No tenderness.  Abdominal:     General: Abdomen is flat. Bowel sounds are normal. There is no distension.     Palpations: Abdomen is soft. There is no mass.     Tenderness: There is no abdominal tenderness. There is no right CVA tenderness, left CVA tenderness, guarding or rebound.     Hernia: No hernia is present.  Musculoskeletal:        General: No swelling, tenderness, deformity or signs of injury. Normal range of motion.     Cervical back: Normal range of motion and neck supple.     Right lower leg: No edema.     Left lower leg: No edema.  Lymphadenopathy:     Cervical: No cervical adenopathy.  Skin:    General: Skin is warm and dry.     Coloration: Skin is not jaundiced or pale.     Findings: No bruising, erythema,  lesion or rash.  Neurological:     General: No focal deficit present.     Mental Status: She is alert and oriented to person, place, and time.     Cranial Nerves: No cranial nerve deficit.     Sensory: No sensory deficit.     Motor: No weakness.     Coordination:  Coordination normal.     Gait: Gait normal.     Deep Tendon Reflexes: Reflexes normal.  Psychiatric:        Mood and Affect: Mood normal.        Behavior: Behavior normal.        Thought Content: Thought content normal.        Judgment: Judgment normal.       Assessment & Plan:  1. Routine general medical examination at a health care facility - Continue to work on lifestyle modifications  - Follow up in one year or sooner if needed - CBC with Differential/Platelet; Future - Lipid panel; Future - TSH; Future - CMP with eGFR([facility]); Future - Vitamin D, 25-hydroxy; Future  2. Vitamin D deficiency  - CBC with Differential/Platelet; Future - Lipid panel; Future - TSH; Future - CMP with eGFR([facility]); Future - Vitamin D, 25-hydroxy; Future  3. Essential hypertension - BP well controlled. No change in medications  - CBC with Differential/Platelet; Future - Lipid panel; Future - TSH; Future - CMP with eGFR([facility]); Future - Vitamin D, 25-hydroxy; Future  4. Dyslipidemia - Consider increase in statin  - CBC with Differential/Platelet; Future - Lipid panel; Future - TSH; Future - CMP with eGFR([facility]); Future - Vitamin D, 25-hydroxy; Future  5. Coronary artery disease involving native coronary artery of native heart without angina pectoris - Consider increase in statin  - CBC with Differential/Platelet; Future - Lipid panel; Future - TSH; Future - CMP with eGFR([facility]); Future - Vitamin D, 25-hydroxy; Future   Dorothyann Peng, NP

## 2020-01-18 DIAGNOSIS — Z6825 Body mass index (BMI) 25.0-25.9, adult: Secondary | ICD-10-CM | POA: Diagnosis not present

## 2020-01-18 DIAGNOSIS — Z79890 Hormone replacement therapy (postmenopausal): Secondary | ICD-10-CM | POA: Diagnosis not present

## 2020-01-18 DIAGNOSIS — N951 Menopausal and female climacteric states: Secondary | ICD-10-CM | POA: Diagnosis not present

## 2020-01-18 DIAGNOSIS — Z1231 Encounter for screening mammogram for malignant neoplasm of breast: Secondary | ICD-10-CM | POA: Diagnosis not present

## 2020-01-18 DIAGNOSIS — Z01419 Encounter for gynecological examination (general) (routine) without abnormal findings: Secondary | ICD-10-CM | POA: Diagnosis not present

## 2020-02-09 ENCOUNTER — Other Ambulatory Visit: Payer: Self-pay | Admitting: Adult Health

## 2020-02-28 ENCOUNTER — Telehealth: Payer: PPO

## 2020-03-08 ENCOUNTER — Other Ambulatory Visit: Payer: Self-pay | Admitting: Adult Health

## 2020-03-08 DIAGNOSIS — E785 Hyperlipidemia, unspecified: Secondary | ICD-10-CM

## 2020-03-11 ENCOUNTER — Telehealth: Payer: Self-pay

## 2020-03-11 NOTE — Telephone Encounter (Signed)
Patient wants to get tetanus injection while she's here getting flu injection on 04/11/20 if possible   Please advise

## 2020-03-12 NOTE — Telephone Encounter (Signed)
I left a detailed message at the pts home number stating unfortunately anyone age 78 and over has to receive the tetanus vaccine at a pharmacy as insurance does not cover this here.

## 2020-03-19 ENCOUNTER — Other Ambulatory Visit: Payer: Self-pay | Admitting: Adult Health

## 2020-03-25 ENCOUNTER — Telehealth: Payer: Self-pay | Admitting: Adult Health

## 2020-03-25 NOTE — Telephone Encounter (Signed)
Left message for patient to call back and schedule Medicare Annual Wellness Visit (AWV) either virtually or in office.  Last AWV 08/11/17; please schedule at anytime with [facility] Nurse Health Advisor 1.  This should be a 45 minute visit.

## 2020-04-11 ENCOUNTER — Other Ambulatory Visit: Payer: Self-pay

## 2020-04-11 ENCOUNTER — Ambulatory Visit (INDEPENDENT_AMBULATORY_CARE_PROVIDER_SITE_OTHER): Payer: PPO | Admitting: *Deleted

## 2020-04-11 DIAGNOSIS — Z23 Encounter for immunization: Secondary | ICD-10-CM | POA: Diagnosis not present

## 2020-05-08 ENCOUNTER — Other Ambulatory Visit: Payer: Self-pay | Admitting: Adult Health

## 2020-06-07 ENCOUNTER — Other Ambulatory Visit: Payer: Self-pay | Admitting: Adult Health

## 2020-06-07 DIAGNOSIS — E785 Hyperlipidemia, unspecified: Secondary | ICD-10-CM

## 2020-06-07 NOTE — Telephone Encounter (Signed)
Sent to the pharmacy by e-scribe. 

## 2020-06-14 ENCOUNTER — Other Ambulatory Visit: Payer: Self-pay | Admitting: Adult Health

## 2020-06-14 NOTE — Telephone Encounter (Signed)
Sent to the pharmacy by e-scribe. 

## 2020-07-03 ENCOUNTER — Telehealth: Payer: Self-pay | Admitting: Adult Health

## 2020-07-03 NOTE — Telephone Encounter (Signed)
Left message for patient to call back and schedule Medicare Annual Wellness Visit (AWV) either virtually or in office. Didn't leave detailed message   Last AWV 08/11/17  please schedule at anytime with [facility] Nurse Health Advisor 1 or 2   This should be a 45 minute visit.

## 2020-09-05 ENCOUNTER — Telehealth: Payer: Self-pay | Admitting: Adult Health

## 2020-09-05 NOTE — Telephone Encounter (Signed)
Left message for patient to call back and schedule Medicare Annual Wellness Visit (AWV) either virtually or in office.   Last AWV 07/2017  please schedule at anytime with [facility] Nurse Health Advisor 1 or 2   This should be a 45 minute visit.

## 2020-09-11 ENCOUNTER — Other Ambulatory Visit: Payer: Self-pay | Admitting: Adult Health

## 2020-12-05 ENCOUNTER — Other Ambulatory Visit: Payer: Self-pay | Admitting: Adult Health

## 2020-12-05 DIAGNOSIS — E785 Hyperlipidemia, unspecified: Secondary | ICD-10-CM

## 2020-12-22 DIAGNOSIS — J029 Acute pharyngitis, unspecified: Secondary | ICD-10-CM | POA: Diagnosis not present

## 2020-12-22 DIAGNOSIS — Z20822 Contact with and (suspected) exposure to covid-19: Secondary | ICD-10-CM | POA: Diagnosis not present

## 2020-12-22 DIAGNOSIS — R0981 Nasal congestion: Secondary | ICD-10-CM | POA: Diagnosis not present

## 2021-01-08 ENCOUNTER — Ambulatory Visit: Payer: PPO | Admitting: Physician Assistant

## 2021-01-08 ENCOUNTER — Encounter: Payer: Self-pay | Admitting: Physician Assistant

## 2021-01-08 ENCOUNTER — Other Ambulatory Visit: Payer: Self-pay

## 2021-01-08 DIAGNOSIS — L988 Other specified disorders of the skin and subcutaneous tissue: Secondary | ICD-10-CM | POA: Diagnosis not present

## 2021-01-08 DIAGNOSIS — L821 Other seborrheic keratosis: Secondary | ICD-10-CM | POA: Diagnosis not present

## 2021-01-08 DIAGNOSIS — Z1283 Encounter for screening for malignant neoplasm of skin: Secondary | ICD-10-CM

## 2021-01-08 MED ORDER — TRETINOIN 0.05 % EX CREA: TOPICAL_CREAM | Freq: Every day | CUTANEOUS | 11 refills | Status: AC

## 2021-01-08 NOTE — Progress Notes (Signed)
   Follow-Up Visit   Subjective  Katelyn [name] is a 80 y.o. female who presents for the following: Annual Exam (Left cheek lesion that just wont go away x months if she picks it it will bleed. Patient has history of bcc and atypical moles).   The following portions of the chart were reviewed this encounter and updated as appropriate:  Tobacco  Allergies  Meds  Problems  Med Hx  Surg Hx  Fam Hx      Objective  Well appearing patient in no apparent distress; mood and affect are within normal limits.  A full examination was performed including scalp, head, eyes, ears, nose, lips, neck, chest, axillae, abdomen, back, buttocks, bilateral upper extremities, bilateral lower extremities, hands, feet, fingers, toes, fingernails, and toenails. All findings within normal limits unless otherwise noted below.  Head to Toe No atypical nevi or signs of NMSC noted at the time of the visit.   Left Malar Cheek Stuck-on, crusted plaque.  Head - Anterior (Face) Mild lines  Assessment & Plan  Skin exam for malignant neoplasm Head to Toe  Yearly skin check  Seborrheic keratosis Left Malar Cheek  Benign okay to leave if stable.  [place] Head - Anterior (Face)  tretinoin (RETIN-A) 0.05 % cream - Head - Anterior (Face) Apply topically at bedtime.   I, Cashus Halterman, PA-C, have reviewed all documentation's for this visit.  The documentation on 01/08/21 for the exam, diagnosis, procedures and orders are all accurate and complete.

## 2021-01-16 ENCOUNTER — Other Ambulatory Visit: Payer: Self-pay

## 2021-01-16 ENCOUNTER — Encounter: Payer: Self-pay | Admitting: Adult Health

## 2021-01-16 ENCOUNTER — Ambulatory Visit (INDEPENDENT_AMBULATORY_CARE_PROVIDER_SITE_OTHER): Payer: PPO | Admitting: Adult Health

## 2021-01-16 VITALS — BP 120/80 | HR 66 | Temp 98.6°F | Ht 60.25 in | Wt 132.0 lb

## 2021-01-16 DIAGNOSIS — E785 Hyperlipidemia, unspecified: Secondary | ICD-10-CM

## 2021-01-16 DIAGNOSIS — Z0000 Encounter for general adult medical examination without abnormal findings: Secondary | ICD-10-CM | POA: Diagnosis not present

## 2021-01-16 DIAGNOSIS — M8589 Other specified disorders of bone density and structure, multiple sites: Secondary | ICD-10-CM

## 2021-01-16 DIAGNOSIS — I10 Essential (primary) hypertension: Secondary | ICD-10-CM

## 2021-01-16 DIAGNOSIS — Z1159 Encounter for screening for other viral diseases: Secondary | ICD-10-CM | POA: Diagnosis not present

## 2021-01-16 DIAGNOSIS — I2510 Atherosclerotic heart disease of native coronary artery without angina pectoris: Secondary | ICD-10-CM

## 2021-01-16 LAB — LIPID PANEL
Cholesterol: 171 mg/dL (ref 0–200)
HDL: 46.1 mg/dL (ref >39.00)
LDL Cholesterol: 96 mg/dL (ref 0–99)
NonHDL: 125.09
Total CHOL/HDL Ratio: 4
Triglycerides: 145 mg/dL (ref 0.0–149.0)
VLDL: 29 mg/dL (ref 0.0–40.0)

## 2021-01-16 LAB — CBC WITH DIFFERENTIAL/PLATELET
Basophils Absolute: 0 10*3/uL (ref 0.0–0.1)
Basophils Relative: 0.6 % (ref 0.0–3.0)
Eosinophils Absolute: 0.2 10*3/uL (ref 0.0–0.7)
Eosinophils Relative: 3.8 % (ref 0.0–5.0)
HCT: 40.8 % (ref 36.0–46.0)
Hemoglobin: 14 g/dL (ref 12.0–15.0)
Lymphocytes Relative: 28.2 % (ref 12.0–46.0)
Lymphs Abs: 1.7 10*3/uL (ref 0.7–4.0)
MCHC: 34.3 g/dL (ref 30.0–36.0)
MCV: 89.9 fl (ref 78.0–100.0)
Monocytes Absolute: 0.5 10*3/uL (ref 0.1–1.0)
Monocytes Relative: 8.3 % (ref 3.0–12.0)
Neutro Abs: 3.6 10*3/uL (ref 1.4–7.7)
Neutrophils Relative %: 59.1 % (ref 43.0–77.0)
Platelets: 280 10*3/uL (ref 150.0–400.0)
RBC: 4.53 Mil/uL (ref 3.87–5.11)
RDW: 13.1 % (ref 11.5–15.5)
WBC: 6.2 10*3/uL (ref 4.0–10.5)

## 2021-01-16 LAB — COMPREHENSIVE METABOLIC PANEL
ALT: 19 U/L (ref 0–35)
AST: 21 U/L (ref 0–37)
Albumin: 4.1 g/dL (ref 3.5–5.2)
Alkaline Phosphatase: 61 U/L (ref 39–117)
BUN: 14 mg/dL (ref 6–23)
CO2: 28 mEq/L (ref 19–32)
Calcium: 9.5 mg/dL (ref 8.4–10.5)
Chloride: 103 mEq/L (ref 96–112)
Creatinine, Ser: 0.74 mg/dL (ref 0.40–1.20)
GFR: 77.16 mL/min (ref >60.00)
Glucose, Bld: 97 mg/dL (ref 70–99)
Potassium: 3.9 mEq/L (ref 3.5–5.1)
Sodium: 139 mEq/L (ref 135–145)
Total Bilirubin: 0.6 mg/dL (ref 0.2–1.2)
Total Protein: 7.5 g/dL (ref 6.0–8.3)

## 2021-01-16 LAB — TSH: TSH: 1.3 u[IU]/mL (ref 0.35–5.50)

## 2021-01-16 NOTE — Addendum Note (Signed)
Addended by: Elmer Picker on: 01/16/2021 09:31 AM   Modules accepted: Orders

## 2021-01-16 NOTE — Patient Instructions (Signed)
It was great seeing you today   We will follow up with you regarding your blood work   I will see you back in one year or sooner if needed 

## 2021-01-16 NOTE — Progress Notes (Signed)
Subjective:    Patient ID: Katelyn [name], female    DOB: November 01, 1940, 80 y.o.   MRN: [id number]  HPI Patient presents for yearly preventative medicine examination. She is a pleasant 80 year old female who  has a past medical history of Anxiety state, unspecified, Atypical mole (2015), Basal cell carcinoma (10/17/2013), Disorder of bone and cartilage, unspecified, Osteoarthrosis, unspecified whether generalized or localized, unspecified site, Other and unspecified hyperlipidemia, Palpitations, Shortness of breath, and Unspecified essential hypertension.  Hypertension-prescribed atenolol 25 mg daily and hydrochlorothiazide 12.5 mg daily.  She does monitor her blood pressure at home and gets readings in the 120s over 70s.  She denies dizziness, lightheadedness, chest pain, or shortness of breath BP Readings from Last 3 Encounters:  01/16/21 120/80  01/16/20 110/80  11/13/19 120/60   Hyperlipidemia/CAD-prescribed simvastatin 40 mg at bedtime and aspirin 81 mg daily.  She denies myalgia or fatigue.  In the past she has failed atorvastatin which cause myalgia Lab Results  Component Value Date   CHOL 183 01/16/2020   HDL 48 (L) 01/16/2020   LDLCALC 105 (H) 01/16/2020   TRIG 178 (H) 01/16/2020   CHOLHDL 3.8 01/16/2020   Osteopenia -currently takes calcium Gummies 500 mg, 2 Gummies once a day.  Also takes vitamin D 2000 units daily and estradiol 0.05 mg/24-hour patch, 1 patch twice weekly.  Hormonal patches managed by GYN.  All immunizations and health maintenance protocols were reviewed with the patient and needed orders were placed.  Appropriate screening laboratory values were ordered for the patient including screening of hyperlipidemia, renal function and hepatic function.  Medication reconciliation,  past medical history, social history, problem list and allergies were reviewed in detail with the patient  Goals were established with regard to weight loss, exercise, and  diet in  compliance with medications. She is exercising at the [facility] on a routine basis.   Wt Readings from Last 3 Encounters:  01/16/21 132 lb (59.9 kg)  01/16/20 134 lb (60.8 kg)  11/13/19 131 lb 12.8 oz (59.8 kg)   No acute issues   Review of Systems  Constitutional: Negative.   HENT: Negative.    Eyes: Negative.   Respiratory: Negative.    Cardiovascular: Negative.   Gastrointestinal: Negative.   Endocrine: Negative.   Genitourinary: Negative.   Musculoskeletal: Negative.   Skin: Negative.   Allergic/Immunologic: Negative.   Neurological: Negative.   Hematological: Negative.   Psychiatric/Behavioral: Negative.     Past Medical History:  Diagnosis Date   Anxiety state, unspecified    Atypical mole 2015   left ant thigh moderate   Basal cell carcinoma 10/17/2013   right side of neck tx with bx   Disorder of bone and cartilage, unspecified    Osteoarthrosis, unspecified whether generalized or localized, unspecified site    Other and unspecified hyperlipidemia    Palpitations    Shortness of breath    Unspecified essential hypertension     Social History   Socioeconomic History   Marital status: Married    Spouse name: Not on file   Number of children: Not on file   Years of education: Not on file   Highest education level: Not on file  Occupational History   Not on file  Tobacco Use   Smoking status: Never   Smokeless tobacco: Never  Vaping Use   Vaping Use: Never used  Substance and Sexual Activity   Alcohol use: No   Drug use: No   Sexual activity: Not on file  Other Topics Concern   Not on file  Social History Narrative   Teacher - elementary school    Married    Two children    Three grandchildren       She likes to read    Social Determinants of Health   Financial Resource Strain: Not on file  Food Insecurity: Not on file  Transportation Needs: Not on file  Physical Activity: Not on file  Stress: Not on file  Social Connections: Not on file   Intimate Partner Violence: Not on file    Past Surgical History:  Procedure Laterality Date   BILATERAL SALPINGOOPHORECTOMY  2001   BREAST CYST ASPIRATION  2001   BREAST EXCISIONAL BIOPSY Right 09/12/2015   BREAST LUMPECTOMY WITH RADIOACTIVE SEED LOCALIZATION Right 09/12/2015   Procedure: RIGHT BREAST LUMPECTOMY WITH RADIOACTIVE SEED LOCALIZATION;  Surgeon: Erroll Luna, MD;  Location: [facility];  Service: General;  Laterality: Right;   COLONOSCOPY  [id number]   LEFT HEART CATHETERIZATION WITH CORONARY ANGIOGRAM N/A 06/26/2014   Procedure: LEFT HEART CATHETERIZATION WITH CORONARY ANGIOGRAM;  Surgeon: Leonie Man, MD;  Location: [facility] CATH LAB;  Service: Cardiovascular;  Laterality: N/A;   TONSILLECTOMY  1957   TOTAL ABDOMINAL HYSTERECTOMY  2001    Family History  Problem Relation Age of Onset   Pneumonia Mother    Stroke Mother    Asthma Other    Allergies Other    Asthma Father    Colon cancer Neg Hx    Stomach cancer Neg Hx     Allergies  Allergen Reactions   Penicillins     REACTION: rash   Tetracycline     REACTION: rash    Current Outpatient Medications on File Prior to Visit  Medication Sig Dispense Refill   Ascorbic Acid (VITAMIN C) 250 MG CHEW Chew 250 mg by mouth daily. (gummies)     aspirin 81 MG EC tablet Take 81 mg by mouth daily.       atenolol (TENORMIN) 50 MG tablet Take 1/2 (one-half) tablet by mouth once daily 45 tablet 0   Calcium-Phosphorus-Vitamin D (CALCIUM GUMMIES PO) Take 500 mg by mouth daily.     Cholecalciferol (VITAMIN D3) 2000 UNITS TABS Take 1 tablet by mouth daily.       Cyanocobalamin (VITAMIN B-12) 5000 MCG TBDP Take 1 tablet by mouth daily.      estradiol (VIVELLE-DOT) 0.05 MG/24HR patch APPLY 1 PATCH TOPICALLY TWICE A WEEK  12   hydrochlorothiazide (HYDRODIURIL) 12.5 MG tablet Take 1 tablet by mouth once daily 90 tablet 3   LUTEIN PO Take 1 tablet by mouth daily.     Multiple Vitamins-Minerals (ICAPS AREDS 2 PO) Take by  mouth.     potassium chloride SA (KLOR-CON) 20 MEQ tablet Take 1 tablet by mouth once daily 90 tablet 0   pravastatin (PRAVACHOL) 40 MG tablet TAKE 1 TABLET BY MOUTH AT BEDTIME 90 tablet 0   tretinoin (RETIN-A) 0.05 % cream Apply topically at bedtime. 45 g 11   Influenza vac split quadrivalent PF (FLUZONE HIGH-DOSE) 0.5 ML injection Fluzone High-Dose 2019-20 (PF) 180 mcg/0.5 mL intramuscular syringe  PHARMACIST ADMINISTERED IMMUNIZATION ADMINISTERED AT TIME OF DISPENSING (Patient not taking: Reported on 01/16/2021)     No current facility-administered medications on file prior to visit.    BP 120/80   Pulse 66   Temp 98.6 F (37 C) (Oral)   Ht 5' 0.25" (1.53 m)   Wt 132 lb (59.9 kg)   SpO2  98%   BMI 25.57 kg/m        Objective:   Physical Exam Vitals and nursing note reviewed.  Constitutional:      General: She is not in acute distress.    Appearance: Normal appearance. She is well-developed. She is not ill-appearing.  HENT:     Head: Normocephalic and atraumatic.     Right Ear: Tympanic membrane, ear canal and external ear normal. There is no impacted cerumen.     Left Ear: Tympanic membrane, ear canal and external ear normal. There is no impacted cerumen.     Nose: Nose normal. No congestion or rhinorrhea.     Mouth/Throat:     Mouth: Mucous membranes are moist.     Pharynx: Oropharynx is clear. No oropharyngeal exudate or posterior oropharyngeal erythema.  Eyes:     General:        Right eye: No discharge.        Left eye: No discharge.     Extraocular Movements: Extraocular movements intact.     Conjunctiva/sclera: Conjunctivae normal.     Pupils: Pupils are equal, round, and reactive to light.  Neck:     Thyroid: No thyromegaly.     Vascular: No carotid bruit.     Trachea: No tracheal deviation.  Cardiovascular:     Rate and Rhythm: Normal rate and regular rhythm.     Pulses: Normal pulses.     Heart sounds: Normal heart sounds. No murmur heard.   No friction  rub. No gallop.  Pulmonary:     Effort: Pulmonary effort is normal. No respiratory distress.     Breath sounds: Normal breath sounds. No stridor. No wheezing, rhonchi or rales.  Chest:     Chest wall: No tenderness.  Abdominal:     General: Abdomen is flat. Bowel sounds are normal. There is no distension.     Palpations: Abdomen is soft. There is no mass.     Tenderness: There is no abdominal tenderness. There is no right CVA tenderness, left CVA tenderness, guarding or rebound.     Hernia: No hernia is present.  Musculoskeletal:        General: No swelling, tenderness, deformity or signs of injury. Normal range of motion.     Cervical back: Normal range of motion and neck supple.     Right lower leg: No edema.     Left lower leg: No edema.  Lymphadenopathy:     Cervical: No cervical adenopathy.  Skin:    General: Skin is warm and dry.     Coloration: Skin is not jaundiced or pale.     Findings: No bruising, erythema, lesion or rash.  Neurological:     General: No focal deficit present.     Mental Status: She is alert and oriented to person, place, and time.     Cranial Nerves: No cranial nerve deficit.     Sensory: No sensory deficit.     Motor: No weakness.     Coordination: Coordination normal.     Gait: Gait normal.     Deep Tendon Reflexes: Reflexes normal.  Psychiatric:        Mood and Affect: Mood normal.        Behavior: Behavior normal.        Thought Content: Thought content normal.        Judgment: Judgment normal.      Assessment & Plan:   1. Routine general medical examination at a health care facility -  Follow up in one year or sooner if needed - CBC with Differential/Platelet; Future - Comprehensive metabolic panel; Future - Lipid panel; Future - TSH; Future  2. Essential hypertension - Well controlled.  - No change in medications  - CBC with Differential/Platelet; Future - Comprehensive metabolic panel; Future - Lipid panel; Future - TSH;  Future  3. Dyslipidemia - Consider increase in statin  - CBC with Differential/Platelet; Future - Comprehensive metabolic panel; Future - Lipid panel; Future - TSH; Future  4. Coronary artery disease involving native coronary artery of native heart without angina pectoris - Continue statin and asa - CBC with Differential/Platelet; Future - Comprehensive metabolic panel; Future - Lipid panel; Future - TSH; Future  5. Osteopenia of multiple sites - Continue vitamin D and Calcium supplementation   6. Need for hepatitis C screening test  - Hep C Antibody; Future  Dorothyann Peng, NP

## 2021-01-17 LAB — HEPATITIS C ANTIBODY
Hepatitis C Ab: NONREACTIVE
SIGNAL TO CUT-OFF: 0.05 (ref <1.00)

## 2021-02-17 DIAGNOSIS — Z1231 Encounter for screening mammogram for malignant neoplasm of breast: Secondary | ICD-10-CM | POA: Diagnosis not present

## 2021-03-01 DIAGNOSIS — N3000 Acute cystitis without hematuria: Secondary | ICD-10-CM | POA: Diagnosis not present

## 2021-03-01 DIAGNOSIS — R399 Unspecified symptoms and signs involving the genitourinary system: Secondary | ICD-10-CM | POA: Diagnosis not present

## 2021-03-01 DIAGNOSIS — R300 Dysuria: Secondary | ICD-10-CM | POA: Diagnosis not present

## 2021-03-01 DIAGNOSIS — R350 Frequency of micturition: Secondary | ICD-10-CM | POA: Diagnosis not present

## 2021-03-06 ENCOUNTER — Other Ambulatory Visit: Payer: Self-pay | Admitting: Adult Health

## 2021-03-06 DIAGNOSIS — E785 Hyperlipidemia, unspecified: Secondary | ICD-10-CM

## 2021-03-18 ENCOUNTER — Other Ambulatory Visit: Payer: Self-pay | Admitting: Adult Health

## 2021-03-20 ENCOUNTER — Other Ambulatory Visit: Payer: Self-pay | Admitting: Adult Health

## 2021-05-02 DIAGNOSIS — H43393 Other vitreous opacities, bilateral: Secondary | ICD-10-CM | POA: Diagnosis not present

## 2021-05-02 DIAGNOSIS — H35373 Puckering of macula, bilateral: Secondary | ICD-10-CM | POA: Diagnosis not present

## 2021-05-02 DIAGNOSIS — H353131 Nonexudative age-related macular degeneration, bilateral, early dry stage: Secondary | ICD-10-CM | POA: Diagnosis not present

## 2021-05-05 ENCOUNTER — Other Ambulatory Visit: Payer: Self-pay | Admitting: Adult Health

## 2021-05-06 NOTE — Telephone Encounter (Signed)
Patient called to follow up on refill for hydrochlorothiazide (HYDRODIURIL) 12.5 MG tablet    Please send to  [facility], [place] [address] Phone:  [phone]  Fax:  [phone]          Please advise

## 2021-05-13 ENCOUNTER — Telehealth: Payer: Self-pay | Admitting: Adult Health

## 2021-05-13 NOTE — Telephone Encounter (Signed)
Left message for patient to call back and schedule Medicare Annual Wellness Visit (AWV) either virtually or in office. Left  my Katelyn [name] number [phone]   Last AWV ;08/11/17  please schedule at anytime with [facility] Nurse Health Advisor 1 or 2   This should be a 45 minute visit.

## 2021-05-23 ENCOUNTER — Ambulatory Visit (INDEPENDENT_AMBULATORY_CARE_PROVIDER_SITE_OTHER): Payer: PPO

## 2021-05-23 VITALS — Ht 60.0 in | Wt 132.0 lb

## 2021-05-23 DIAGNOSIS — Z0000 Encounter for general adult medical examination without abnormal findings: Secondary | ICD-10-CM

## 2021-05-23 NOTE — Patient Instructions (Addendum)
Ms. Katelyn [name] , Thank you for taking time to come for your Medicare Wellness Visit. I appreciate your ongoing commitment to your health goals. Please review the following plan we discussed and let me know if I can assist you in the future.   These are the goals we discussed:  Goals      Increase physical activity     Maintain current physical activity. Going to [employer].     Pharmacy Care Plan     CARE PLAN ENTRY  Current Barriers:  Chronic Disease Management support, education, and care coordination needs related to CAD, HTN, HLD, and Osteopenia  Pharmacist Clinical Goal(s):  Over next 6 months,  Blood pressure:  Maintain blood pressure within goal of your provider (130/80 or 140/90)  Maintain low salt diet.  Continue self health monitoring activities as directed today (blood pressure monitoring). High cholesterol: Cholesterol goals: Total Cholesterol goal under 200, Triglycerides goal under 150, HDL goal above 40 (men) or above 50 (women), LDL goal under 100.  Lipid Panel     Component Value Date/Time   CHOL 196 11/02/2018 1000   TRIG 147.0 11/02/2018 1000   HDL 52.20 11/02/2018 1000   CHOLHDL 4 11/02/2018 1000   VLDL 29.4 11/02/2018 1000   LDLCALC 114 (H) 11/02/2018 1000  Osteopenia prevent bone fractures.   Interventions: Comprehensive medication review performed. Blood pressure:  Discussed need to continue checking blood pressure at home.  Discussed diet modifications. DASH diet:  following a diet emphasizing fruits and vegetables and low-fat dairy products along with whole grains, fish, poultry, and nuts. Reducing red meats and sugars.  Exercising-   Reducing the amount of salt intake to 1500mg /per day.  Recommend using a salt substitute to replace your salt if you need flavor.    Getting enough potassium in your diet equaling 3500-5000mg /day.  This helps to regulate BP by balancing out the effects of salt.   Weight reduction- We discussed losing 5-10% of body  weight Continue:  atenolol 50mg , 0.5 tablets (25mg ) once daily  hydrochlorothiazide 12.5mg , 1 tablet once daily  High cholesterol/ CAD How to reduce cholesterol through diet/weight management and physical activity.    We discussed how a diet high in plant sterols (fruits/vegetables/nuts/whole grains/legumes) may reduce your cholesterol.  Encouraged increasing fiber to a daily intake of 10-25g/day  Continue:  pravastatin 40mg , 1 tablet once daily at bedtime  aspirin 81mg , 1 tablet once daily  Osteopenia (disorder of bone and cartilage)  Continue calcium gummies 500mg , 2 gummies once daily  cholecalciferol (vitamin D3) 2000 units, 1 tablet once daily   estradiol (Vivelle-Dot) 0.05mg /24 hour patch, 1 patch twice weekly  Patient Self Care Activities:  Patient verbalizes understanding of plan to continue home blood pressure readings, Self administers medications as prescribed, Calls pharmacy for medication refills, and Calls provider office for new concerns or questions Pick up potassium chloride 8mEq tablets when done taking current supply of potassium 49mEq.   Initial goal documentation         Advanced directives: Yes  Conditions/risks identified: None  Next appointment: Follow up in one year for your annual wellness visit    Preventive Care 65 Years and Older, Female Preventive care refers to lifestyle choices and visits with your health care provider that can promote health and wellness. What does preventive care include? A yearly physical exam. This is also called an annual well check. Dental exams once or twice a year. Routine eye exams. Ask your health care provider how often you should have  your eyes checked. Personal lifestyle choices, including: Daily care of your teeth and gums. Regular physical activity. Eating a healthy diet. Avoiding tobacco and drug use. Limiting alcohol use. Practicing safe sex. Taking low-dose aspirin every day. Taking vitamin and mineral  supplements as recommended by your health care provider. What happens during an annual well check? The services and screenings done by your health care provider during your annual well check will depend on your age, overall health, lifestyle risk factors, and family history of disease. Counseling  Your health care provider may ask you questions about your: Alcohol use. Tobacco use. Drug use. Emotional well-being. Home and relationship well-being. Sexual activity. Eating habits. History of falls. Memory and ability to understand (cognition). Work and work Statistician. Reproductive health. Screening  You may have the following tests or measurements: Height, weight, and BMI. Blood pressure. Lipid and cholesterol levels. These may be checked every 5 years, or more frequently if you are over 81 years old. Skin check. Lung cancer screening. You may have this screening every year starting at age 32 if you have a 30-pack-year history of smoking and currently smoke or have quit within the past 15 years. Fecal occult blood test (FOBT) of the stool. You may have this test every year starting at age 70. Flexible sigmoidoscopy or colonoscopy. You may have a sigmoidoscopy every 5 years or a colonoscopy every 10 years starting at age 42. Hepatitis C blood test. Hepatitis B blood test. Sexually transmitted disease (STD) testing. Diabetes screening. This is done by checking your blood sugar (glucose) after you have not eaten for a while (fasting). You may have this done every 1-3 years. Bone density scan. This is done to screen for osteoporosis. You may have this done starting at age 9. Mammogram. This may be done every 1-2 years. Talk to your health care provider about how often you should have regular mammograms. Talk with your health care provider about your test results, treatment options, and if necessary, the need for more tests. Vaccines  Your health care provider may recommend certain  vaccines, such as: Influenza vaccine. This is recommended every year. Tetanus, diphtheria, and acellular pertussis (Tdap, Td) vaccine. You may need a Td booster every 10 years. Zoster vaccine. You may need this after age 62. Pneumococcal 13-valent conjugate (PCV13) vaccine. One dose is recommended after age 40. Pneumococcal polysaccharide (PPSV23) vaccine. One dose is recommended after age 40. Talk to your health care provider about which screenings and vaccines you need and how often you need them. This information is not intended to replace advice given to you by your health care provider. Make sure you discuss any questions you have with your health care provider. Document Released: 06/07/2015 Document Revised: 01/29/2016 Document Reviewed: 03/12/2015 Elsevier Interactive Patient Education  2017 [place] Prevention in the Home Falls can cause injuries. They can happen to people of all ages. There are many things you can do to make your home safe and to help prevent falls. What can I do on the outside of my home? Regularly fix the edges of walkways and driveways and fix any cracks. Remove anything that might make you trip as you walk through a door, such as a raised step or threshold. Trim any bushes or trees on the path to your home. Use bright outdoor lighting. Clear any walking paths of anything that might make someone trip, such as rocks or tools. Regularly check to see if handrails are loose or broken. Make sure that  both sides of any steps have handrails. Any raised decks and porches should have guardrails on the edges. Have any leaves, snow, or ice cleared regularly. Use sand or salt on walking paths during winter. Clean up any spills in your garage right away. This includes oil or grease spills. What can I do in the bathroom? Use night lights. Install grab bars by the toilet and in the tub and shower. Do not use towel bars as grab bars. Use non-skid mats or decals in  the tub or shower. If you need to sit down in the shower, use a plastic, non-slip stool. Keep the floor dry. Clean up any water that spills on the floor as soon as it happens. Remove soap buildup in the tub or shower regularly. Attach bath mats securely with double-sided non-slip rug tape. Do not have throw rugs and other things on the floor that can make you trip. What can I do in the bedroom? Use night lights. Make sure that you have a light by your bed that is easy to reach. Do not use any sheets or blankets that are too big for your bed. They should not hang down onto the floor. Have a firm chair that has side arms. You can use this for support while you get dressed. Do not have throw rugs and other things on the floor that can make you trip. What can I do in the kitchen? Clean up any spills right away. Avoid walking on wet floors. Keep items that you use a lot in easy-to-reach places. If you need to reach something above you, use a strong step stool that has a grab bar. Keep electrical cords out of the way. Do not use floor polish or wax that makes floors slippery. If you must use wax, use non-skid floor wax. Do not have throw rugs and other things on the floor that can make you trip. What can I do with my stairs? Do not leave any items on the stairs. Make sure that there are handrails on both sides of the stairs and use them. Fix handrails that are broken or loose. Make sure that handrails are as long as the stairways. Check any carpeting to make sure that it is firmly attached to the stairs. Fix any carpet that is loose or worn. Avoid having throw rugs at the top or bottom of the stairs. If you do have throw rugs, attach them to the floor with carpet tape. Make sure that you have a light switch at the top of the stairs and the bottom of the stairs. If you do not have them, ask someone to add them for you. What else can I do to help prevent falls? Wear shoes that: Do not have high  heels. Have rubber bottoms. Are comfortable and fit you well. Are closed at the toe. Do not wear sandals. If you use a stepladder: Make sure that it is fully opened. Do not climb a closed stepladder. Make sure that both sides of the stepladder are locked into place. Ask someone to hold it for you, if possible. Clearly mark and make sure that you can see: Any grab bars or handrails. First and last steps. Where the edge of each step is. Use tools that help you move around (mobility aids) if they are needed. These include: Canes. Walkers. Scooters. Crutches. Turn on the lights when you go into a dark area. Replace any light bulbs as soon as they burn out. Set up your furniture so  you have a clear path. Avoid moving your furniture around. If any of your floors are uneven, fix them. If there are any pets around you, be aware of where they are. Review your medicines with your doctor. Some medicines can make you feel dizzy. This can increase your chance of falling. Ask your doctor what other things that you can do to help prevent falls. This information is not intended to replace advice given to you by your health care provider. Make sure you discuss any questions you have with your health care provider. Document Released: 03/07/2009 Document Revised: 10/17/2015 Document Reviewed: 06/15/2014 Elsevier Interactive Patient Education  2017 [employer].  This is a list of the screening recommended for you and due dates:  Health Maintenance  Topic Date Due   COVID-19 Vaccine (3 - Pfizer risk series) 08/02/2019   Tetanus Vaccine  08/06/2019   Zoster (Shingles) Vaccine (1 of 2) 08/21/2021*   Flu Shot  08/22/2021*   Pneumonia Vaccine  Completed   DEXA scan (bone density measurement)  Completed   Hepatitis C Screening: USPSTF Recommendation to screen - Ages 48-79 yo.  Completed   HPV Vaccine  Aged Out  *Topic was postponed. The date shown is not the original due date.

## 2021-05-23 NOTE — Progress Notes (Signed)
Subjective:   Katelyn [name] is a 80 y.o. female who presents for Medicare Annual (Subsequent) preventive examination.  Review of Systems    No ROS Cardiac Risk Factors include: advanced age (>49men, >76 women);hypertension   Objective:    Today's Vitals   05/23/21 1402  Weight: 132 lb (59.9 kg)  Height: 5' (1.524 m)   Body mass index is 25.78 kg/m.  Advanced Directives 05/23/2021 09/12/2015 09/03/2015 06/26/2014 06/25/2014  Does Patient Have a Medical Advance Directive? Yes Yes Yes Yes No  Type of Paramedic of [place];Living will Living will Living will Living will;Healthcare Power of Attorney -  Does patient want to make changes to medical advance directive? No - Patient declined No - Patient declined - No - Patient declined -  Copy of [place] in Chart? No - copy requested - - Yes -  Would patient like information on creating a medical advance directive? - - - - No - patient declined information    Current Medications (verified) Outpatient Encounter Medications as of 05/23/2021  Medication Sig   Ascorbic Acid (VITAMIN C) 250 MG CHEW Chew 250 mg by mouth daily. (gummies)   aspirin 81 MG EC tablet Take 81 mg by mouth daily.     atenolol (TENORMIN) 50 MG tablet Take 1/2 (one-half) tablet by mouth once daily   Calcium-Phosphorus-Vitamin D (CALCIUM GUMMIES PO) Take 500 mg by mouth daily.   Cholecalciferol (VITAMIN D3) 2000 UNITS TABS Take 1 tablet by mouth daily.     Cyanocobalamin (VITAMIN B-12) 5000 MCG TBDP Take 1 tablet by mouth daily.    estradiol (VIVELLE-DOT) 0.05 MG/24HR patch APPLY 1 PATCH TOPICALLY TWICE A WEEK   hydrochlorothiazide (HYDRODIURIL) 12.5 MG tablet Take 1 tablet by mouth once daily   Influenza vac split quadrivalent PF (FLUZONE HIGH-DOSE) 0.5 ML injection Fluzone High-Dose 2019-20 (PF) 180 mcg/0.5 mL intramuscular syringe  PHARMACIST ADMINISTERED IMMUNIZATION ADMINISTERED AT TIME OF DISPENSING (Patient not  taking: Reported on 01/16/2021)   LUTEIN PO Take 1 tablet by mouth daily.   Multiple Vitamins-Minerals (ICAPS AREDS 2 PO) Take by mouth.   potassium chloride SA (KLOR-CON) 20 MEQ tablet Take 1 tablet by mouth once daily   pravastatin (PRAVACHOL) 40 MG tablet TAKE 1 TABLET BY MOUTH AT BEDTIME   tretinoin (RETIN-A) 0.05 % cream Apply topically at bedtime.   No facility-administered encounter medications on file as of 05/23/2021.    Allergies (verified) Penicillins and Tetracycline   History: Past Medical History:  Diagnosis Date   Anxiety state, unspecified    Atypical mole 2015   left ant thigh moderate   Basal cell carcinoma 10/17/2013   right side of neck tx with bx   Disorder of bone and cartilage, unspecified    Osteoarthrosis, unspecified whether generalized or localized, unspecified site    Other and unspecified hyperlipidemia    Palpitations    Shortness of breath    Unspecified essential hypertension    Past Surgical History:  Procedure Laterality Date   BILATERAL SALPINGOOPHORECTOMY  2001   BREAST CYST ASPIRATION  2001   BREAST EXCISIONAL BIOPSY Right 09/12/2015   BREAST LUMPECTOMY WITH RADIOACTIVE SEED LOCALIZATION Right 09/12/2015   Procedure: RIGHT BREAST LUMPECTOMY WITH RADIOACTIVE SEED LOCALIZATION;  Surgeon: Erroll Luna, MD;  Location: [facility];  Service: General;  Laterality: Right;   COLONOSCOPY  [id number]   LEFT HEART CATHETERIZATION WITH CORONARY ANGIOGRAM N/A 06/26/2014   Procedure: LEFT HEART CATHETERIZATION WITH CORONARY ANGIOGRAM;  Surgeon: Shanon Brow  Loren Racer, MD;  Location: [facility] C[facility] LAB;  Service: Cardiovascular;  Laterality: N/A;   TONSILLECTOMY  1957   TOTAL ABDOMINAL HYSTERECTOMY  2001   Family History  Problem Relation Age of Onset   Pneumonia Mother    Stroke Mother    Asthma Other    Allergies Other    Asthma Father    Colon cancer Neg Hx    Stomach cancer Neg Hx    Social History   Socioeconomic History   Marital status:  Married    Spouse name: Not on file   Number of children: Not on file   Years of education: Not on file   Highest education level: Not on file  Occupational History   Not on file  Tobacco Use   Smoking status: Never   Smokeless tobacco: Never  Vaping Use   Vaping Use: Never used  Substance and Sexual Activity   Alcohol use: No   Drug use: No   Sexual activity: Not on file  Other Topics Concern   Not on file  Social History Narrative   Teacher - elementary school    Married    Two children    Three grandchildren       She likes to read    Social Determinants of Health   Financial Resource Strain: Low Risk    Difficulty of Paying Living Expenses: Not hard at all  Food Insecurity: No Food Insecurity   Worried About Charity fundraiser in the Last Year: Never true   Arboriculturist in the Last Year: Never true  Transportation Needs: No Transportation Needs   Lack of Transportation (Medical): No   Lack of Transportation (Non-Medical): No  Physical Activity: Insufficiently Active   Days of Exercise per Week: 3 days   Minutes of Exercise per Session: 30 min  Stress: No Stress Concern Present   Feeling of Stress : Not at all  Social Connections: Socially Integrated   Frequency of Communication with Friends and Family: More than three times a week   Frequency of Social Gatherings with Friends and Family: More than three times a week   Attends Religious Services: More than 4 times per year   Active Member of [employer] or Organizations: Yes   Attends Music therapist: More than 4 times per year   Marital Status: Married     Clinical Intake:  Pre-visit preparation completed: Yes  Diabetic? No  Interpreter Needed?: NoActivities of Daily Living In your present state of health, do you have any difficulty performing the following activities: 05/23/2021 01/16/2021  Hearing? N N  Vision? N N  Difficulty concentrating or making decisions? N Y  Comment - memory  issues  Walking or climbing stairs? N N  Dressing or bathing? N N  Doing errands, shopping? N N  Preparing Food and eating ? N -  Using the Toilet? N -  In the past six months, have you accidently leaked urine? N -  Do you have problems with loss of bowel control? N -  Managing your Medications? N -  Managing your Finances? N -  Housekeeping or managing your Housekeeping? N -  Some recent data might be hidden    Patient Care Team: Dorothyann Peng, NP as PCP - General (Family Medicine) Earnie Larsson, [facility] as Pharmacist (Pharmacist) Warren Danes, PA-C as Physician Assistant (Dermatology)  Indicate any recent Medical Services you may have received from other than [facility] providers in the past year (date  may be approximate).     Assessment:   This is a routine wellness examination for Philip.  Virtual Visit via Telephone Note  I connected with  Katelyn [name] on 05/23/21 at  2:30 PM EST by telephone and verified that I am speaking with the correct person using two identifiers.  Location: Patient: Home Provider: Office Persons participating in the virtual visit: patient/Nurse Health Advisor   I discussed the limitations, risks, security and privacy concerns of performing an evaluation and management service by telephone and the availability of in person appointments. The patient expressed understanding and agreed to proceed.  Interactive audio and video telecommunications were attempted between this nurse and patient, however failed, due to patient having technical difficulties OR patient did not have access to video capability.  We continued and completed visit with audio only.  Some vital signs may be absent or patient reported.   Criselda Peaches, LPN   Hearing/Vision screen Hearing Screening - Comments:: No difficulty hearing Vision Screening - Comments:: Wears reading glasses. Followed by Dr Marica Otter.  Dietary issues and exercise activities  discussed: Current Exercise Habits: Home exercise routine, Type of exercise: walking, Time (Minutes): 30, Frequency (Times/Week): 3, Weekly Exercise (Minutes/Week): 90, Intensity: Mild   Goals Addressed             This Visit's Progress    Increase physical activity       Maintain current physical activity. Going to [employer].       Depression Screen PHQ 2/9 Scores 05/23/2021 01/16/2021 01/16/2020 11/02/2018 08/11/2017 07/17/2015 02/09/2015  PHQ - 2 Score 0 0 2 0 0 0 0  PHQ- 9 Score - - 3 - - - -    Fall Risk Fall Risk  05/23/2021 01/16/2021 01/16/2020 12/19/2019 11/02/2018  Falls in the past year? 0 0 0 0 1  Comment - - - Emmi Telephone Survey: data to providers prior to load -  Number falls in past yr: 0 0 0 - 0  Injury with Fall? 0 0 0 - 0  Comment - - - - -    FALL RISK PREVENTION PERTAINING TO THE HOME:  Any stairs in or around the home? Yes  If so, are there any without handrails? No  Home free of loose throw rugs in walkways, pet beds, electrical cords, etc? Yes  Adequate lighting in your home to reduce risk of falls? Yes   ASSISTIVE DEVICES UTILIZED TO PREVENT FALLS:  Life alert? No  Use of a cane, walker or w/c? No  Grab bars in the bathroom? Yes  Shower chair or bench in shower? Yes  Elevated toilet seat or a handicapped toilet? Yes   TIMED UP AND GO:  Was the test performed? No . Audio Visit  Cognitive Function:   6CIT Screen 05/23/2021  What Year? 0 points  What month? 0 points  What time? 0 points  Count back from 20 0 points  Months in reverse 0 points  Repeat phrase 0 points  Total Score 0    Immunizations Immunization History  Administered Date(s) Administered   Fluad Quad(high Dose 65+) 02/22/2019, 04/11/2020   Influenza Split 03/13/2011, 03/23/2012   Influenza Whole 02/29/2008, 03/08/2009   Influenza, High Dose Seasonal PF 03/21/2014, 03/26/2015, 03/03/2016, 03/12/2017, 02/03/2018   Influenza,inj,Quad PF,6+ Mos 03/01/2013   PFIZER(Purple  Top)SARS-COV-2 Vaccination 06/14/2019, 07/05/2019   Pneumococcal Conjugate-13 11/27/2013   Pneumococcal Polysaccharide-23 08/05/2009   Td 08/05/2009   Zoster, Live 11/28/2012    TDAP status: Due, Education has  been provided regarding the importance of this vaccine. Advised may receive this vaccine at local pharmacy or Health Dept. Aware to provide a copy of the vaccination record if obtained from local pharmacy or Health Dept. Verbalized acceptance and understanding.  Flu Vaccine status: Due, Education has been provided regarding the importance of this vaccine. Advised may receive this vaccine at local pharmacy or Health Dept. Aware to provide a copy of the vaccination record if obtained from local pharmacy or Health Dept. Verbalized acceptance and understanding.    Covid-19 vaccine status: Information provided on how to obtain vaccines.   Qualifies for Shingles Vaccine? Yes   Zostavax completed No   Shingrix Completed?: No.    Education has been provided regarding the importance of this vaccine. Patient has been advised to call insurance company to determine out of pocket expense if they have not yet received this vaccine. Advised may also receive vaccine at local pharmacy or Health Dept. Verbalized acceptance and understanding.  Screening Tests Health Maintenance  Topic Date Due   COVID-19 Vaccine (3 - Pfizer risk series) 08/02/2019   TETANUS/TDAP  08/06/2019   Zoster Vaccines- Shingrix (1 of 2) 08/21/2021 (Originally 12/31/1959)   INFLUENZA VACCINE  08/22/2021 (Originally 12/23/2020)   Pneumonia Vaccine 54+ Years old  Completed   DEXA SCAN  Completed   Hepatitis C Screening  Completed   HPV VACCINES  Aged Out    Health Maintenance  Health Maintenance Due  Topic Date Due   COVID-19 Vaccine (3 - Pfizer risk series) 08/02/2019   TETANUS/TDAP  08/06/2019    Additional Screening:   Vision Screening: Recommended annual ophthalmology exams for early detection of glaucoma and other  disorders of the eye. Is the patient up to date with their annual eye exam?  Yes  Who is the provider or what is the name of the office in which the patient attends annual eye exams? Followed by Dr Marica Otter  Dental Screening: Recommended annual dental exams for proper oral hygiene  Community Resource Referral / Chronic Care Management:  CRR required this visit?  No   CCM required this visit?  No      Plan:     I have personally reviewed and noted the following in the patients chart:   Medical and social history Use of alcohol, tobacco or illicit drugs  Current medications and supplements including opioid prescriptions.  Functional ability and status Nutritional status Physical activity Advanced directives List of other physicians Hospitalizations, surgeries, and ER visits in previous 12 months Vitals Screenings to include cognitive, depression, and falls Referrals and appointments  In addition, I have reviewed and discussed with patient certain preventive protocols, quality metrics, and best practice recommendations. A written personalized care plan for preventive services as well as general preventive health recommendations were provided to patient.     Criselda Peaches, LPN   [id number]

## 2021-06-04 ENCOUNTER — Other Ambulatory Visit: Payer: Self-pay | Admitting: Adult Health

## 2021-06-04 DIAGNOSIS — E785 Hyperlipidemia, unspecified: Secondary | ICD-10-CM

## 2021-06-13 ENCOUNTER — Encounter: Payer: Self-pay | Admitting: Family Medicine

## 2021-06-13 ENCOUNTER — Ambulatory Visit (INDEPENDENT_AMBULATORY_CARE_PROVIDER_SITE_OTHER): Payer: PPO | Admitting: Family Medicine

## 2021-06-13 VITALS — BP 144/98 | HR 70 | Temp 98.8°F | Wt 138.0 lb

## 2021-06-13 DIAGNOSIS — R0600 Dyspnea, unspecified: Secondary | ICD-10-CM

## 2021-06-13 DIAGNOSIS — I10 Essential (primary) hypertension: Secondary | ICD-10-CM

## 2021-06-13 DIAGNOSIS — J310 Chronic rhinitis: Secondary | ICD-10-CM | POA: Diagnosis not present

## 2021-06-13 MED ORDER — FLUTICASONE PROPIONATE 50 MCG/ACT NA SUSP: 1.0000 | Freq: Every day | NASAL | 0 refills | Status: AC

## 2021-06-13 NOTE — Progress Notes (Signed)
Subjective:    Patient ID: Katelyn [name], female    DOB: 05/03/1941, 81 y.o.   MRN: [id number]  Chief Complaint  Patient presents with   Shortness of Breath    SOB and then BP goes up. Had covid in October, congestion has not completely gone away. Last night 170.      HPI Patient was seen today for ongoing concerns.  Patient endorses feeling "blocked and sinuses" since having COVID-19 virus in October 2022.  Has difficulty describing sensation, denies congestion.  States feels "breathy" and her nose feels dry.  May notice the sensation with activity.  Tried guanfacine several weeks ago which caused rhinorrhea.  Steam helped.  Pt denies chest tightness, chest pain, cough, ear pain/pressure, sore throat, shortness of breath, wheezing.  States systolic BP was 979 last night so she took a whole tab of atenolol 50 mg.  Patient did not take BP meds this morning.  Denies increased sodium intake, caffeine intake, headaches, anxiety but later inquires if anxiety could be contributing.  Past Medical History:  Diagnosis Date   Anxiety state, unspecified    Atypical mole 2015   left ant thigh moderate   Basal cell carcinoma 10/17/2013   right side of neck tx with bx   Disorder of bone and cartilage, unspecified    Osteoarthrosis, unspecified whether generalized or localized, unspecified site    Other and unspecified hyperlipidemia    Palpitations    Shortness of breath    Unspecified essential hypertension     Allergies  Allergen Reactions   Penicillins     REACTION: rash   Tetracycline     REACTION: rash    ROS General: Denies fever, chills, night sweats, changes in weight, changes in appetite HEENT: Denies headaches, ear pain, changes in vision, rhinorrhea, sore throat + "feels blocked and sinuses" CV: Denies CP, palpitations, SOB, orthopnea Pulm: Denies SOB, cough, wheezing GI: Denies abdominal pain, nausea, vomiting, diarrhea, constipation GU: Denies dysuria, hematuria,  frequency, vaginal discharge Msk: Denies muscle cramps, joint pains Neuro: Denies weakness, numbness, tingling Skin: Denies rashes, bruising Psych: Denies depression, anxiety, hallucinations     Objective:    Blood pressure (!) 144/98, pulse 70, temperature 98.8 F (37.1 C), temperature source Oral, weight 138 lb (62.6 kg), SpO2 98 %.  Gen. Pleasant, well-nourished, in no distress, normal affect   HEENT: [place]/AT, face symmetric, conjunctiva clear, no scleral icterus, PERRLA, EOMI, nares patent without drainage, pharynx without erythema or exudate.  TMs full. Lungs: no accessory muscle use, CTAB, no wheezes or rales Cardiovascular: RRR, no m/r/g, no peripheral edema Musculoskeletal: No deformities, no cyanosis or clubbing, normal tone Neuro:  A&Ox3, CN II-XII intact, normal gait Skin:  Warm, no lesions/ rash   Wt Readings from Last 3 Encounters:  06/13/21 138 lb (62.6 kg)  05/23/21 132 lb (59.9 kg)  01/16/21 132 lb (59.9 kg)    Lab Results  Component Value Date   WBC 6.2 01/16/2021   HGB 14.0 01/16/2021   HCT 40.8 01/16/2021   PLT 280.0 01/16/2021   GLUCOSE 97 01/16/2021   CHOL 171 01/16/2021   TRIG 145.0 01/16/2021   HDL 46.10 01/16/2021   LDLCALC 96 01/16/2021   ALT 19 01/16/2021   AST 21 01/16/2021   NA 139 01/16/2021   K 3.9 01/16/2021   CL 103 01/16/2021   CREATININE 0.74 01/16/2021   BUN 14 01/16/2021   CO2 28 01/16/2021   TSH 1.30 01/16/2021   INR 1.11 06/26/2014   HGBA1C  5.6 06/26/2014    Assessment/Plan:  Essential hypertension -elevated -advised to take HCTZ this am and Atenolol 25 mg later this evening. -for continued elevation >140/90 take Atenolol 50 mg -lifestyle modifications -Encouraged to bring BP cuff to next OFV -given strict precautions.  Rhinitis, unspecified type  - Plan: fluticasone (FLONASE) 50 MCG/ACT nasal spray  Dyspnea, unspecified type -symptoms possibly related to allergies also consider anxiety component -6 MWT  negative -reassured lungs clear on exam  F/u prn in 1 wk with pcp  More than 50% of over 33-35 minutes spent in total in caring for this patient was spent face-to-face, reviewing the chart, counseling and/or coordinating care.   Grier Mitts, MD

## 2021-06-13 NOTE — Patient Instructions (Addendum)
We will have you come back in the next week to see your PCP to recheck your blood pressure.  When you come bring your home blood pressure cuff with you.  For continued elevation in your blood pressure greater than 140/90 is okay to take a whole tab of atenolol (50 mg).  Start looking at labels of items you are eating to see how much salt may contain.  A low-sodium diet is 2000 mg of salt in a day.  Do not forget to take your blood pressure medication this morning when you get home.  A prescription for Flonase nasal spray was sent to your pharmacy to help with the  blocked sensation you are having.  Consider using a humidifier or [place] nasal gel which can be found over-the-counter at your local drugstore to help with the dryness.

## 2021-06-20 ENCOUNTER — Ambulatory Visit (INDEPENDENT_AMBULATORY_CARE_PROVIDER_SITE_OTHER): Payer: PPO | Admitting: Adult Health

## 2021-06-20 ENCOUNTER — Encounter: Payer: Self-pay | Admitting: Adult Health

## 2021-06-20 VITALS — BP 128/72 | HR 70 | Temp 98.2°F | Ht 60.0 in | Wt 137.0 lb

## 2021-06-20 DIAGNOSIS — I10 Essential (primary) hypertension: Secondary | ICD-10-CM

## 2021-06-20 NOTE — Progress Notes (Signed)
Subjective:    Patient ID: Katelyn [name], female    DOB: 06-Jul-1940, 81 y.o.   MRN: [id number]  HPI 81 year old female who  has a past medical history of Anxiety state, unspecified, Atypical mole (2015), Basal cell carcinoma (10/17/2013), Disorder of bone and cartilage, unspecified, Osteoarthrosis, unspecified whether generalized or localized, unspecified site, Other and unspecified hyperlipidemia, Palpitations, Shortness of breath, and Unspecified essential hypertension.  She was seen roughly a week ago by another provider in the office.  At this time her systolic blood pressure was 170 the night prior so she took a full tab of her atenolol 50 mg.  At this time she did eat denied increased sodium intake, caffeine intake, or headaches.  He was advised to monitor her blood pressure at home and if BP readings above 140/90 then increase her atenolol from 5 mg to 50 mg daily.  At home she has been monitoring her BP at home with readings in the 120-130 on her usual dose of medication   BP Readings from Last 3 Encounters:  06/20/21 128/72  06/13/21 (!) 144/98  01/16/21 120/80    Review of Systems See HPI   Past Medical History:  Diagnosis Date   Anxiety state, unspecified    Atypical mole 2015   left ant thigh moderate   Basal cell carcinoma 10/17/2013   right side of neck tx with bx   Disorder of bone and cartilage, unspecified    Osteoarthrosis, unspecified whether generalized or localized, unspecified site    Other and unspecified hyperlipidemia    Palpitations    Shortness of breath    Unspecified essential hypertension     Social History   Socioeconomic History   Marital status: Married    Spouse name: Not on file   Number of children: Not on file   Years of education: Not on file   Highest education level: Not on file  Occupational History   Not on file  Tobacco Use   Smoking status: Never   Smokeless tobacco: Never  Vaping Use   Vaping Use: Never used   Substance and Sexual Activity   Alcohol use: No   Drug use: No   Sexual activity: Not on file  Other Topics Concern   Not on file  Social History Narrative   Teacher - elementary school    Married    Two children    Three grandchildren       She likes to read    Social Determinants of Health   Financial Resource Strain: Low Risk    Difficulty of Paying Living Expenses: Not hard at all  Food Insecurity: No Food Insecurity   Worried About Charity fundraiser in the Last Year: Never true   Arboriculturist in the Last Year: Never true  Transportation Needs: No Transportation Needs   Lack of Transportation (Medical): No   Lack of Transportation (Non-Medical): No  Physical Activity: Insufficiently Active   Days of Exercise per Week: 3 days   Minutes of Exercise per Session: 30 min  Stress: No Stress Concern Present   Feeling of Stress : Not at all  Social Connections: Socially Integrated   Frequency of Communication with Friends and Family: More than three times a week   Frequency of Social Gatherings with Friends and Family: More than three times a week   Attends Religious Services: More than 4 times per year   Active Member of [employer] or Organizations: Yes  Attends Music therapist: More than 4 times per year   Marital Status: Married  Human resources officer Violence: Not At Risk   Fear of Current or Ex-Partner: No   Emotionally Abused: No   Physically Abused: No   Sexually Abused: No    Past Surgical History:  Procedure Laterality Date   BILATERAL SALPINGOOPHORECTOMY  2001   BREAST CYST ASPIRATION  2001   BREAST EXCISIONAL BIOPSY Right 09/12/2015   BREAST LUMPECTOMY WITH RADIOACTIVE SEED LOCALIZATION Right 09/12/2015   Procedure: RIGHT BREAST LUMPECTOMY WITH RADIOACTIVE SEED LOCALIZATION;  Surgeon: Erroll Luna, MD;  Location: [facility];  Service: General;  Laterality: Right;   COLONOSCOPY  [id number]   LEFT HEART CATHETERIZATION WITH CORONARY  ANGIOGRAM N/A 06/26/2014   Procedure: LEFT HEART CATHETERIZATION WITH CORONARY ANGIOGRAM;  Surgeon: Leonie Man, MD;  Location: [facility] CATH [facility];  Service: Cardiovascular;  Laterality: N/A;   TONSILLECTOMY  1957   TOTAL ABDOMINAL HYSTERECTOMY  2001    Family History  Problem Relation Age of Onset   Pneumonia Mother    Stroke Mother    Asthma Other    Allergies Other    Asthma Father    Colon cancer Neg Hx    Stomach cancer Neg Hx     Allergies  Allergen Reactions   Penicillins     REACTION: rash   Tetracycline     REACTION: rash    Current Outpatient Medications on File Prior to Visit  Medication Sig Dispense Refill   Ascorbic Acid (VITAMIN C) 250 MG CHEW Chew 250 mg by mouth daily. (gummies)     aspirin 81 MG EC tablet Take 81 mg by mouth daily.       atenolol (TENORMIN) 50 MG tablet Take 1/2 (one-half) tablet by mouth once daily 45 tablet 0   Calcium-Phosphorus-Vitamin D (CALCIUM GUMMIES PO) Take 500 mg by mouth daily.     Cholecalciferol (VITAMIN D3) 2000 UNITS TABS Take 1 tablet by mouth daily.       Cyanocobalamin (VITAMIN B-12) 5000 MCG TBDP Take 1 tablet by mouth daily.      estradiol (VIVELLE-DOT) 0.05 MG/24HR patch APPLY 1 PATCH TOPICALLY TWICE A WEEK  12   fluticasone (FLONASE) 50 MCG/ACT nasal spray Place 1 spray into both nostrils daily. 16 g 0   hydrochlorothiazide (HYDRODIURIL) 12.5 MG tablet Take 1 tablet by mouth once daily 90 tablet 0   Influenza vac split quadrivalent PF (FLUZONE HIGH-DOSE) 0.5 ML injection      KLOR-CON M20 20 MEQ tablet Take 1 tablet by mouth once daily 90 tablet 0   LUTEIN PO Take 1 tablet by mouth daily.     Multiple Vitamins-Minerals (ICAPS AREDS 2 PO) Take by mouth.     pravastatin (PRAVACHOL) 40 MG tablet TAKE 1 TABLET BY MOUTH AT BEDTIME 90 tablet 0   tretinoin (RETIN-A) 0.05 % cream Apply topically at bedtime. 45 g 11   No current facility-administered medications on file prior to visit.    BP 128/72    Pulse 70    Temp 98.2 F  (36.8 C) (Oral)    Ht 5' (1.524 m)    Wt 137 lb (62.1 kg)    SpO2 93%    BMI 26.76 kg/m       Objective:   Physical Exam Vitals and nursing note reviewed.  Constitutional:      Appearance: Normal appearance.  Cardiovascular:     Rate and Rhythm: Normal rate and regular rhythm.  Pulses: Normal pulses.     Heart sounds: Normal heart sounds.  Pulmonary:     Effort: Pulmonary effort is normal.     Breath sounds: Normal breath sounds.  Musculoskeletal:        General: Normal range of motion.  Skin:    General: Skin is warm and dry.     Capillary Refill: Capillary refill takes less than 2 seconds.  Neurological:     General: No focal deficit present.     Mental Status: She is alert and oriented to person, place, and time.  Psychiatric:        Mood and Affect: Mood normal.        Behavior: Behavior normal.        Thought Content: Thought content normal.        Judgment: Judgment normal.      Assessment & Plan:  1. Essential hypertension - At goal  - No change in medications  - Follow up as needed  Dorothyann Peng, NP

## 2021-08-01 ENCOUNTER — Other Ambulatory Visit: Payer: Self-pay | Admitting: Adult Health

## 2021-08-27 ENCOUNTER — Other Ambulatory Visit: Payer: Self-pay | Admitting: Adult Health

## 2021-08-27 DIAGNOSIS — E785 Hyperlipidemia, unspecified: Secondary | ICD-10-CM

## 2021-09-18 ENCOUNTER — Telehealth: Payer: Self-pay | Admitting: Family Medicine

## 2021-09-18 ENCOUNTER — Encounter: Payer: Self-pay | Admitting: Family Medicine

## 2021-09-18 ENCOUNTER — Ambulatory Visit (INDEPENDENT_AMBULATORY_CARE_PROVIDER_SITE_OTHER): Payer: PPO | Admitting: Family Medicine

## 2021-09-18 ENCOUNTER — Ambulatory Visit (INDEPENDENT_AMBULATORY_CARE_PROVIDER_SITE_OTHER): Payer: PPO

## 2021-09-18 VITALS — BP 119/76 | HR 83 | Temp 99.2°F | Wt 134.4 lb

## 2021-09-18 DIAGNOSIS — R059 Cough, unspecified: Secondary | ICD-10-CM | POA: Diagnosis not present

## 2021-09-18 DIAGNOSIS — J069 Acute upper respiratory infection, unspecified: Secondary | ICD-10-CM

## 2021-09-18 DIAGNOSIS — R051 Acute cough: Secondary | ICD-10-CM | POA: Diagnosis not present

## 2021-09-18 DIAGNOSIS — R911 Solitary pulmonary nodule: Secondary | ICD-10-CM

## 2021-09-18 DIAGNOSIS — R0982 Postnasal drip: Secondary | ICD-10-CM

## 2021-09-18 DIAGNOSIS — R918 Other nonspecific abnormal finding of lung field: Secondary | ICD-10-CM

## 2021-09-18 LAB — POC COVID19 BINAXNOW: SARS Coronavirus 2 Ag: NEGATIVE

## 2021-09-18 LAB — POCT INFLUENZA A/B
Influenza A, POC: NEGATIVE
Influenza B, POC: NEGATIVE

## 2021-09-18 MED ORDER — BENZONATATE 100 MG PO CAPS: 100.0000 mg | ORAL_CAPSULE | Freq: Two times a day (BID) | ORAL | 0 refills | Status: DC | PRN

## 2021-09-18 NOTE — Telephone Encounter (Signed)
Pt contacted by this provider regarding CXR results from earlier today during acute visit.  CXR with increased AP diameter suggesting COPD.  A new 7 mm nodular density noted in LL lung field, may suggest granuloma or neoplasm.  Follow up CT recommended.  Pt wishes to proceed with CT.  Denies h/o tobacco use or 2nd hand exposure.  CT ordered. ? ?Grier Mitts, MD ?

## 2021-09-18 NOTE — Progress Notes (Signed)
Subjective:  ? ? Patient ID: Katelyn [name], female    DOB: 1941-05-12, 81 y.o.   MRN: [id number] ? ?Chief Complaint  ?Patient presents with  ? Sinus Problem  ?  Has cough and does not sound good. Had COVID back in oct and has had something in the back of her throat. Cough is not very productive   ? ? ?HPI ?Patient was seen today for acute concern.  Pt endorses productive cough, rhinorrhea, elevated temp 44F, and throat irritation x 1 wk.  Ribs are sore from coughing.  Appetite decreased this morning.  Pt denies headache, facial pain, ear pain/pressure, diarrhea, sick contacts.  Pt tried while at the scene, Tylenol, and ibuprofen for symptoms.  Home COVID testing negative over the weekend.  pt notes increased post nasal drainage x months s/p COVID-19 infection Oct 2022. ? ?Past Medical History:  ?Diagnosis Date  ? Anxiety state, unspecified   ? Atypical mole 2015  ? left ant thigh moderate  ? Basal cell carcinoma 10/17/2013  ? right side of neck tx with bx  ? Disorder of bone and cartilage, unspecified   ? Osteoarthrosis, unspecified whether generalized or localized, unspecified site   ? Other and unspecified hyperlipidemia   ? Palpitations   ? Shortness of breath   ? Unspecified essential hypertension   ? ? ?Allergies  ?Allergen Reactions  ? Penicillins   ?  REACTION: rash  ? Tetracycline   ?  REACTION: rash  ? ? ?ROS ?General: Denies chills, night sweats, changes in weight + temperature elevation, decreased appetite ?HEENT: Denies headaches, ear pain, changes in vision  + scratchy throat, rhinorrhea, postnasal drainage ?CV: Denies CP, palpitations, SOB, orthopnea ?Pulm: Denies SOB, wheezing +cough ?GI: Denies abdominal pain, nausea, vomiting, diarrhea, constipation ?GU: Denies dysuria, hematuria, frequency, vaginal discharge ?Msk: Denies muscle cramps, joint pains ?Neuro: Denies weakness, numbness, tingling ?Skin: Denies rashes, bruising ?Psych: Denies depression, anxiety, hallucinations ? ?   ?Objective:  ?   ?Blood pressure 119/76, pulse 83, temperature 99.2 ?F (37.3 ?C), temperature source Oral, weight 134 lb 6.4 oz (61 kg), SpO2 97 %. ? ?Gen. Pleasant, well-nourished, in no distress, normal affect   ?HEENT: [place]/AT, face symmetric, conjunctiva clear, no scleral icterus, PERRLA, EOMI, nares patent without drainage, Mallampati score 3.  Pharynx without erythema or exudate.  TMs full bilaterally.  No cervical lymphadenopathy. ?Lungs: no accessory muscle use, faint wheezing and slightly decreased bs in b/l bases. ?Cardiovascular: RRR, no m/r/g, no peripheral edema ?Musculoskeletal: No deformities, no cyanosis or clubbing, normal tone ?Neuro:  A&Ox3, CN II-XII intact, normal gait ?Skin:  Warm, no lesions/ rash ? ? ?Wt Readings from Last 3 Encounters:  ?09/18/21 134 lb 6.4 oz (61 kg)  ?06/20/21 137 lb (62.1 kg)  ?06/13/21 138 lb (62.6 kg)  ? ? ?Lab Results  ?Component Value Date  ? WBC 6.2 01/16/2021  ? HGB 14.0 01/16/2021  ? HCT 40.8 01/16/2021  ? PLT 280.0 01/16/2021  ? GLUCOSE 97 01/16/2021  ? CHOL 171 01/16/2021  ? TRIG 145.0 01/16/2021  ? HDL 46.10 01/16/2021  ? [place] 96 01/16/2021  ? ALT 19 01/16/2021  ? AST 21 01/16/2021  ? NA 139 01/16/2021  ? K 3.9 01/16/2021  ? CL 103 01/16/2021  ? CREATININE 0.74 01/16/2021  ? BUN 14 01/16/2021  ? CO2 28 01/16/2021  ? TSH 1.30 01/16/2021  ? INR 1.11 06/26/2014  ? HGBA1C 5.6 06/26/2014  ? ? ?Assessment/Plan: ? ?Acute cough  ?-likely 2/2 acute viral illness ?-Expectant  management with OTC cough/cold medications, warm fluids, honey, rest ?-Given slightly decreased breath sounds we will proceed with CXR ?-Rx for Tessalon sent to pharmacy. ?-Okay to take Tylenol or NSAIDs as needed for musculoskeletal chest wall discomfort 2/2 increased coughing.  Can also use heat. ?- Plan: POC COVID-19, POC Influenza A/B, DG Chest 2 View, benzonatate (TESSALON) 100 MG capsule ? ?Post-nasal drainage ?-Increase drainage s/p COVID virus infection in late 2022. ?-Discussed using antihistamine such as  Allegra, Zyrtec, Claritin, etc., saline nasal rinse or Flonase nasal spray. ?-Given handout ? ?Viral URI ?-At home COVID testing negative on 09/13/2021 ?-POC COVID testing negative ?-POC influenza testing negative ?-Expectant management with OTC cough/cold medications, saline nasal rinse, Flonase nasal spray, gargling with warm salt water Chloraseptic spray, warm fluids, rest, steam from shower, etc. ?-Plan: POC COVID, POC influenza A/B, benzonatate ?-Given strict precautions ? ?F/u prn ? ?Grier Mitts, MD ?

## 2021-09-23 ENCOUNTER — Ambulatory Visit (HOSPITAL_BASED_OUTPATIENT_CLINIC_OR_DEPARTMENT_OTHER)
Admission: RE | Admit: 2021-09-23 | Discharge: 2021-09-23 | Disposition: A | Discharge status: Home / Self Care | Payer: PPO | Source: Ambulatory Visit | Attending: Family Medicine | Admitting: Family Medicine

## 2021-09-23 ENCOUNTER — Encounter (HOSPITAL_BASED_OUTPATIENT_CLINIC_OR_DEPARTMENT_OTHER): Payer: Self-pay

## 2021-09-23 DIAGNOSIS — R918 Other nonspecific abnormal finding of lung field: Secondary | ICD-10-CM | POA: Insufficient documentation

## 2021-09-23 DIAGNOSIS — R911 Solitary pulmonary nodule: Secondary | ICD-10-CM | POA: Insufficient documentation

## 2021-09-23 DIAGNOSIS — I700 Atherosclerosis of aorta: Secondary | ICD-10-CM | POA: Diagnosis not present

## 2021-10-01 ENCOUNTER — Other Ambulatory Visit: Payer: Self-pay | Admitting: Adult Health

## 2021-10-01 DIAGNOSIS — R911 Solitary pulmonary nodule: Secondary | ICD-10-CM

## 2021-10-01 DIAGNOSIS — N63 Unspecified lump in unspecified breast: Secondary | ICD-10-CM

## 2021-10-03 ENCOUNTER — Other Ambulatory Visit: Payer: Self-pay | Admitting: Adult Health

## 2021-10-03 DIAGNOSIS — N63 Unspecified lump in unspecified breast: Secondary | ICD-10-CM

## 2021-10-07 ENCOUNTER — Ambulatory Visit: Payer: PPO

## 2021-10-07 ENCOUNTER — Other Ambulatory Visit: Payer: Self-pay | Admitting: Adult Health

## 2021-10-07 ENCOUNTER — Ambulatory Visit
Admission: RE | Admit: 2021-10-07 | Discharge: 2021-10-07 | Disposition: A | Discharge status: Home / Self Care | Payer: PPO | Source: Ambulatory Visit | Attending: Adult Health | Admitting: Adult Health

## 2021-10-07 DIAGNOSIS — N63 Unspecified lump in unspecified breast: Secondary | ICD-10-CM

## 2021-10-07 DIAGNOSIS — N6489 Other specified disorders of breast: Secondary | ICD-10-CM | POA: Diagnosis not present

## 2021-10-08 DIAGNOSIS — H353123 Nonexudative age-related macular degeneration, left eye, advanced atrophic without subfoveal involvement: Secondary | ICD-10-CM | POA: Diagnosis not present

## 2021-10-08 DIAGNOSIS — H35372 Puckering of macula, left eye: Secondary | ICD-10-CM | POA: Diagnosis not present

## 2021-10-08 DIAGNOSIS — H353112 Nonexudative age-related macular degeneration, right eye, intermediate dry stage: Secondary | ICD-10-CM | POA: Diagnosis not present

## 2021-10-15 ENCOUNTER — Other Ambulatory Visit: Payer: Self-pay | Admitting: Adult Health

## 2021-11-27 ENCOUNTER — Other Ambulatory Visit: Payer: Self-pay | Admitting: Adult Health

## 2021-11-27 DIAGNOSIS — E785 Hyperlipidemia, unspecified: Secondary | ICD-10-CM

## 2021-12-04 ENCOUNTER — Other Ambulatory Visit: Payer: Self-pay | Admitting: Adult Health

## 2022-01-15 ENCOUNTER — Ambulatory Visit: Payer: PPO | Admitting: Physician Assistant

## 2022-01-19 ENCOUNTER — Other Ambulatory Visit: Payer: Self-pay | Admitting: Adult Health

## 2022-01-20 ENCOUNTER — Other Ambulatory Visit: Payer: Self-pay | Admitting: Adult Health

## 2022-01-21 ENCOUNTER — Encounter: Payer: Self-pay | Admitting: Adult Health

## 2022-01-21 ENCOUNTER — Ambulatory Visit (INDEPENDENT_AMBULATORY_CARE_PROVIDER_SITE_OTHER): Payer: PPO | Admitting: Adult Health

## 2022-01-21 VITALS — BP 150/90 | HR 63 | Temp 98.1°F | Ht 60.0 in | Wt 137.0 lb

## 2022-01-21 DIAGNOSIS — I10 Essential (primary) hypertension: Secondary | ICD-10-CM | POA: Diagnosis not present

## 2022-01-21 DIAGNOSIS — Z0000 Encounter for general adult medical examination without abnormal findings: Secondary | ICD-10-CM | POA: Diagnosis not present

## 2022-01-21 DIAGNOSIS — I2510 Atherosclerotic heart disease of native coronary artery without angina pectoris: Secondary | ICD-10-CM | POA: Diagnosis not present

## 2022-01-21 DIAGNOSIS — E785 Hyperlipidemia, unspecified: Secondary | ICD-10-CM

## 2022-01-21 DIAGNOSIS — N3281 Overactive bladder: Secondary | ICD-10-CM | POA: Diagnosis not present

## 2022-01-21 DIAGNOSIS — M8589 Other specified disorders of bone density and structure, multiple sites: Secondary | ICD-10-CM

## 2022-01-21 LAB — COMPREHENSIVE METABOLIC PANEL
ALT: 20 U/L (ref 0–35)
AST: 20 U/L (ref 0–37)
Albumin: 4.5 g/dL (ref 3.5–5.2)
Alkaline Phosphatase: 59 U/L (ref 39–117)
BUN: 13 mg/dL (ref 6–23)
CO2: 28 mEq/L (ref 19–32)
Calcium: 9.8 mg/dL (ref 8.4–10.5)
Chloride: 103 mEq/L (ref 96–112)
Creatinine, Ser: 0.69 mg/dL (ref 0.40–1.20)
GFR: 82.18 mL/min (ref >60.00)
Glucose, Bld: 97 mg/dL (ref 70–99)
Potassium: 4.6 mEq/L (ref 3.5–5.1)
Sodium: 139 mEq/L (ref 135–145)
Total Bilirubin: 0.6 mg/dL (ref 0.2–1.2)
Total Protein: 7.7 g/dL (ref 6.0–8.3)

## 2022-01-21 LAB — TSH: TSH: 1.44 u[IU]/mL (ref 0.35–5.50)

## 2022-01-21 LAB — LIPID PANEL
Cholesterol: 200 mg/dL (ref 0–200)
HDL: 49 mg/dL (ref >39.00)
LDL Cholesterol: 112 mg/dL — ABNORMAL HIGH (ref 0–99)
NonHDL: 150.64
Total CHOL/HDL Ratio: 4
Triglycerides: 193 mg/dL — ABNORMAL HIGH (ref 0.0–149.0)
VLDL: 38.6 mg/dL (ref 0.0–40.0)

## 2022-01-21 LAB — CBC WITH DIFFERENTIAL/PLATELET
Basophils Absolute: 0 10*3/uL (ref 0.0–0.1)
Basophils Relative: 0.4 % (ref 0.0–3.0)
Eosinophils Absolute: 0.1 10*3/uL (ref 0.0–0.7)
Eosinophils Relative: 1.9 % (ref 0.0–5.0)
HCT: 43.1 % (ref 36.0–46.0)
Hemoglobin: 14.7 g/dL (ref 12.0–15.0)
Lymphocytes Relative: 26.2 % (ref 12.0–46.0)
Lymphs Abs: 2.1 10*3/uL (ref 0.7–4.0)
MCHC: 34.1 g/dL (ref 30.0–36.0)
MCV: 89.9 fl (ref 78.0–100.0)
Monocytes Absolute: 0.6 10*3/uL (ref 0.1–1.0)
Monocytes Relative: 7 % (ref 3.0–12.0)
Neutro Abs: 5.2 10*3/uL (ref 1.4–7.7)
Neutrophils Relative %: 64.5 % (ref 43.0–77.0)
Platelets: 257 10*3/uL (ref 150.0–400.0)
RBC: 4.79 Mil/uL (ref 3.87–5.11)
RDW: 13.3 % (ref 11.5–15.5)
WBC: 8 10*3/uL (ref 4.0–10.5)

## 2022-01-21 MED ORDER — ATENOLOL 25 MG PO TABS: 25.0000 mg | ORAL_TABLET | Freq: Every day | ORAL | 3 refills | Status: DC

## 2022-01-21 NOTE — Patient Instructions (Addendum)
It was great seeing you today   We will follow up with you regarding your lab work   Please let me know if you need anything   

## 2022-01-21 NOTE — Progress Notes (Signed)
Subjective:    Patient ID: Katelyn [name], female    DOB: 09/28/1940, 81 y.o.   MRN: [id number]  HPI Patient presents for yearly preventative medicine examination. She is a pleasant 81 year old female who  has a past medical history of Anxiety state, unspecified, Atypical mole (2015), Basal cell carcinoma (10/17/2013), Disorder of bone and cartilage, unspecified, Osteoarthrosis, unspecified whether generalized or localized, unspecified site, Other and unspecified hyperlipidemia, Palpitations, Shortness of breath, and Unspecified essential hypertension.  Hypertension -managed with atenolol 25 mg daily and HCTZ 12.5 mg daily.  She does monitor her blood pressures at home with readings in the 120s over 70s to 80s.  She denies dizziness, lightheadedness, chest pain, or shortness of breath. She took her medication just prior to arrival.  BP Readings from Last 3 Encounters:  01/21/22 (!) 150/90  09/18/21 119/76  06/20/21 128/72   Hyperlipidemia/CAD-managed with pravastatin 40 mg at bedtime and aspirin 81 mg daily.  She denies myalgia or fatigue.  In the past she has failed atorvastatin which caused myalgia. Has not had any CP, DOE, or dyspnea  Lab Results  Component Value Date   CHOL 171 01/16/2021   HDL 46.10 01/16/2021   LDLCALC 96 01/16/2021   TRIG 145.0 01/16/2021   CHOLHDL 4 01/16/2021   Osteopenia-takes vitamin D and calcium daily.  Also wears on estradiol 0.05 mg/24-hour patch, 1 patch twice weekly that is prescribed by GYN  OAB - reports ongoing issues with urinary urgency and frequency. This has been going on quite a while. Denies dysuria or hematuria.    All immunizations and health maintenance protocols were reviewed with the patient and needed orders were placed.  Appropriate screening laboratory values were ordered for the patient including screening of hyperlipidemia, renal function and hepatic function.  Medication reconciliation,  past medical history, social history,  problem list and allergies were reviewed in detail with the patient  Goals were established with regard to weight loss, exercise, and  diet in compliance with medications.  She does go to the [facility] multiple times a week for exercise. Wt Readings from Last 3 Encounters:  01/21/22 137 lb (62.1 kg)  09/18/21 134 lb 6.4 oz (61 kg)  06/20/21 137 lb (62.1 kg)    Review of Systems  Constitutional: Negative.   HENT: Negative.    Eyes: Negative.   Respiratory: Negative.    Cardiovascular: Negative.   Gastrointestinal: Negative.   Endocrine: Negative.   Genitourinary:  Positive for frequency and urgency.  Musculoskeletal: Negative.   Skin: Negative.   Allergic/Immunologic: Negative.   Neurological: Negative.   Hematological: Negative.   Psychiatric/Behavioral: Negative.     Past Medical History:  Diagnosis Date   Anxiety state, unspecified    Atypical mole 2015   left ant thigh moderate   Basal cell carcinoma 10/17/2013   right side of neck tx with bx   Disorder of bone and cartilage, unspecified    Osteoarthrosis, unspecified whether generalized or localized, unspecified site    Other and unspecified hyperlipidemia    Palpitations    Shortness of breath    Unspecified essential hypertension     Social History   Socioeconomic History   Marital status: Married    Spouse name: Not on file   Number of children: Not on file   Years of education: Not on file   Highest education level: Not on file  Occupational History   Not on file  Tobacco Use   Smoking status: Never  Smokeless tobacco: Never  Vaping Use   Vaping Use: Never used  Substance and Sexual Activity   Alcohol use: No   Drug use: No   Sexual activity: Not on file  Other Topics Concern   Not on file  Social History Narrative   Teacher - elementary school    Married    Two children    Three grandchildren       She likes to read    Social Determinants of Health   Financial Resource Strain: Low Risk   (05/23/2021)   Overall Financial Resource Strain (CARDIA)    Difficulty of Paying Living Expenses: Not hard at all  Food Insecurity: No Food Insecurity (05/23/2021)   Hunger Vital Sign    Worried About Running Out of Food in the Last Year: Never true    [place] in the Last Year: Never true  Transportation Needs: No Transportation Needs (05/23/2021)   PRAPARE - Hydrologist (Medical): No    Lack of Transportation (Non-Medical): No  Physical Activity: Insufficiently Active (05/23/2021)   Exercise Vital Sign    Days of Exercise per Week: 3 days    Minutes of Exercise per Session: 30 min  Stress: No Stress Concern Present (05/23/2021)   [place]    Feeling of Stress : Not at all  Social Connections: [place] (05/23/2021)   Social Connection and Isolation Panel [NHANES]    Frequency of Communication with Friends and Family: More than three times a week    Frequency of Social Gatherings with Friends and Family: More than three times a week    Attends Religious Services: More than 4 times per year    Active Member of Clubs or Organizations: Yes    Attends Music therapist: More than 4 times per year    Marital Status: Married  Human resources officer Violence: Not At Risk (05/23/2021)   Humiliation, Afraid, Rape, and Kick questionnaire    Fear of Current or Ex-Partner: No    Emotionally Abused: No    Physically Abused: No    Sexually Abused: No    Past Surgical History:  Procedure Laterality Date   BILATERAL SALPINGOOPHORECTOMY  2001   BREAST CYST ASPIRATION  2001   BREAST EXCISIONAL BIOPSY Right 09/12/2015   BREAST LUMPECTOMY WITH RADIOACTIVE SEED LOCALIZATION Right 09/12/2015   Procedure: RIGHT BREAST LUMPECTOMY WITH RADIOACTIVE SEED LOCALIZATION;  Surgeon: Erroll Luna, MD;  Location: [facility];  Service: General;  Laterality: Right;    COLONOSCOPY  [id number]   LEFT HEART CATHETERIZATION WITH CORONARY ANGIOGRAM N/A 06/26/2014   Procedure: LEFT HEART CATHETERIZATION WITH CORONARY ANGIOGRAM;  Surgeon: Leonie Man, MD;  Location: [facility] CATH LAB;  Service: Cardiovascular;  Laterality: N/A;   TONSILLECTOMY  1957   TOTAL ABDOMINAL HYSTERECTOMY  2001    Family History  Problem Relation Age of Onset   Pneumonia Mother    Stroke Mother    Asthma Other    Allergies Other    Asthma Father    Colon cancer Neg Hx    Stomach cancer Neg Hx     Allergies  Allergen Reactions   Penicillins     REACTION: rash   Tetracycline     REACTION: rash    Current Outpatient Medications on File Prior to Visit  Medication Sig Dispense Refill   Ascorbic Acid (VITAMIN C) 250 MG CHEW Chew 250 mg by mouth daily. (gummies)  aspirin 81 MG EC tablet Take 81 mg by mouth daily.       Calcium-Phosphorus-Vitamin D (CALCIUM GUMMIES PO) Take 500 mg by mouth daily.     Cholecalciferol (VITAMIN D3) 2000 UNITS TABS Take 1 tablet by mouth daily.       Cyanocobalamin (VITAMIN B-12) 5000 MCG TBDP Take 1 tablet by mouth daily.      fluticasone (FLONASE) 50 MCG/ACT nasal spray Place 1 spray into both nostrils daily. 16 g 0   hydrochlorothiazide (HYDRODIURIL) 12.5 MG tablet Take 1 tablet by mouth once daily 90 tablet 0   LUTEIN PO Take 1 tablet by mouth daily.     Multiple Vitamins-Minerals (ICAPS AREDS 2 PO) Take by mouth.     potassium chloride SA (KLOR-CON M20) 20 MEQ tablet Take 1 tablet by mouth once daily 90 tablet 3   pravastatin (PRAVACHOL) 40 MG tablet TAKE 1 TABLET BY MOUTH AT BEDTIME 90 tablet 0   tretinoin (RETIN-A) 0.05 % cream Apply topically at bedtime.     estradiol (VIVELLE-DOT) 0.1 MG/24HR patch Apply 1 patch twice a week by transdermal route.     No current facility-administered medications on file prior to visit.    BP (!) 150/90   Pulse 63   Temp 98.1 F (36.7 C) (Oral)   Ht 5' (1.524 m)   Wt 137 lb (62.1 kg)   SpO2 100%   BMI  26.76 kg/m       Objective:   Physical Exam Vitals and nursing note reviewed.  Constitutional:      General: She is not in acute distress.    Appearance: Normal appearance. She is well-developed. She is not ill-appearing.  HENT:     Head: Normocephalic and atraumatic.     Right Ear: Tympanic membrane, ear canal and external ear normal. There is no impacted cerumen.     Left Ear: Tympanic membrane, ear canal and external ear normal. There is no impacted cerumen.     Nose: Nose normal. No congestion or rhinorrhea.     Mouth/Throat:     Mouth: Mucous membranes are moist.     Pharynx: Oropharynx is clear. No oropharyngeal exudate or posterior oropharyngeal erythema.  Eyes:     General:        Right eye: No discharge.        Left eye: No discharge.     Extraocular Movements: Extraocular movements intact.     Conjunctiva/sclera: Conjunctivae normal.     Pupils: Pupils are equal, round, and reactive to light.  Neck:     Thyroid: No thyromegaly.     Vascular: No carotid bruit.     Trachea: No tracheal deviation.  Cardiovascular:     Rate and Rhythm: Normal rate and regular rhythm.     Pulses: Normal pulses.     Heart sounds: Normal heart sounds. No murmur heard.    No friction rub. No gallop.  Pulmonary:     Effort: Pulmonary effort is normal. No respiratory distress.     Breath sounds: Normal breath sounds. No stridor. No wheezing, rhonchi or rales.  Chest:     Chest wall: No tenderness.  Abdominal:     General: Abdomen is flat. Bowel sounds are normal. There is no distension.     Palpations: Abdomen is soft. There is no mass.     Tenderness: There is no abdominal tenderness. There is no right CVA tenderness, left CVA tenderness, guarding or rebound.     Hernia: No hernia is  present.  Musculoskeletal:        General: No swelling, tenderness, deformity or signs of injury. Normal range of motion.     Cervical back: Normal range of motion and neck supple.     Right lower leg: No  edema.     Left lower leg: No edema.  Lymphadenopathy:     Cervical: No cervical adenopathy.  Skin:    General: Skin is warm and dry.     Coloration: Skin is not jaundiced or pale.     Findings: No bruising, erythema, lesion or rash.  Neurological:     General: No focal deficit present.     Mental Status: She is alert and oriented to person, place, and time.     Cranial Nerves: No cranial nerve deficit.     Sensory: No sensory deficit.     Motor: No weakness.     Coordination: Coordination normal.     Gait: Gait normal.     Deep Tendon Reflexes: Reflexes normal.  Psychiatric:        Mood and Affect: Mood normal.        Behavior: Behavior normal.        Thought Content: Thought content normal.        Judgment: Judgment normal.       Assessment & Plan:  1. Routine general medical examination at a health care facility - Follow up in one year or sooner if needed - CBC with Differential/Platelet; Future - Comprehensive metabolic panel; Future - Lipid panel; Future - TSH; Future  2. Essential hypertension - Well controlled. Took medication prior to arrival  - No change in medications  - CBC with Differential/Platelet; Future - Comprehensive metabolic panel; Future - Lipid panel; Future - TSH; Future - atenolol (TENORMIN) 25 MG tablet; Take 1 tablet (25 mg total) by mouth daily.  Dispense: 90 tablet; Refill: 3  3. Coronary artery disease involving native coronary artery of native heart without angina pectoris - Continue with statin and ASA  - CBC with Differential/Platelet; Future - Comprehensive metabolic panel; Future - Lipid panel; Future - TSH; Future  4. Osteopenia of multiple sites - Continue with vitamin D, Calcium, and Estrogen patch  - CBC with Differential/Platelet; Future - Comprehensive metabolic panel; Future - Lipid panel; Future - TSH; Future  5. Dyslipidemia - Consider increase in statin  - CBC with Differential/Platelet; Future - Comprehensive  metabolic panel; Future - Lipid panel; Future - TSH; Future  6. OAB  Samples of Myrbetriq 25 mg drug were given to the patient, quantity 2, Lot Number [id number] - Follow up with improvement   Dorothyann Peng, NP

## 2022-01-22 ENCOUNTER — Other Ambulatory Visit: Payer: Self-pay | Admitting: *Deleted

## 2022-01-22 MED ORDER — MIRABEGRON ER 25 MG PO TB24
25.0000 mg | ORAL_TABLET | Freq: Every day | ORAL | 0 refills | Status: DC
Start: 2022-01-22 — End: 2023-01-26

## 2022-01-23 ENCOUNTER — Encounter (HOSPITAL_BASED_OUTPATIENT_CLINIC_OR_DEPARTMENT_OTHER): Payer: Self-pay

## 2022-01-23 ENCOUNTER — Other Ambulatory Visit: Payer: Self-pay

## 2022-01-23 ENCOUNTER — Emergency Department (HOSPITAL_BASED_OUTPATIENT_CLINIC_OR_DEPARTMENT_OTHER)
Admission: EM | Admit: 2022-01-23 | Discharge: 2022-01-23 | Disposition: A | Discharge status: Home / Self Care | Payer: PPO | Attending: Emergency Medicine | Admitting: Emergency Medicine

## 2022-01-23 ENCOUNTER — Telehealth: Payer: Self-pay | Admitting: Adult Health

## 2022-01-23 DIAGNOSIS — Z951 Presence of aortocoronary bypass graft: Secondary | ICD-10-CM | POA: Insufficient documentation

## 2022-01-23 DIAGNOSIS — R030 Elevated blood-pressure reading, without diagnosis of hypertension: Secondary | ICD-10-CM | POA: Diagnosis present

## 2022-01-23 DIAGNOSIS — Z85828 Personal history of other malignant neoplasm of skin: Secondary | ICD-10-CM | POA: Diagnosis not present

## 2022-01-23 DIAGNOSIS — I10 Essential (primary) hypertension: Secondary | ICD-10-CM | POA: Diagnosis not present

## 2022-01-23 NOTE — ED Provider Notes (Signed)
[facility] Emergency Department Provider Note MRN:  [id number]  Arrival date & time: 01/23/22     Chief Complaint   Hypertension   History of Present Illness   Katelyn [name] is a 81 y.o. year-old female with a history of hypertension presenting to the ED with chief complaint of hypertension.  Checked her blood pressure before bed and noticed that it was elevated.  Did not want to go to bed until the blood pressure went down and so she kept checking it but it kept being high.  Denies headache or vision change, no chest pain or shortness of breath, no abdominal pain, no numbness or weakness to the arms or legs, no complaints.  Review of Systems  A thorough review of systems was obtained and all systems are negative except as noted in the HPI and PMH.   Patient's Health History    Past Medical History:  Diagnosis Date   Anxiety state, unspecified    Atypical mole 2015   left ant thigh moderate   Basal cell carcinoma 10/17/2013   right side of neck tx with bx   Disorder of bone and cartilage, unspecified    Osteoarthrosis, unspecified whether generalized or localized, unspecified site    Other and unspecified hyperlipidemia    Palpitations    Shortness of breath    Unspecified essential hypertension     Past Surgical History:  Procedure Laterality Date   BILATERAL SALPINGOOPHORECTOMY  2001   BREAST CYST ASPIRATION  2001   BREAST EXCISIONAL BIOPSY Right 09/12/2015   BREAST LUMPECTOMY WITH RADIOACTIVE SEED LOCALIZATION Right 09/12/2015   Procedure: RIGHT BREAST LUMPECTOMY WITH RADIOACTIVE SEED LOCALIZATION;  Surgeon: Erroll Luna, MD;  Location: [facility];  Service: General;  Laterality: Right;   COLONOSCOPY  [id number]   LEFT HEART CATHETERIZATION WITH CORONARY ANGIOGRAM N/A 06/26/2014   Procedure: LEFT HEART CATHETERIZATION WITH CORONARY ANGIOGRAM;  Surgeon: Leonie Man, MD;  Location: [facility] CATH LAB;  Service: Cardiovascular;   Laterality: N/A;   TONSILLECTOMY  1957   TOTAL ABDOMINAL HYSTERECTOMY  2001    Family History  Problem Relation Age of Onset   Pneumonia Mother    Stroke Mother    Asthma Other    Allergies Other    Asthma Father    Colon cancer Neg Hx    Stomach cancer Neg Hx     Social History   Socioeconomic History   Marital status: Married    Spouse name: Not on file   Number of children: Not on file   Years of education: Not on file   Highest education level: Not on file  Occupational History   Not on file  Tobacco Use   Smoking status: Never   Smokeless tobacco: Never  Vaping Use   Vaping Use: Never used  Substance and Sexual Activity   Alcohol use: No   Drug use: No   Sexual activity: Not on file  Other Topics Concern   Not on file  Social History Narrative   Teacher - elementary school    Married    Two children    Three grandchildren       She likes to read    Social Determinants of Health   Financial Resource Strain: Low Risk  (05/23/2021)   Overall Financial Resource Strain (CARDIA)    Difficulty of Paying Living Expenses: Not hard at all  Food Insecurity: No Food Insecurity (05/23/2021)   Hunger Vital Sign    Worried About  Running Out of Food in the Last Year: Never true    [place] in the Last Year: Never true  Transportation Needs: No Transportation Needs (05/23/2021)   PRAPARE - Hydrologist (Medical): No    Lack of Transportation (Non-Medical): No  Physical Activity: Insufficiently Active (05/23/2021)   Exercise Vital Sign    Days of Exercise per Week: 3 days    Minutes of Exercise per Session: 30 min  Stress: No Stress Concern Present (05/23/2021)   [place]    Feeling of Stress : Not at all  Social Connections: [place] (05/23/2021)   Social Connection and Isolation Panel [NHANES]    Frequency of Communication with Friends and Family:  More than three times a week    Frequency of Social Gatherings with Friends and Family: More than three times a week    Attends Religious Services: More than 4 times per year    Active Member of [employer] or Organizations: Yes    Attends Music therapist: More than 4 times per year    Marital Status: Married  Human resources officer Violence: Not At Risk (05/23/2021)   Humiliation, Afraid, Rape, and Kick questionnaire    Fear of Current or Ex-Partner: No    Emotionally Abused: No    Physically Abused: No    Sexually Abused: No     Physical Exam   Vitals:   01/23/22 0138 01/23/22 0300  BP: (!) 191/105 (!) 188/89  Pulse: 69 73  Resp: 17 18  Temp: 97.8 F (36.6 C)   SpO2: 96% 100%    CONSTITUTIONAL: Well-appearing, NAD NEURO/PSYCH:  Alert and oriented x 3, normal and symmetric strength and sensation, normal coordination, normal speech EYES:  eyes equal and reactive ENT/NECK:  no LAD, no JVD CARDIO: Regular rate, well-perfused, normal S1 and S2 PULM:  CTAB no wheezing or rhonchi GI/GU:  non-distended, non-tender MSK/SPINE:  No gross deformities, no edema SKIN:  no rash, atraumatic   *Additional and/or pertinent findings included in MDM below  Diagnostic and Interventional Summary    EKG Interpretation  Date/Time:  January 23, 2022 at 3: 03: 29 Ventricular Rate:   69 PR Interval:   187 QRS Duration:  98 QT Interval: 396   QTC Calculation:425   R Axis:     Text Interpretation: Sinus rhythm       Labs Reviewed - No data to display  No orders to display    Medications - No data to display   Procedures  /  Critical Care Procedures  ED Course and Medical Decision Making  Initial Impression and Ddx This is a visit for asymptomatic hypertension.  With no symptoms, well-appearing on exam, blood pressure somewhat improving without intervention patient is appropriate for reassurance and PCP follow-up.  Past medical/surgical history that increases complexity of ED  encounter: Hypertension  Interpretation of Diagnostics I personally reviewed the EKG and my interpretation is as follows: Sinus rhythm without concerning ischemic features    Patient Reassessment and Ultimate Disposition/Management     Discharge  Patient management required discussion with the following services or consulting groups:  None  Complexity of Problems Addressed Acute complicated illness or Injury  Additional Data Reviewed and Analyzed Further history obtained from: Further history from spouse/family member  Additional Factors Impacting ED Encounter Risk None  Barth Kirks. [place] Small, [place] [email or web address]  Final Clinical Impressions(s) /  ED Diagnoses     ICD-10-CM   1. Asymptomatic hypertension  I10       ED Discharge Orders     None        Discharge Instructions Discussed with and Provided to Patient:    Discharge Instructions      You were evaluated in the Emergency Department and after careful evaluation, we did not find any emergent condition requiring admission or further testing in the hospital.  Your exam/testing today is overall reassuring.  EKG was normal.  Recommend follow-up with your primary care doctor to discuss your blood pressures.  Please return to the Emergency Department if you experience any worsening of your condition.   Thank you for allowing [place] to be a part of your care.      Maudie Flakes, MD 01/23/22 [phone]

## 2022-01-23 NOTE — Discharge Instructions (Signed)
You were evaluated in the Emergency Department and after careful evaluation, we did not find any emergent condition requiring admission or further testing in the hospital.  Your exam/testing today is overall reassuring.  EKG was normal.  Recommend follow-up with your primary care doctor to discuss your blood pressures.  Please return to the Emergency Department if you experience any worsening of your condition.   Thank you for allowing [place] to be a part of your care.

## 2022-01-23 NOTE — ED Triage Notes (Addendum)
Reports having a physical yesterday and had some HTN with sbp ~150.   Checked bp today and had bp of 184/110 and 175/88. Took an additional 1/2 of atenolol.tonight.   Denies headache, blurred vision or associated sx.

## 2022-01-23 NOTE — Telephone Encounter (Signed)
Pt call and stated she went to the ER last night about her BP and they told her to inform her doc pt have a appt on 01/27/22/mm

## 2022-01-27 ENCOUNTER — Encounter: Payer: Self-pay | Admitting: Adult Health

## 2022-01-27 ENCOUNTER — Ambulatory Visit (INDEPENDENT_AMBULATORY_CARE_PROVIDER_SITE_OTHER): Payer: PPO | Admitting: Adult Health

## 2022-01-27 VITALS — BP 130/80 | HR 70 | Temp 98.5°F | Ht 60.0 in | Wt 136.0 lb

## 2022-01-27 DIAGNOSIS — I10 Essential (primary) hypertension: Secondary | ICD-10-CM

## 2022-01-27 NOTE — Progress Notes (Signed)
Subjective:    Patient ID: Katelyn [name], female    DOB: 1940/10/10, 81 y.o.   MRN: [id number]  HPI 81 year old female who  has a past medical history of Anxiety state, unspecified, Atypical mole (2015), Basal cell carcinoma (10/17/2013), Disorder of bone and cartilage, unspecified, Osteoarthrosis, unspecified whether generalized or localized, unspecified site, Other and unspecified hyperlipidemia, Palpitations, Shortness of breath, and Unspecified essential hypertension.  She presents to the office today for follow up reagrding hypertension, she was seen in the urgency room 4 days ago when she noticed that her blood pressure was elevated before going to bed.  She denied headache, blurred vision, chest pain, or shortness of breath.  The ER her initial blood pressure was 191/105 and 188/89 a few hours later.  EKG was within normal limits.  Since her blood pressure was improving slightly without intervention she was discharged home and advised to follow-up with her PCP.  She reports that she has been checking her blood pressures at home has had readings between 106-138 over 80s.  She takes atenolol 25 mg tablet ad HCTZ 12.5 mg      Review of Systems See HPI   Past Medical History:  Diagnosis Date   Anxiety state, unspecified    Atypical mole 2015   left ant thigh moderate   Basal cell carcinoma 10/17/2013   right side of neck tx with bx   Disorder of bone and cartilage, unspecified    Osteoarthrosis, unspecified whether generalized or localized, unspecified site    Other and unspecified hyperlipidemia    Palpitations    Shortness of breath    Unspecified essential hypertension     Social History   Socioeconomic History   Marital status: Married    Spouse name: Not on file   Number of children: Not on file   Years of education: Not on file   Highest education level: Not on file  Occupational History   Not on file  Tobacco Use   Smoking status: Never   Smokeless  tobacco: Never  Vaping Use   Vaping Use: Never used  Substance and Sexual Activity   Alcohol use: No   Drug use: No   Sexual activity: Not on file  Other Topics Concern   Not on file  Social History Narrative   Teacher - elementary school    Married    Two children    Three grandchildren       She likes to read    Social Determinants of Health   Financial Resource Strain: Low Risk  (05/23/2021)   Overall Financial Resource Strain (CARDIA)    Difficulty of Paying Living Expenses: Not hard at all  Food Insecurity: No Food Insecurity (05/23/2021)   Hunger Vital Sign    Worried About Running Out of Food in the Last Year: Never true    Ran Out of Food in the Last Year: Never true  Transportation Needs: No Transportation Needs (05/23/2021)   PRAPARE - Hydrologist (Medical): No    Lack of Transportation (Non-Medical): No  Physical Activity: Insufficiently Active (05/23/2021)   Exercise Vital Sign    Days of Exercise per Week: 3 days    Minutes of Exercise per Session: 30 min  Stress: No Stress Concern Present (05/23/2021)   [place]    Feeling of Stress : Not at all  Social Connections: [place] (05/23/2021)   Social Connection and  Isolation Panel [NHANES]    Frequency of Communication with Friends and Family: More than three times a week    Frequency of Social Gatherings with Friends and Family: More than three times a week    Attends Religious Services: More than 4 times per year    Active Member of Clubs or Organizations: Yes    Attends Archivist Meetings: More than 4 times per year    Marital Status: Married  Human resources officer Violence: Not At Risk (05/23/2021)   Humiliation, Afraid, Rape, and Kick questionnaire    Fear of Current or Ex-Partner: No    Emotionally Abused: No    Physically Abused: No    Sexually Abused: No    Past Surgical History:   Procedure Laterality Date   BILATERAL SALPINGOOPHORECTOMY  2001   BREAST CYST ASPIRATION  2001   BREAST EXCISIONAL BIOPSY Right 09/12/2015   BREAST LUMPECTOMY WITH RADIOACTIVE SEED LOCALIZATION Right 09/12/2015   Procedure: RIGHT BREAST LUMPECTOMY WITH RADIOACTIVE SEED LOCALIZATION;  Surgeon: Erroll Luna, MD;  Location: [facility];  Service: General;  Laterality: Right;   COLONOSCOPY  [id number]   LEFT HEART CATHETERIZATION WITH CORONARY ANGIOGRAM N/A 06/26/2014   Procedure: LEFT HEART CATHETERIZATION WITH CORONARY ANGIOGRAM;  Surgeon: Leonie Man, MD;  Location: [facility] CATH LAB;  Service: Cardiovascular;  Laterality: N/A;   TONSILLECTOMY  1957   TOTAL ABDOMINAL HYSTERECTOMY  2001    Family History  Problem Relation Age of Onset   Pneumonia Mother    Stroke Mother    Asthma Other    Allergies Other    Asthma Father    Colon cancer Neg Hx    Stomach cancer Neg Hx     Allergies  Allergen Reactions   Penicillins     REACTION: rash   Tetracycline     REACTION: rash    Current Outpatient Medications on File Prior to Visit  Medication Sig Dispense Refill   Ascorbic Acid (VITAMIN C) 250 MG CHEW Chew 250 mg by mouth daily. (gummies)     aspirin 81 MG EC tablet Take 81 mg by mouth daily.       atenolol (TENORMIN) 25 MG tablet Take 1 tablet (25 mg total) by mouth daily. 90 tablet 3   Calcium-Phosphorus-Vitamin D (CALCIUM GUMMIES PO) Take 500 mg by mouth daily.     Cholecalciferol (VITAMIN D3) 2000 UNITS TABS Take 1 tablet by mouth daily.       Cyanocobalamin (VITAMIN B-12) 5000 MCG TBDP Take 1 tablet by mouth daily.      estradiol (VIVELLE-DOT) 0.05 MG/24HR patch APPLY 1 PATCH TOPICALLY TWICE A WEEK     estradiol (VIVELLE-DOT) 0.1 MG/24HR patch Apply 1 patch twice a week by transdermal route.     fluticasone (FLONASE) 50 MCG/ACT nasal spray Place 1 spray into both nostrils daily. 16 g 0   hydrochlorothiazide (HYDRODIURIL) 12.5 MG tablet Take 1 tablet by mouth once  daily 90 tablet 0   LUTEIN PO Take 1 tablet by mouth daily.     mirabegron ER (MYRBETRIQ) 25 MG TB24 tablet Take 1 tablet (25 mg total) by mouth daily. 14 tablet 0   Multiple Vitamins-Minerals (ICAPS AREDS 2 PO) Take by mouth.     potassium chloride SA (KLOR-CON M20) 20 MEQ tablet Take 1 tablet by mouth once daily 90 tablet 3   pravastatin (PRAVACHOL) 40 MG tablet TAKE 1 TABLET BY MOUTH AT BEDTIME 90 tablet 0   tretinoin (RETIN-A) 0.05 % cream Apply topically at  bedtime.     No current facility-administered medications on file prior to visit.    BP 130/80   Pulse 70   Temp 98.5 F (36.9 C) (Oral)   Ht 5' (1.524 m)   Wt 136 lb (61.7 kg)   SpO2 99%   BMI 26.56 kg/m       Objective:   Physical Exam Vitals and nursing note reviewed.  Constitutional:      Appearance: Normal appearance.  Cardiovascular:     Rate and Rhythm: Normal rate and regular rhythm.     Pulses: Normal pulses.     Heart sounds: Normal heart sounds.  Pulmonary:     Effort: Pulmonary effort is normal.     Breath sounds: Normal breath sounds.  Skin:    General: Skin is warm and dry.  Neurological:     Mental Status: She is alert and oriented to person, place, and time.  Psychiatric:        Mood and Affect: Mood normal.        Behavior: Behavior normal.        Thought Content: Thought content normal.        Judgment: Judgment normal.       Assessment & Plan:  1. Essential hypertension - Well controlled on current medication  - Continue to monitor at home   Dorothyann Peng, NP

## 2022-02-18 DIAGNOSIS — Z79890 Hormone replacement therapy (postmenopausal): Secondary | ICD-10-CM | POA: Diagnosis not present

## 2022-02-18 DIAGNOSIS — Z1231 Encounter for screening mammogram for malignant neoplasm of breast: Secondary | ICD-10-CM | POA: Diagnosis not present

## 2022-02-18 DIAGNOSIS — Z01419 Encounter for gynecological examination (general) (routine) without abnormal findings: Secondary | ICD-10-CM | POA: Diagnosis not present

## 2022-02-25 ENCOUNTER — Other Ambulatory Visit: Payer: Self-pay | Admitting: Adult Health

## 2022-02-25 DIAGNOSIS — E785 Hyperlipidemia, unspecified: Secondary | ICD-10-CM

## 2022-03-25 DIAGNOSIS — H353123 Nonexudative age-related macular degeneration, left eye, advanced atrophic without subfoveal involvement: Secondary | ICD-10-CM | POA: Diagnosis not present

## 2022-03-25 DIAGNOSIS — H35372 Puckering of macula, left eye: Secondary | ICD-10-CM | POA: Diagnosis not present

## 2022-03-25 DIAGNOSIS — H353112 Nonexudative age-related macular degeneration, right eye, intermediate dry stage: Secondary | ICD-10-CM | POA: Diagnosis not present

## 2022-04-18 ENCOUNTER — Other Ambulatory Visit: Payer: Self-pay | Admitting: Adult Health

## 2022-04-23 ENCOUNTER — Other Ambulatory Visit: Payer: Self-pay | Admitting: Adult Health

## 2022-05-19 ENCOUNTER — Other Ambulatory Visit: Payer: Self-pay | Admitting: Adult Health

## 2022-05-19 DIAGNOSIS — E785 Hyperlipidemia, unspecified: Secondary | ICD-10-CM

## 2022-05-27 ENCOUNTER — Ambulatory Visit (INDEPENDENT_AMBULATORY_CARE_PROVIDER_SITE_OTHER): Payer: PPO

## 2022-05-27 VITALS — Ht 60.0 in | Wt 133.0 lb

## 2022-05-27 DIAGNOSIS — Z0000 Encounter for general adult medical examination without abnormal findings: Secondary | ICD-10-CM

## 2022-05-27 NOTE — Patient Instructions (Addendum)
Katelyn [name] , Thank you for taking time to come for your Medicare Wellness Visit. I appreciate your ongoing commitment to your health goals. Please review the following plan we discussed and let me know if I can assist you in the future.   These are the goals we discussed:  Goals       Increase physical activity      Maintain current physical activity. Going to [employer].      Pharmacy Care Plan      CARE PLAN ENTRY  Current Barriers:  Chronic Disease Management support, education, and care coordination needs related to CAD, HTN, HLD, and Osteopenia  Pharmacist Clinical Goal(s):  Over next 6 months,  Blood pressure:  Maintain blood pressure within goal of your provider (130/80 or 140/90)  Maintain low salt diet.  Continue self health monitoring activities as directed today (blood pressure monitoring). High cholesterol: Cholesterol goals: Total Cholesterol goal under 200, Triglycerides goal under 150, HDL goal above 40 (men) or above 50 (women), LDL goal under 100.  Lipid Panel     Component Value Date/Time   CHOL 196 11/02/2018 1000   TRIG 147.0 11/02/2018 1000   HDL 52.20 11/02/2018 1000   CHOLHDL 4 11/02/2018 1000   VLDL 29.4 11/02/2018 1000   LDLCALC 114 (H) 11/02/2018 1000  Osteopenia prevent bone fractures.   Interventions: Comprehensive medication review performed. Blood pressure:  Discussed need to continue checking blood pressure at home.  Discussed diet modifications. DASH diet:  following a diet emphasizing fruits and vegetables and low-fat dairy products along with whole grains, fish, poultry, and nuts. Reducing red meats and sugars.  Exercising-   Reducing the amount of salt intake to '1500mg'$ /per day.  Recommend using a salt substitute to replace your salt if you need flavor.    Getting enough potassium in your diet equaling 3500-'5000mg'$ /day.  This helps to regulate BP by balancing out the effects of salt.   Weight reduction- We discussed losing 5-10% of body  weight Continue:  atenolol '50mg'$ , 0.5 tablets ('25mg'$ ) once daily  hydrochlorothiazide 12.'5mg'$ , 1 tablet once daily  High cholesterol/ CAD How to reduce cholesterol through diet/weight management and physical activity.    We discussed how a diet high in plant sterols (fruits/vegetables/nuts/whole grains/legumes) may reduce your cholesterol.  Encouraged increasing fiber to a daily intake of 10-25g/day  Continue:  pravastatin '40mg'$ , 1 tablet once daily at bedtime  aspirin '81mg'$ , 1 tablet once daily  Osteopenia (disorder of bone and cartilage)  Continue calcium gummies '500mg'$ , 2 gummies once daily  cholecalciferol (vitamin D3) 2000 units, 1 tablet once daily   estradiol (Vivelle-Dot) 0.'05mg'$ /24 hour patch, 1 patch twice weekly  Patient Self Care Activities:  Patient verbalizes understanding of plan to continue home blood pressure readings, Self administers medications as prescribed, Calls pharmacy for medication refills, and Calls provider office for new concerns or questions Pick up potassium chloride 42mq tablets when done taking current supply of potassium 137m.   Initial goal documentation       Stay Healthy (pt-stated)        This is a list of the screening recommended for you and due dates:  Health Maintenance  Topic Date Due   DTaP/Tdap/Td vaccine (2 - Tdap) 08/06/2019   COVID-19 Vaccine (3 - Pfizer risk series) 06/12/2022*   Flu Shot  08/23/2022*   Zoster (Shingles) Vaccine (1 of 2) 08/26/2022*   Medicare Annual Wellness Visit  05/28/2023   Pneumonia Vaccine  Completed   DEXA scan (bone density measurement)  Completed   HPV Vaccine  Aged Out  *Topic was postponed. The date shown is not the original due date.    Advanced directives: Please bring a copy of your health care power of attorney and living will to the office to be added to your chart at your convenience.   Conditions/risks identified: None  Next appointment: Follow up in one year for your annual wellness visit     Preventive Care 65 Years and Older, Female Preventive care refers to lifestyle choices and visits with your health care provider that can promote health and wellness. What does preventive care include? A yearly physical exam. This is also called an annual well check. Dental exams once or twice a year. Routine eye exams. Ask your health care provider how often you should have your eyes checked. Personal lifestyle choices, including: Daily care of your teeth and gums. Regular physical activity. Eating a healthy diet. Avoiding tobacco and drug use. Limiting alcohol use. Practicing safe sex. Taking low-dose aspirin every day. Taking vitamin and mineral supplements as recommended by your health care provider. What happens during an annual well check? The services and screenings done by your health care provider during your annual well check will depend on your age, overall health, lifestyle risk factors, and family history of disease. Counseling  Your health care provider may ask you questions about your: Alcohol use. Tobacco use. Drug use. Emotional well-being. Home and relationship well-being. Sexual activity. Eating habits. History of falls. Memory and ability to understand (cognition). Work and work Statistician. Reproductive health. Screening  You may have the following tests or measurements: Height, weight, and BMI. Blood pressure. Lipid and cholesterol levels. These may be checked every 5 years, or more frequently if you are over 28 years old. Skin check. Lung cancer screening. You may have this screening every year starting at age 76 if you have a 30-pack-year history of smoking and currently smoke or have quit within the past 15 years. Fecal occult blood test (FOBT) of the stool. You may have this test every year starting at age 57. Flexible sigmoidoscopy or colonoscopy. You may have a sigmoidoscopy every 5 years or a colonoscopy every 10 years starting at age  54. Hepatitis C blood test. Hepatitis B blood test. Sexually transmitted disease (STD) testing. Diabetes screening. This is done by checking your blood sugar (glucose) after you have not eaten for a while (fasting). You may have this done every 1-3 years. Bone density scan. This is done to screen for osteoporosis. You may have this done starting at age 34. Mammogram. This may be done every 1-2 years. Talk to your health care provider about how often you should have regular mammograms. Talk with your health care provider about your test results, treatment options, and if necessary, the need for more tests. Vaccines  Your health care provider may recommend certain vaccines, such as: Influenza vaccine. This is recommended every year. Tetanus, diphtheria, and acellular pertussis (Tdap, Td) vaccine. You may need a Td booster every 10 years. Zoster vaccine. You may need this after age 41. Pneumococcal 13-valent conjugate (PCV13) vaccine. One dose is recommended after age 58. Pneumococcal polysaccharide (PPSV23) vaccine. One dose is recommended after age 1. Talk to your health care provider about which screenings and vaccines you need and how often you need them. This information is not intended to replace advice given to you by your health care provider. Make sure you discuss any questions you have with your health care provider. Document Released:  06/07/2015 Document Revised: 01/29/2016 Document Reviewed: 03/12/2015 Elsevier Interactive Patient Education  2017 [place] Prevention in the Home Falls can cause injuries. They can happen to people of all ages. There are many things you can do to make your home safe and to help prevent falls. What can I do on the outside of my home? Regularly fix the edges of walkways and driveways and fix any cracks. Remove anything that might make you trip as you walk through a door, such as a raised step or threshold. Trim any bushes or trees on the  path to your home. Use bright outdoor lighting. Clear any walking paths of anything that might make someone trip, such as rocks or tools. Regularly check to see if handrails are loose or broken. Make sure that both sides of any steps have handrails. Any raised decks and porches should have guardrails on the edges. Have any leaves, snow, or ice cleared regularly. Use sand or salt on walking paths during winter. Clean up any spills in your garage right away. This includes oil or grease spills. What can I do in the bathroom? Use night lights. Install grab bars by the toilet and in the tub and shower. Do not use towel bars as grab bars. Use non-skid mats or decals in the tub or shower. If you need to sit down in the shower, use a plastic, non-slip stool. Keep the floor dry. Clean up any water that spills on the floor as soon as it happens. Remove soap buildup in the tub or shower regularly. Attach bath mats securely with double-sided non-slip rug tape. Do not have throw rugs and other things on the floor that can make you trip. What can I do in the bedroom? Use night lights. Make sure that you have a light by your bed that is easy to reach. Do not use any sheets or blankets that are too big for your bed. They should not hang down onto the floor. Have a firm chair that has side arms. You can use this for support while you get dressed. Do not have throw rugs and other things on the floor that can make you trip. What can I do in the kitchen? Clean up any spills right away. Avoid walking on wet floors. Keep items that you use a lot in easy-to-reach places. If you need to reach something above you, use a strong step stool that has a grab bar. Keep electrical cords out of the way. Do not use floor polish or wax that makes floors slippery. If you must use wax, use non-skid floor wax. Do not have throw rugs and other things on the floor that can make you trip. What can I do with my stairs? Do not  leave any items on the stairs. Make sure that there are handrails on both sides of the stairs and use them. Fix handrails that are broken or loose. Make sure that handrails are as long as the stairways. Check any carpeting to make sure that it is firmly attached to the stairs. Fix any carpet that is loose or worn. Avoid having throw rugs at the top or bottom of the stairs. If you do have throw rugs, attach them to the floor with carpet tape. Make sure that you have a light switch at the top of the stairs and the bottom of the stairs. If you do not have them, ask someone to add them for you. What else can I do to help prevent  falls? Wear shoes that: Do not have high heels. Have rubber bottoms. Are comfortable and fit you well. Are closed at the toe. Do not wear sandals. If you use a stepladder: Make sure that it is fully opened. Do not climb a closed stepladder. Make sure that both sides of the stepladder are locked into place. Ask someone to hold it for you, if possible. Clearly mark and make sure that you can see: Any grab bars or handrails. First and last steps. Where the edge of each step is. Use tools that help you move around (mobility aids) if they are needed. These include: Canes. Walkers. Scooters. Crutches. Turn on the lights when you go into a dark area. Replace any light bulbs as soon as they burn out. Set up your furniture so you have a clear path. Avoid moving your furniture around. If any of your floors are uneven, fix them. If there are any pets around you, be aware of where they are. Review your medicines with your doctor. Some medicines can make you feel dizzy. This can increase your chance of falling. Ask your doctor what other things that you can do to help prevent falls. This information is not intended to replace advice given to you by your health care provider. Make sure you discuss any questions you have with your health care provider. Document Released:  03/07/2009 Document Revised: 10/17/2015 Document Reviewed: 06/15/2014 Elsevier Interactive Patient Education  2017 [employer].

## 2022-05-27 NOTE — Progress Notes (Signed)
Subjective:   MATEA STANARD is a 82 y.o. female who presents for Medicare Annual (Subsequent) preventive examination.  Review of Systems    Virtual Visit via Telephone Note  I connected with  Christopher Hink Mccorry on 05/27/22 at 10:15 AM EST by telephone and verified that I am speaking with the correct person using two identifiers.  Location: Patient: Home Provider: Office Persons participating in the virtual visit: patient/Nurse Health Advisor   I discussed the limitations, risks, security and privacy concerns of performing an evaluation and management service by telephone and the availability of in person appointments. The patient expressed understanding and agreed to proceed.  Interactive audio and video telecommunications were attempted between this nurse and patient, however failed, due to patient having technical difficulties OR patient did not have access to video capability.  We continued and completed visit with audio only.  Some vital signs may be absent or patient reported.   Criselda Peaches, LPN  Cardiac Risk Factors include: advanced age (>8mn, >>55women);hypertension     Objective:    Today's Vitals   05/27/22 1020  Weight: 133 lb (60.3 kg)  Height: 5' (1.524 m)   Body mass index is 25.97 kg/m.     05/27/2022   10:27 AM 01/23/2022    1:37 AM 05/23/2021    2:10 PM 09/12/2015   11:39 AM 09/03/2015    2:47 PM 06/26/2014    3:26 AM 06/25/2014    8:56 PM  Advanced Directives  Does Patient Have a Medical Advance Directive? Yes No Yes Yes Yes Yes No  Type of [place]amed[place]  [place] Living will Living will Living will;Healthcare Power of Attorney   Does patient want to make changes to medical advance directive?   No - Patient declined No - Patient declined  No - Patient declined   Copy of [place] Chart? No - copy requested  No - copy requested   Yes   Would  patient like information on creating a medical advance directive?  No - Patient declined     No - patient declined information    Current Medications (verified) Outpatient Encounter Medications as of 05/27/2022  Medication Sig   Ascorbic Acid (VITAMIN C) 250 MG CHEW Chew 250 mg by mouth daily. (gummies)   aspirin 81 MG EC tablet Take 81 mg by mouth daily.     atenolol (TENORMIN) 25 MG tablet Take 1 tablet (25 mg total) by mouth daily.   Calcium-Phosphorus-Vitamin D (CALCIUM GUMMIES PO) Take 500 mg by mouth daily.   Cholecalciferol (VITAMIN D3) 2000 UNITS TABS Take 1 tablet by mouth daily.     Cyanocobalamin (VITAMIN B-12) 5000 MCG TBDP Take 1 tablet by mouth daily.    estradiol (VIVELLE-DOT) 0.05 MG/24HR patch APPLY 1 PATCH TOPICALLY TWICE A WEEK   estradiol (VIVELLE-DOT) 0.1 MG/24HR patch Apply 1 patch twice a week by transdermal route.   fluticasone (FLONASE) 50 MCG/ACT nasal spray Place 1 spray into both nostrils daily.   hydrochlorothiazide (HYDRODIURIL) 12.5 MG tablet Take 1 tablet by mouth once daily   LUTEIN PO Take 1 tablet by mouth daily.   mirabegron ER (MYRBETRIQ) 25 MG TB24 tablet Take 1 tablet (25 mg total) by mouth daily.   Multiple Vitamins-Minerals (ICAPS AREDS 2 PO) Take by mouth.   potassium chloride SA (KLOR-CON M20) 20 MEQ tablet Take 1 tablet by mouth once daily   pravastatin (PRAVACHOL) 40 MG tablet  TAKE 1 TABLET BY MOUTH AT BEDTIME   tretinoin (RETIN-A) 0.05 % cream Apply topically at bedtime.   No facility-administered encounter medications on file as of 05/27/2022.    Allergies (verified) Penicillins and Tetracycline   History: Past Medical History:  Diagnosis Date   Anxiety state, unspecified    Atypical mole 2015   left ant thigh moderate   Basal cell carcinoma 10/17/2013   right side of neck tx with bx   Disorder of bone and cartilage, unspecified    Osteoarthrosis, unspecified whether generalized or localized, unspecified site    Other and unspecified  hyperlipidemia    Palpitations    Shortness of breath    Unspecified essential hypertension    Past Surgical History:  Procedure Laterality Date   BILATERAL SALPINGOOPHORECTOMY  2001   BREAST CYST ASPIRATION  2001   BREAST EXCISIONAL BIOPSY Right 09/12/2015   BREAST LUMPECTOMY WITH RADIOACTIVE SEED LOCALIZATION Right 09/12/2015   Procedure: RIGHT BREAST LUMPECTOMY WITH RADIOACTIVE SEED LOCALIZATION;  Surgeon: Erroll Luna, MD;  Location: [facility];  Service: General;  Laterality: Right;   COLONOSCOPY  [id number]   LEFT HEART CATHETERIZATION WITH CORONARY ANGIOGRAM N/A 06/26/2014   Procedure: LEFT HEART CATHETERIZATION WITH CORONARY ANGIOGRAM;  Surgeon: Leonie Man, MD;  Location: [facility] CATH LAB;  Service: Cardiovascular;  Laterality: N/A;   TONSILLECTOMY  1957   TOTAL ABDOMINAL HYSTERECTOMY  2001   Family History  Problem Relation Age of Onset   Pneumonia Mother    Stroke Mother    Asthma Other    Allergies Other    Asthma Father    Colon cancer Neg Hx    Stomach cancer Neg Hx    Social History   Socioeconomic History   Marital status: Married    Spouse name: Not on file   Number of children: Not on file   Years of education: Not on file   Highest education level: Not on file  Occupational History   Not on file  Tobacco Use   Smoking status: Never   Smokeless tobacco: Never  Vaping Use   Vaping Use: Never used  Substance and Sexual Activity   Alcohol use: No   Drug use: No   Sexual activity: Not on file  Other Topics Concern   Not on file  Social History Narrative   Teacher - elementary school    Married    Two children    Three grandchildren       She likes to read    Social Determinants of Health   Financial Resource Strain: Low Risk  (05/27/2022)   Overall Financial Resource Strain (CARDIA)    Difficulty of Paying Living Expenses: Not hard at all  Food Insecurity: No Food Insecurity (05/27/2022)   Hunger Vital Sign    Worried About Running  Out of Food in the Last Year: Never true    [place] in the Last Year: Never true  Transportation Needs: No Transportation Needs (05/27/2022)   PRAPARE - Hydrologist (Medical): No    Lack of Transportation (Non-Medical): No  Physical Activity: Insufficiently Active (05/27/2022)   Exercise Vital Sign    Days of Exercise per Week: 2 days    Minutes of Exercise per Session: 30 min  Stress: No Stress Concern Present (05/27/2022)   [place]    Feeling of Stress : Not at all  Social Connections: [place] (05/27/2022)  Social Licensed conveyancer [NHANES]    Frequency of Communication with Friends and Family: More than three times a week    Frequency of Social Gatherings with Friends and Family: More than three times a week    Attends Religious Services: More than 4 times per year    Active Member of [employer] or Organizations: Yes    Attends Music therapist: More than 4 times per year    Marital Status: Married    Tobacco Counseling Counseling given: Not Answered   Clinical Intake:  Pre-visit preparation completed: No  Pain : No/denies pain     BMI - recorded: 25.97 Nutritional Status: BMI 25 -29 Overweight Nutritional Risks: None Diabetes: No  How often do you need to have someone help you when you read instructions, pamphlets, or other written materials from your doctor or pharmacy?: 1 - Never  Diabetic?  No  Interpreter Needed?: No  Information entered by :: Rolene Arbour LPN   Activities of Daily Living    05/27/2022   10:25 AM 01/21/2022    9:31 AM  In your present state of health, do you have any difficulty performing the following activities:  Hearing? 0 0  Vision? 0 0  Difficulty concentrating or making decisions? 0 0  Walking or climbing stairs? 0 0  Dressing or bathing? 0 0  Doing errands, shopping? 0 0  Preparing Food and  eating ? N   Using the Toilet? N   In the past six months, have you accidently leaked urine? N   Do you have problems with loss of bowel control? N   Managing your Medications? N   Managing your Finances? N   Housekeeping or managing your Housekeeping? N     Patient Care Team: Dorothyann Peng, NP as PCP - General (Family Medicine) Earnie Larsson, [facility] as Pharmacist (Pharmacist) Warren Danes, PA-C as Physician Assistant (Dermatology)  Indicate any recent Medical Services you may have received from other than [facility] providers in the past year (date may be approximate).     Assessment:   This is a routine wellness examination for [place].  Hearing/Vision screen Hearing Screening - Comments:: Denies hearing difficulties   Vision Screening - Comments:: Wears rx glasses - up to date with routine eye exams with  Dr Sabra Heck  Dietary issues and exercise activities discussed: Exercise limited by: None identified   Goals Addressed               This Visit's Progress     Stay Healthy (pt-stated)         Depression Screen    05/27/2022   10:25 AM 01/21/2022    9:31 AM 09/18/2021    1:36 PM 06/13/2021   11:12 AM 05/23/2021    2:05 PM 01/16/2021    8:57 AM 01/16/2020    8:41 AM  PHQ 2/9 Scores  PHQ - 2 Score 0 0 0 0 0 0 2  PHQ- 9 Score   '1 1   3    '$ Fall Risk    05/27/2022   10:26 AM 01/21/2022    9:30 AM 09/18/2021    1:36 PM 05/23/2021    2:07 PM 01/16/2021    8:55 AM  Fall Risk   Falls in the past year? 0 0 0 0 0  Number falls in past yr: 0 0 0 0 0  Injury with Fall? 0 0 0 0 0  Risk for fall due to : No Fall Risks No Fall  Risks No Fall Risks    Follow up Falls prevention discussed Falls evaluation completed Falls evaluation completed      [place]:  Any stairs in or around the home? Yes  If so, are there any without handrails? No  Home free of loose throw rugs in walkways, pet beds, electrical cords, etc? Yes  Adequate lighting  in your home to reduce risk of falls? Yes   ASSISTIVE DEVICES UTILIZED TO PREVENT FALLS:  Life alert? No  Use of a cane, walker or w/c? No  Grab bars in the bathroom? Yes  Shower chair or bench in shower? Yes  Elevated toilet seat or a handicapped toilet? Yes   TIMED UP AND GO:  Was the test performed? No . Audio Visit  Cognitive Function:        05/27/2022   10:27 AM 05/23/2021    2:08 PM  6CIT Screen  What Year? 0 points 0 points  What month? 0 points 0 points  What time? 0 points 0 points  Count back from 20 0 points 0 points  Months in reverse 0 points 0 points  Repeat phrase 0 points 0 points  Total Score 0 points 0 points    Immunizations Immunization History  Administered Date(s) Administered   Fluad Quad(high Dose 65+) 02/22/2019, 04/11/2020   Influenza Split 03/13/2011, 03/23/2012   Influenza Whole 02/29/2008, 03/08/2009   Influenza, High Dose Seasonal PF 03/21/2014, 03/26/2015, 03/03/2016, 03/12/2017, 02/03/2018   Influenza,inj,Quad PF,6+ Mos 03/01/2013   PFIZER(Purple Top)SARS-COV-2 Vaccination 06/14/2019, 07/05/2019   Pneumococcal Conjugate-13 11/27/2013   Pneumococcal Polysaccharide-23 08/05/2009   Td 08/05/2009   Zoster, Live 11/28/2012    TDAP status: Due, Education has been provided regarding the importance of this vaccine. Advised may receive this vaccine at local pharmacy or Health Dept. Aware to provide a copy of the vaccination record if obtained from local pharmacy or Health Dept. Verbalized acceptance and understanding.  Flu Vaccine status: Due, Education has been provided regarding the importance of this vaccine. Advised may receive this vaccine at local pharmacy or Health Dept. Aware to provide a copy of the vaccination record if obtained from local pharmacy or Health Dept. Verbalized acceptance and understanding.  Pneumococcal vaccine status: Up to date  Covid-19 vaccine status: Completed vaccines  Qualifies for Shingles Vaccine? Yes    Zostavax completed No   Shingrix Completed?: No.    Education has been provided regarding the importance of this vaccine. Patient has been advised to call insurance company to determine out of pocket expense if they have not yet received this vaccine. Advised may also receive vaccine at local pharmacy or Health Dept. Verbalized acceptance and understanding.  Screening Tests Health Maintenance  Topic Date Due   DTaP/Tdap/Td (2 - Tdap) 08/06/2019   COVID-19 Vaccine (3 - Pfizer risk series) 06/12/2022 (Originally 08/02/2019)   INFLUENZA VACCINE  08/23/2022 (Originally 12/23/2021)   Zoster Vaccines- Shingrix (1 of 2) 08/26/2022 (Originally 12/31/1959)   Medicare Annual Wellness (AWV)  05/28/2023   Pneumonia Vaccine 74+ Years old  Completed   DEXA SCAN  Completed   HPV VACCINES  Aged Out    Health Maintenance  Health Maintenance Due  Topic Date Due   DTaP/Tdap/Td (2 - Tdap) 08/06/2019    Colorectal cancer screening: No longer required.   Mammogram status: No longer required due to Age.  Bone Density status: Completed 06/30/11. Results reflect: Bone density results: OSTEOPOROSIS. Repeat every   years.  Lung Cancer Screening: (Low Dose  CT Chest recommended if Age 80-80 years, 30 pack-year currently smoking OR have quit w/in 15years.) does not qualify.     Additional Screening:  Hepatitis C Screening: does not qualify; Completed   Vision Screening: Recommended annual ophthalmology exams for early detection of glaucoma and other disorders of the eye. Is the patient up to date with their annual eye exam?  Yes  Who is the provider or what is the name of the office in which the patient attends annual eye exams? Dr Sabra Heck If pt is not established with a provider, would they like to be referred to a provider to establish care? No .   Dental Screening: Recommended annual dental exams for proper oral hygiene  Community Resource Referral / Chronic Care Management:  CRR required this visit?   No   CCM required this visit?  No     Plan:     I have personally reviewed and noted the following in the patient's chart:   Medical and social history Use of alcohol, tobacco or illicit drugs  Current medications and supplements including opioid prescriptions.  Functional ability and statusPatient is not currently taking opioid prescriptions. Nutritional status Physical activity Advanced directives List of other physicians Hospitalizations, surgeries, and ER visits in previous 12 months Vitals Screenings to include cognitive, depression, and falls Referrals and appointments  In addition, I have reviewed and discussed with patient certain preventive protocols, quality metrics, and best practice recommendations. A written personalized care plan for preventive services as well as general preventive health recommendations were provided to patient.     Criselda Peaches, LPN   11/23/6201   Nurse Notes: None

## 2022-07-23 ENCOUNTER — Other Ambulatory Visit: Payer: Self-pay | Admitting: Adult Health

## 2022-08-24 ENCOUNTER — Other Ambulatory Visit: Payer: Self-pay | Admitting: Adult Health

## 2022-08-24 DIAGNOSIS — E785 Hyperlipidemia, unspecified: Secondary | ICD-10-CM

## 2022-10-20 ENCOUNTER — Other Ambulatory Visit: Payer: Self-pay | Admitting: Adult Health

## 2022-11-18 ENCOUNTER — Other Ambulatory Visit: Payer: Self-pay | Admitting: Adult Health

## 2022-11-18 DIAGNOSIS — E785 Hyperlipidemia, unspecified: Secondary | ICD-10-CM

## 2022-12-28 ENCOUNTER — Ambulatory Visit: Payer: PPO | Admitting: Family Medicine

## 2022-12-28 ENCOUNTER — Encounter: Payer: Self-pay | Admitting: Family Medicine

## 2022-12-28 VITALS — BP 110/80 | HR 78 | Temp 98.3°F | Wt 136.0 lb

## 2022-12-28 DIAGNOSIS — N390 Urinary tract infection, site not specified: Secondary | ICD-10-CM | POA: Diagnosis not present

## 2022-12-28 DIAGNOSIS — R300 Dysuria: Secondary | ICD-10-CM

## 2022-12-28 LAB — POC URINALSYSI DIPSTICK (AUTOMATED)
Bilirubin, UA: NEGATIVE
Glucose, UA: NEGATIVE
Ketones, UA: NEGATIVE
Leukocytes, UA: NEGATIVE
Nitrite, UA: NEGATIVE
Protein, UA: NEGATIVE
Spec Grav, UA: 1.015 (ref 1.010–1.025)
Urobilinogen, UA: 0.2 E.U./dL
pH, UA: 5 (ref 5.0–8.0)

## 2022-12-28 MED ORDER — CIPROFLOXACIN HCL 500 MG PO TABS: 500.0000 mg | ORAL_TABLET | Freq: Two times a day (BID) | ORAL | 0 refills | Status: DC

## 2022-12-28 NOTE — Progress Notes (Signed)
   Subjective:    Patient ID: Katelyn [name], female    DOB: [birth year], 82 y.o.   MRN: [id number]  HPI Here for 3 days of urinary urgency and burning. Also some low back pain. No fever.    Review of Systems  Constitutional: Negative.   Respiratory: Negative.    Cardiovascular: Negative.   Gastrointestinal: Negative.   Genitourinary:  Positive for dysuria, frequency and urgency. Negative for flank pain and hematuria.       Objective:   Physical Exam Constitutional:      Appearance: Normal appearance. She is not ill-appearing.  Cardiovascular:     Rate and Rhythm: Normal rate and regular rhythm.     Pulses: Normal pulses.     Heart sounds: Normal heart sounds.  Pulmonary:     Effort: Pulmonary effort is normal.     Breath sounds: Normal breath sounds.  Abdominal:     General: Abdomen is flat. Bowel sounds are normal. There is no distension.     Palpations: Abdomen is soft. There is no mass.     Tenderness: There is no abdominal tenderness. There is no right CVA tenderness, guarding or rebound.     Hernia: No hernia is present.  Neurological:     Mental Status: She is alert.           Assessment & Plan:  UTI, treat with 7 days of Cipro. Culture the sample.  Gershon Crane, MD

## 2022-12-28 NOTE — Addendum Note (Signed)
Addended by: Carola Rhine on: 12/28/2022 04:35 PM   Modules accepted: Orders

## 2022-12-31 ENCOUNTER — Telehealth: Payer: Self-pay | Admitting: Adult Health

## 2022-12-31 NOTE — Telephone Encounter (Signed)
Pt called, returning CMA's call regarding labs. CMA was with a patient. Pt asked that CMA call back at her earliest convenience. 

## 2022-12-31 NOTE — Telephone Encounter (Signed)
Left message to return phone call.

## 2023-01-01 NOTE — Telephone Encounter (Signed)
Patient notified of update  and verbalized understanding. 

## 2023-01-07 ENCOUNTER — Encounter (INDEPENDENT_AMBULATORY_CARE_PROVIDER_SITE_OTHER): Payer: Self-pay

## 2023-01-13 DIAGNOSIS — L814 Other melanin hyperpigmentation: Secondary | ICD-10-CM | POA: Diagnosis not present

## 2023-01-13 DIAGNOSIS — D1801 Hemangioma of skin and subcutaneous tissue: Secondary | ICD-10-CM | POA: Diagnosis not present

## 2023-01-13 DIAGNOSIS — Z411 Encounter for cosmetic surgery: Secondary | ICD-10-CM | POA: Diagnosis not present

## 2023-01-13 DIAGNOSIS — L821 Other seborrheic keratosis: Secondary | ICD-10-CM | POA: Diagnosis not present

## 2023-01-13 DIAGNOSIS — D225 Melanocytic nevi of trunk: Secondary | ICD-10-CM | POA: Diagnosis not present

## 2023-01-13 DIAGNOSIS — L578 Other skin changes due to chronic exposure to nonionizing radiation: Secondary | ICD-10-CM | POA: Diagnosis not present

## 2023-01-20 DIAGNOSIS — D485 Neoplasm of uncertain behavior of skin: Secondary | ICD-10-CM | POA: Diagnosis not present

## 2023-01-20 DIAGNOSIS — L570 Actinic keratosis: Secondary | ICD-10-CM | POA: Diagnosis not present

## 2023-01-20 DIAGNOSIS — D1801 Hemangioma of skin and subcutaneous tissue: Secondary | ICD-10-CM | POA: Diagnosis not present

## 2023-01-20 DIAGNOSIS — L821 Other seborrheic keratosis: Secondary | ICD-10-CM | POA: Diagnosis not present

## 2023-01-20 DIAGNOSIS — L578 Other skin changes due to chronic exposure to nonionizing radiation: Secondary | ICD-10-CM | POA: Diagnosis not present

## 2023-01-20 DIAGNOSIS — L814 Other melanin hyperpigmentation: Secondary | ICD-10-CM | POA: Diagnosis not present

## 2023-01-20 DIAGNOSIS — D0472 Carcinoma in situ of skin of left lower limb, including hip: Secondary | ICD-10-CM | POA: Diagnosis not present

## 2023-01-20 DIAGNOSIS — D229 Melanocytic nevi, unspecified: Secondary | ICD-10-CM | POA: Diagnosis not present

## 2023-01-22 ENCOUNTER — Other Ambulatory Visit: Payer: Self-pay | Admitting: Adult Health

## 2023-01-22 DIAGNOSIS — I10 Essential (primary) hypertension: Secondary | ICD-10-CM

## 2023-01-26 ENCOUNTER — Encounter: Payer: Self-pay | Admitting: Adult Health

## 2023-01-26 ENCOUNTER — Ambulatory Visit (INDEPENDENT_AMBULATORY_CARE_PROVIDER_SITE_OTHER): Payer: PPO | Admitting: Adult Health

## 2023-01-26 VITALS — BP 120/80 | HR 65 | Temp 97.9°F | Ht 60.0 in | Wt 135.0 lb

## 2023-01-26 DIAGNOSIS — E785 Hyperlipidemia, unspecified: Secondary | ICD-10-CM | POA: Diagnosis not present

## 2023-01-26 DIAGNOSIS — I10 Essential (primary) hypertension: Secondary | ICD-10-CM | POA: Diagnosis not present

## 2023-01-26 DIAGNOSIS — I2510 Atherosclerotic heart disease of native coronary artery without angina pectoris: Secondary | ICD-10-CM

## 2023-01-26 DIAGNOSIS — Z0000 Encounter for general adult medical examination without abnormal findings: Secondary | ICD-10-CM

## 2023-01-26 DIAGNOSIS — M8589 Other specified disorders of bone density and structure, multiple sites: Secondary | ICD-10-CM | POA: Diagnosis not present

## 2023-01-26 DIAGNOSIS — N3281 Overactive bladder: Secondary | ICD-10-CM | POA: Diagnosis not present

## 2023-01-26 LAB — COMPREHENSIVE METABOLIC PANEL
ALT: 20 U/L (ref 0–35)
AST: 19 U/L (ref 0–37)
Albumin: 4.1 g/dL (ref 3.5–5.2)
Alkaline Phosphatase: 53 U/L (ref 39–117)
BUN: 15 mg/dL (ref 6–23)
CO2: 29 meq/L (ref 19–32)
Calcium: 9.4 mg/dL (ref 8.4–10.5)
Chloride: 103 meq/L (ref 96–112)
Creatinine, Ser: 0.69 mg/dL (ref 0.40–1.20)
GFR: 81.59 mL/min (ref >60.00)
Glucose, Bld: 87 mg/dL (ref 70–99)
Potassium: 4.1 meq/L (ref 3.5–5.1)
Sodium: 140 meq/L (ref 135–145)
Total Bilirubin: 0.6 mg/dL (ref 0.2–1.2)
Total Protein: 7.1 g/dL (ref 6.0–8.3)

## 2023-01-26 LAB — LIPID PANEL
Cholesterol: 171 mg/dL (ref 0–200)
HDL: 40.2 mg/dL (ref >39.00)
LDL Cholesterol: 98 mg/dL (ref 0–99)
NonHDL: 130.76
Total CHOL/HDL Ratio: 4
Triglycerides: 166 mg/dL — ABNORMAL HIGH (ref 0.0–149.0)
VLDL: 33.2 mg/dL (ref 0.0–40.0)

## 2023-01-26 LAB — CBC WITH DIFFERENTIAL/PLATELET
Basophils Absolute: 0 10*3/uL (ref 0.0–0.1)
Basophils Relative: 0.5 % (ref 0.0–3.0)
Eosinophils Absolute: 0.1 10*3/uL (ref 0.0–0.7)
Eosinophils Relative: 2.1 % (ref 0.0–5.0)
HCT: 42.3 % (ref 36.0–46.0)
Hemoglobin: 14.1 g/dL (ref 12.0–15.0)
Lymphocytes Relative: 27.4 % (ref 12.0–46.0)
Lymphs Abs: 1.9 10*3/uL (ref 0.7–4.0)
MCHC: 33.3 g/dL (ref 30.0–36.0)
MCV: 90 fl (ref 78.0–100.0)
Monocytes Absolute: 0.5 10*3/uL (ref 0.1–1.0)
Monocytes Relative: 7 % (ref 3.0–12.0)
Neutro Abs: 4.5 10*3/uL (ref 1.4–7.7)
Neutrophils Relative %: 63 % (ref 43.0–77.0)
Platelets: 270 10*3/uL (ref 150.0–400.0)
RBC: 4.69 Mil/uL (ref 3.87–5.11)
RDW: 12.8 % (ref 11.5–15.5)
WBC: 7.1 10*3/uL (ref 4.0–10.5)

## 2023-01-26 MED ORDER — POTASSIUM CHLORIDE CRYS ER 20 MEQ PO TBCR: 20.0000 meq | EXTENDED_RELEASE_TABLET | Freq: Every day | ORAL | 3 refills | Status: DC

## 2023-01-26 MED ORDER — PRAVASTATIN SODIUM 40 MG PO TABS: ORAL_TABLET | ORAL | 3 refills | Status: AC

## 2023-01-26 NOTE — Progress Notes (Signed)
Subjective:    Patient ID: Katelyn [name], female    DOB: [birth year], 82 y.o.   MRN: [id number]  HPI  Patient presents for yearly preventative medicine examination. She is a pleasant 82 year old female who  has a past medical history of Anxiety state, unspecified, Atypical mole (2015), Basal cell carcinoma (10/17/2013), Disorder of bone and cartilage, unspecified, Osteoarthrosis, unspecified whether generalized or localized, unspecified site, Other and unspecified hyperlipidemia, Palpitations, Shortness of breath, and Unspecified essential hypertension.  Hypertension -managed with atenolol 25 mg daily and HCTZ 12.5 mg daily.  She does monitor her blood pressures at home with readings in the 120s over 70s to 80s.  She denies dizziness, lightheadedness, chest pain, or shortness of breath. She took her medication just prior to arrival.  BP Readings from Last 3 Encounters:  01/26/23 120/80  12/28/22 110/80  01/27/22 130/80    Hyperlipidemia/CAD-managed with pravastatin 40 mg at bedtime and aspirin 81 mg daily.  She denies myalgia or fatigue.  In the past she has failed atorvastatin which caused myalgia. Has not had any CP, DOE, or dyspnea  Lab Results  Component Value Date   CHOL 200 01/21/2022   HDL 49.00 01/21/2022   LDLCALC 112 (H) 01/21/2022   TRIG 193.0 (H) 01/21/2022   CHOLHDL 4 01/21/2022   Osteopenia-takes vitamin D and calcium daily.  Also wears on estradiol 0.05 mg/24-hour patch, 1 patch twice weekly that is prescribed by GYN. Her las bone density screen was in 2013.   OAB - reports ongoing issues with urinary urgency and frequency. This has been going on quite a while. Denies dysuria or hematuria.   All immunizations and health maintenance protocols were reviewed with the patient and needed orders were placed. Advises shingles vaccination at pharmacy. Refused influenza   Appropriate screening laboratory values were ordered for the patient including screening of  hyperlipidemia, renal function and hepatic function.   Medication reconciliation,  past medical history, social history, problem list and allergies were reviewed in detail with the patient  Goals were established with regard to weight loss, exercise, and  diet in compliance with medications Wt Readings from Last 3 Encounters:  01/26/23 135 lb (61.2 kg)  12/28/22 136 lb (61.7 kg)  05/27/22 133 lb (60.3 kg)   Review of Systems  Constitutional: Negative.   HENT: Negative.    Eyes: Negative.   Respiratory: Negative.    Cardiovascular: Negative.   Gastrointestinal: Negative.   Endocrine: Negative.   Genitourinary: Negative.   Musculoskeletal: Negative.   Skin: Negative.   Allergic/Immunologic: Negative.   Neurological: Negative.   Hematological: Negative.   Psychiatric/Behavioral: Negative.     Past Medical History:  Diagnosis Date   Anxiety state, unspecified    Atypical mole 2015   left ant thigh moderate   Basal cell carcinoma 10/17/2013   right side of neck tx with bx   Disorder of bone and cartilage, unspecified    Osteoarthrosis, unspecified whether generalized or localized, unspecified site    Other and unspecified hyperlipidemia    Palpitations    Shortness of breath    Unspecified essential hypertension     Social History   Socioeconomic History   Marital status: Married    Spouse name: Not on file   Number of children: Not on file   Years of education: Not on file   Highest education level: Not on file  Occupational History   Not on file  Tobacco Use   Smoking status: Never  Smokeless tobacco: Never  Vaping Use   Vaping status: Never Used  Substance and Sexual Activity   Alcohol use: No   Drug use: No   Sexual activity: Not on file  Other Topics Concern   Not on file  Social History Narrative   Teacher - elementary school    Married    Two children    Three grandchildren       She likes to read    Social Determinants of Health   Financial  Resource Strain: Low Risk  (05/27/2022)   Overall Financial Resource Strain (CARDIA)    Difficulty of Paying Living Expenses: Not hard at all  Food Insecurity: No Food Insecurity (05/27/2022)   Hunger Vital Sign    Worried About Running Out of Food in the Last Year: Never true    Ran Out of Food in the Last Year: Never true  Transportation Needs: No Transportation Needs (05/27/2022)   PRAPARE - Administrator, Civil Service (Medical): No    Lack of Transportation (Non-Medical): No  Physical Activity: Insufficiently Active (05/27/2022)   Exercise Vital Sign    Days of Exercise per Week: 2 days    Minutes of Exercise per Session: 30 min  Stress: No Stress Concern Present (05/27/2022)   [employer] of Occupational Health - Occupational Stress Questionnaire    Feeling of Stress : Not at all  Social Connections: Socially Integrated (05/27/2022)   Social Connection and Isolation Panel [NHANES]    Frequency of Communication with Friends and Family: More than three times a week    Frequency of Social Gatherings with Friends and Family: More than three times a week    Attends Religious Services: More than 4 times per year    Active Member of Clubs or Organizations: Yes    Attends Engineer, structural: More than 4 times per year    Marital Status: Married  Catering manager Violence: Not At Risk (05/27/2022)   Humiliation, Afraid, Rape, and Kick questionnaire    Fear of Current or Ex-Partner: No    Emotionally Abused: No    Physically Abused: No    Sexually Abused: No    Past Surgical History:  Procedure Laterality Date   BILATERAL SALPINGOOPHORECTOMY  2001   BREAST CYST ASPIRATION  2001   BREAST EXCISIONAL BIOPSY Right 09/12/2015   BREAST LUMPECTOMY WITH RADIOACTIVE SEED LOCALIZATION Right 09/12/2015   Procedure: RIGHT BREAST LUMPECTOMY WITH RADIOACTIVE SEED LOCALIZATION;  Surgeon: Harriette Bouillon, MD;  Location:  SURGERY CENTER;  Service: General;  Laterality:  Right;   COLONOSCOPY  [id number]   LEFT HEART CATHETERIZATION WITH CORONARY ANGIOGRAM N/A 06/26/2014   Procedure: LEFT HEART CATHETERIZATION WITH CORONARY ANGIOGRAM;  Surgeon: Marykay Lex, MD;  Location: [facility] CATH LAB;  Service: Cardiovascular;  Laterality: N/A;   TONSILLECTOMY  1957   TOTAL ABDOMINAL HYSTERECTOMY  2001    Family History  Problem Relation Age of Onset   Pneumonia Mother    Stroke Mother    Asthma Other    Allergies Other    Asthma Father    Colon cancer Neg Hx    Stomach cancer Neg Hx     Allergies  Allergen Reactions   Penicillins     REACTION: rash   Tetracycline     REACTION: rash    Current Outpatient Medications on File Prior to Visit  Medication Sig Dispense Refill   Ascorbic Acid (VITAMIN C) 250 MG CHEW Chew 250 mg by mouth daily. (gummies)  aspirin 81 MG EC tablet Take 81 mg by mouth daily.       atenolol (TENORMIN) 25 MG tablet Take 1 tablet by mouth once daily 90 tablet 0   Calcium-Phosphorus-Vitamin D (CALCIUM GUMMIES PO) Take 500 mg by mouth daily.     Cholecalciferol (VITAMIN D3) 2000 UNITS TABS Take 1 tablet by mouth daily.       Cyanocobalamin (VITAMIN B-12) 5000 MCG TBDP Take 1 tablet by mouth daily.      estradiol (VIVELLE-DOT) 0.05 MG/24HR patch APPLY 1 PATCH TOPICALLY TWICE A WEEK     estradiol (VIVELLE-DOT) 0.1 MG/24HR patch Apply 1 patch twice a week by transdermal route.     fluorouracil (EFUDEX) 5 % cream Apply topically 2 (two) times daily.     fluticasone (FLONASE) 50 MCG/ACT nasal spray Place 1 spray into both nostrils daily. 16 g 0   hydrochlorothiazide (HYDRODIURIL) 12.5 MG tablet Take 1 tablet by mouth once daily 90 tablet 0   LUTEIN PO Take 1 tablet by mouth daily.     mirabegron ER (MYRBETRIQ) 25 MG TB24 tablet Take 1 tablet (25 mg total) by mouth daily. 14 tablet 0   Multiple Vitamins-Minerals (ICAPS AREDS 2 PO) Take by mouth.     potassium chloride SA (KLOR-CON M20) 20 MEQ tablet Take 1 tablet by mouth once daily 90 tablet  3   pravastatin (PRAVACHOL) 40 MG tablet TAKE 1 TABLET BY MOUTH AT BEDTIME 90 tablet 0   tretinoin (RETIN-A) 0.05 % cream Apply topically at bedtime.     No current facility-administered medications on file prior to visit.    BP 120/80   Pulse 65   Temp 97.9 F (36.6 C) (Oral)   Ht 5' (1.524 m)   Wt 135 lb (61.2 kg)   SpO2 97%   BMI 26.37 kg/m       Objective:   Physical Exam Vitals and nursing note reviewed.  Constitutional:      General: She is not in acute distress.    Appearance: Normal appearance. She is not ill-appearing.  HENT:     Head: Normocephalic and atraumatic.     Right Ear: Tympanic membrane, ear canal and external ear normal. There is no impacted cerumen.     Left Ear: Tympanic membrane, ear canal and external ear normal. There is no impacted cerumen.     Nose: Nose normal. No congestion or rhinorrhea.     Mouth/Throat:     Mouth: Mucous membranes are moist.     Pharynx: Oropharynx is clear.  Eyes:     Extraocular Movements: Extraocular movements intact.     Conjunctiva/sclera: Conjunctivae normal.     Pupils: Pupils are equal, round, and reactive to light.  Neck:     Vascular: No carotid bruit.  Cardiovascular:     Rate and Rhythm: Normal rate and regular rhythm.     Pulses: Normal pulses.     Heart sounds: No murmur heard.    No friction rub. No gallop.  Pulmonary:     Effort: Pulmonary effort is normal.     Breath sounds: Normal breath sounds.  Abdominal:     General: Abdomen is flat. Bowel sounds are normal. There is no distension.     Palpations: Abdomen is soft. There is no mass.     Tenderness: There is no abdominal tenderness. There is no guarding or rebound.     Hernia: No hernia is present.  Musculoskeletal:        General: Normal range  of motion.     Cervical back: Normal range of motion and neck supple.  Lymphadenopathy:     Cervical: No cervical adenopathy.  Skin:    General: Skin is warm and dry.     Capillary Refill: Capillary  refill takes less than 2 seconds.  Neurological:     General: No focal deficit present.     Mental Status: She is alert and oriented to person, place, and time.  Psychiatric:        Mood and Affect: Mood normal.        Behavior: Behavior normal.        Thought Content: Thought content normal.        Judgment: Judgment normal.       Assessment & Plan:  1. Routine general medical examination at a health care facility Today patient counseled on age appropriate routine health concerns for screening and prevention, each reviewed and up to date or declined. Immunizations reviewed and up to date or declined. Labs ordered and reviewed. Risk factors for depression reviewed and negative. Hearing function and visual acuity are intact. ADLs screened and addressed as needed. Functional ability and level of safety reviewed and appropriate. Education, counseling and referrals performed based on assessed risks today. Patient provided with a copy of personalized plan for preventive services. - Continue to stay active and eat healthy  - Follow up in one year or sooner if needed  2. Essential hypertension - Well controlled. No change in medication  - CBC with Differential/Platelet; Future - Comprehensive metabolic panel; Future - Lipid panel; Future - TSH; Future - potassium chloride SA (KLOR-CON M20) 20 MEQ tablet; Take 1 tablet (20 mEq total) by mouth daily.  Dispense: 90 tablet; Refill: 3  3. Coronary artery disease involving native coronary artery of native heart without angina pectoris - Continue with stain and asa  - CBC with Differential/Platelet; Future - Comprehensive metabolic panel; Future - Lipid panel; Future - TSH; Future - pravastatin (PRAVACHOL) 40 MG tablet; TAKE 1 TABLET BY MOUTH AT BEDTIME  Dispense: 90 tablet; Refill: 3  4. Osteopenia of multiple sites - Continue Vitamin D. Will order bone density  - CBC with Differential/Platelet; Future - Comprehensive metabolic panel;  Future - Lipid panel; Future - TSH; Future - VITAMIN D 25 Hydroxy (Vit-D Deficiency, Fractures); Future - DG Bone Density; Future  5. OAB (overactive bladder) - Does not want medication at this time  - CBC with Differential/Platelet; Future - Comprehensive metabolic panel; Future - Lipid panel; Future - TSH; Future  6. Dyslipidemia  - pravastatin (PRAVACHOL) 40 MG tablet; TAKE 1 TABLET BY MOUTH AT BEDTIME  Dispense: 90 tablet; Refill: 3  Shirline Frees, NP

## 2023-01-26 NOTE — Patient Instructions (Signed)
It was great seeing you today   We will follow up with you regarding your lab work   Please let me know if you need anything   

## 2023-01-26 NOTE — Addendum Note (Signed)
Addended by: Marian Sorrow D on: 01/26/2023 10:54 AM   Modules accepted: Orders

## 2023-01-27 LAB — TSH: TSH: 1.17 u[IU]/mL (ref 0.35–5.50)

## 2023-01-27 LAB — VITAMIN D 25 HYDROXY (VIT D DEFICIENCY, FRACTURES): VITD: 56.07 ng/mL (ref 30.00–100.00)

## 2023-02-01 DIAGNOSIS — D099 Carcinoma in situ, unspecified: Secondary | ICD-10-CM | POA: Diagnosis not present

## 2023-03-10 DIAGNOSIS — Z79890 Hormone replacement therapy (postmenopausal): Secondary | ICD-10-CM | POA: Diagnosis not present

## 2023-03-10 DIAGNOSIS — Z01419 Encounter for gynecological examination (general) (routine) without abnormal findings: Secondary | ICD-10-CM | POA: Diagnosis not present

## 2023-03-10 DIAGNOSIS — Z780 Asymptomatic menopausal state: Secondary | ICD-10-CM | POA: Diagnosis not present

## 2023-03-10 DIAGNOSIS — Z1231 Encounter for screening mammogram for malignant neoplasm of breast: Secondary | ICD-10-CM | POA: Diagnosis not present

## 2023-03-31 DIAGNOSIS — H353123 Nonexudative age-related macular degeneration, left eye, advanced atrophic without subfoveal involvement: Secondary | ICD-10-CM | POA: Diagnosis not present

## 2023-04-19 ENCOUNTER — Other Ambulatory Visit: Payer: Self-pay | Admitting: Adult Health

## 2023-04-19 DIAGNOSIS — I10 Essential (primary) hypertension: Secondary | ICD-10-CM

## 2023-06-02 ENCOUNTER — Ambulatory Visit: Payer: PPO

## 2023-06-02 VITALS — Ht 60.0 in | Wt 130.0 lb

## 2023-06-02 DIAGNOSIS — Z0000 Encounter for general adult medical examination without abnormal findings: Secondary | ICD-10-CM | POA: Diagnosis not present

## 2023-06-02 NOTE — Patient Instructions (Addendum)
 Katelyn [name] , Thank you for taking time to come for your Medicare Wellness Visit. I appreciate your ongoing commitment to your health goals. Please review the following plan we discussed and let me know if I can assist you in the future.   Referrals/Orders/Follow-Ups/Clinician Recommendations:   This is a list of the screening recommended for you and due dates:  Health Maintenance  Topic Date Due   Zoster (Shingles) Vaccine (1 of 2) 12/31/1959   COVID-19 Vaccine (3 - Pfizer risk series) 08/02/2019   Flu Shot  12/24/2022   Medicare Annual Wellness Visit  06/01/2024   DTaP/Tdap/Td vaccine (3 - Td or Tdap) 06/24/2030   Pneumonia Vaccine  Completed   DEXA scan (bone density measurement)  Completed   HPV Vaccine  Aged Out   Hepatitis C Screening  Discontinued    Advanced directives: (Copy Requested) Please bring a copy of your health care power of attorney and living will to the office to be added to your chart at your convenience.  Next Medicare Annual Wellness Visit scheduled for next year: Yes

## 2023-06-02 NOTE — Progress Notes (Signed)
 Subjective:   Katelyn [name] is a 83 y.o. female who presents for Medicare Annual (Subsequent) preventive examination.  Visit Complete: Virtual I connected with  Astra L Trueba on 06/02/23 by a audio enabled telemedicine application and verified that I am speaking with the correct person using two identifiers.  Patient Location: Home  Provider Location: Home Office  I discussed the limitations of evaluation and management by telemedicine. The patient expressed understanding and agreed to proceed.  Vital Signs: Because this visit was a virtual/telehealth visit, some criteria may be missing or patient reported. Any vitals not documented were not able to be obtained and vitals that have been documented are patient reported.    Cardiac Risk Factors include: advanced age (>81men, >8 women);hypertension     Objective:    Today's Vitals   06/02/23 1006  Weight: 130 lb (59 kg)  Height: 5' (1.524 m)   Body mass index is 25.39 kg/m.     06/02/2023   10:12 AM 05/27/2022   10:27 AM 01/23/2022    1:37 AM 05/23/2021    2:10 PM 09/12/2015   11:39 AM 09/03/2015    2:47 PM 06/26/2014    3:26 AM  Advanced Directives  Does Patient Have a Medical Advance Directive? Yes Yes No Yes Yes Yes Yes  Type of Estate Agent of [place];Living will Healthcare Power of [place];Living will  Healthcare Power of [place];Living will Living will Living will Living will;Healthcare Power of Attorney  Does patient want to make changes to medical advance directive?    No - Patient declined No - Patient declined  No - Patient declined  Copy of Healthcare Power of Attorney in Chart? No - copy requested No - copy requested  No - copy requested   Yes  Would patient like information on creating a medical advance directive?   No - Patient declined        Current Medications (verified) Outpatient Encounter Medications as of 06/02/2023  Medication Sig   Ascorbic Acid (VITAMIN C) 250 MG CHEW  Chew 250 mg by mouth daily. (gummies)   aspirin  81 MG EC tablet Take 81 mg by mouth daily.     atenolol  (TENORMIN ) 25 MG tablet Take 1 tablet by mouth once daily   Calcium -Phosphorus-Vitamin D  (CALCIUM  GUMMIES PO) Take 500 mg by mouth daily.   Cholecalciferol (VITAMIN D3) 2000 UNITS TABS Take 1 tablet by mouth daily.     Cyanocobalamin (VITAMIN B-12) 5000 MCG TBDP Take 1 tablet by mouth daily.    estradiol (VIVELLE-DOT) 0.05 MG/24HR patch APPLY 1 PATCH TOPICALLY TWICE A WEEK   estradiol (VIVELLE-DOT) 0.1 MG/24HR patch Apply 1 patch twice a week by transdermal route.   fluorouracil (EFUDEX) 5 % cream Apply topically 2 (two) times daily.   fluticasone  (FLONASE ) 50 MCG/ACT nasal spray Place 1 spray into both nostrils daily.   hydrochlorothiazide  (HYDRODIURIL ) 12.5 MG tablet Take 1 tablet by mouth once daily   LUTEIN PO Take 1 tablet by mouth daily.   Multiple Vitamins-Minerals (ICAPS AREDS 2 PO) Take by mouth.   potassium chloride  SA (KLOR-CON  M20) 20 MEQ tablet Take 1 tablet (20 mEq total) by mouth daily.   pravastatin  (PRAVACHOL ) 40 MG tablet TAKE 1 TABLET BY MOUTH AT BEDTIME   tretinoin  (RETIN-A ) 0.05 % cream Apply topically at bedtime.   No facility-administered encounter medications on file as of 06/02/2023.    Allergies (verified) Penicillins and Tetracycline   History: Past Medical History:  Diagnosis Date   Anxiety state,  unspecified    Atypical mole 2015   left ant thigh moderate   Basal cell carcinoma 10/17/2013   right side of neck tx with bx   Disorder of bone and cartilage, unspecified    Osteoarthrosis, unspecified whether generalized or localized, unspecified site    Other and unspecified hyperlipidemia    Palpitations    Shortness of breath    Unspecified essential hypertension    Past Surgical History:  Procedure Laterality Date   BILATERAL SALPINGOOPHORECTOMY  2001   BREAST CYST ASPIRATION  2001   BREAST EXCISIONAL BIOPSY Right 09/12/2015   BREAST LUMPECTOMY  WITH RADIOACTIVE SEED LOCALIZATION Right 09/12/2015   Procedure: RIGHT BREAST LUMPECTOMY WITH RADIOACTIVE SEED LOCALIZATION;  Surgeon: Debby Shipper, MD;  Location: [facility];  Service: General;  Laterality: Right;   COLONOSCOPY  [id number]   LEFT HEART CATHETERIZATION WITH CORONARY ANGIOGRAM N/A 06/26/2014   Procedure: LEFT HEART CATHETERIZATION WITH CORONARY ANGIOGRAM;  Surgeon: Alm LELON Clay, MD;  Location: [facility] CATH LAB;  Service: Cardiovascular;  Laterality: N/A;   TONSILLECTOMY  1957   TOTAL ABDOMINAL HYSTERECTOMY  2001   Family History  Problem Relation Age of Onset   Pneumonia Mother    Stroke Mother    Asthma Other    Allergies Other    Asthma Father    Colon cancer Neg Hx    Stomach cancer Neg Hx    Social History   Socioeconomic History   Marital status: Married    Spouse name: Not on file   Number of children: Not on file   Years of education: Not on file   Highest education level: Not on file  Occupational History   Not on file  Tobacco Use   Smoking status: Never   Smokeless tobacco: Never  Vaping Use   Vaping status: Never Used  Substance and Sexual Activity   Alcohol use: No   Drug use: No   Sexual activity: Not on file  Other Topics Concern   Not on file  Social History Narrative   Teacher - elementary school    Married    Two children    Three grandchildren       She likes to read    Social Drivers of Health   Financial Resource Strain: Low Risk  (06/02/2023)   Overall Financial Resource Strain (CARDIA)    Difficulty of Paying Living Expenses: Not hard at all  Food Insecurity: No Food Insecurity (06/02/2023)   Hunger Vital Sign    Worried About Running Out of Food in the Last Year: Never true    Ran Out of Food in the Last Year: Never true  Transportation Needs: No Transportation Needs (06/02/2023)   PRAPARE - Administrator, Civil Service (Medical): No    Lack of Transportation (Non-Medical): No  Physical Activity:  Insufficiently Active (06/02/2023)   Exercise Vital Sign    Days of Exercise per Week: 1 day    Minutes of Exercise per Session: 30 min  Stress: No Stress Concern Present (06/02/2023)   [employer] of Occupational Health - Occupational Stress Questionnaire    Feeling of Stress : Not at all  Social Connections: Socially Integrated (06/02/2023)   Social Connection and Isolation Panel [NHANES]    Frequency of Communication with Friends and Family: More than three times a week    Frequency of Social Gatherings with Friends and Family: More than three times a week    Attends Religious Services: More than 4 times  per year    Active Member of Clubs or Organizations: Yes    Attends Banker Meetings: More than 4 times per year    Marital Status: Married    Tobacco Counseling Counseling given: Not Answered   Clinical Intake:  Pre-visit preparation completed: Yes  Pain : No/denies pain     BMI - recorded: 25.39 Nutritional Status: BMI 25 -29 Overweight Nutritional Risks: None Diabetes: No  How often do you need to have someone help you when you read instructions, pamphlets, or other written materials from your doctor or pharmacy?: 1 - Never  Interpreter Needed?: No  Information entered by :: Rojelio Blush LPN   Activities of Daily Living    06/02/2023   10:11 AM  In your present state of health, do you have any difficulty performing the following activities:  Hearing? 0  Vision? 0  Difficulty concentrating or making decisions? 0  Walking or climbing stairs? 0  Dressing or bathing? 0  Doing errands, shopping? 0  Preparing Food and eating ? N  Using the Toilet? N  In the past six months, have you accidently leaked urine? N  Do you have problems with loss of bowel control? N  Managing your Medications? N  Managing your Finances? N  Housekeeping or managing your Housekeeping? N    Patient Care Team: Merna Huxley, NP as PCP - General (Family  Medicine) Treasa Faster, [facility] as Pharmacist (Pharmacist) Sheffield, Kelli R, PA-C as Physician Assistant (Dermatology)  Indicate any recent Medical Services you may have received from other than [facility] providers in the past year (date may be approximate).     Assessment:   This is a routine wellness examination for Pe Ell.  Hearing/Vision screen Hearing Screening - Comments:: Denies hearing difficulties   Vision Screening - Comments:: Wears rx glasses - up to date with routine eye exams with  [facility]   Goals Addressed               This Visit's Progress     Increase physical activity (pt-stated)         Depression Screen    06/02/2023   10:10 AM 12/28/2022    4:36 PM 05/27/2022   10:25 AM 01/21/2022    9:31 AM 09/18/2021    1:36 PM 06/13/2021   11:12 AM 05/23/2021    2:05 PM  PHQ 2/9 Scores  PHQ - 2 Score 0 1 0 0 0 0 0  PHQ- 9 Score  1   1 1      Fall Risk    06/02/2023   10:11 AM 05/27/2022   10:26 AM 01/21/2022    9:30 AM 09/18/2021    1:36 PM 05/23/2021    2:07 PM  Fall Risk   Falls in the past year? 0 0 0 0 0  Number falls in past yr: 0 0 0 0 0  Injury with Fall? 0 0 0 0 0  Risk for fall due to : No Fall Risks No Fall Risks No Fall Risks No Fall Risks   Follow up Falls prevention discussed Falls prevention discussed Falls evaluation completed Falls evaluation completed     MEDICARE RISK AT HOME: Medicare Risk at Home Any stairs in or around the home?: No If so, are there any without handrails?: No Home free of loose throw rugs in walkways, pet beds, electrical cords, etc?: Yes Adequate lighting in your home to reduce risk of falls?: Yes Life alert?: No Use of a cane, walker or  w/c?: No Grab bars in the bathroom?: Yes Shower chair or bench in shower?: Yes Elevated toilet seat or a handicapped toilet?: Yes  TIMED UP AND GO:  Was the test performed?  No    Cognitive Function:        06/02/2023   10:12 AM 05/27/2022   10:27 AM 05/23/2021    2:08 PM   6CIT Screen  What Year? 0 points 0 points 0 points  What month? 0 points 0 points 0 points  What time? 0 points 0 points 0 points  Count back from 20 0 points 0 points 0 points  Months in reverse 0 points 0 points 0 points  Repeat phrase 0 points 0 points 0 points  Total Score 0 points 0 points 0 points    Immunizations Immunization History  Administered Date(s) Administered   Fluad Quad(high Dose 65+) 02/22/2019, 04/11/2020   Influenza Split 03/13/2011, 03/23/2012   Influenza Whole 02/29/2008, 03/08/2009   Influenza, High Dose Seasonal PF 03/21/2014, 03/26/2015, 03/03/2016, 03/12/2017, 02/03/2018   Influenza,inj,Quad PF,6+ Mos 03/01/2013   PFIZER(Purple Top)SARS-COV-2 Vaccination 06/14/2019, 07/05/2019   Pneumococcal Conjugate-13 11/27/2013   Pneumococcal Polysaccharide-23 08/05/2009   Td 08/05/2009   Tdap 06/24/2020   Zoster, Live 11/28/2012    TDAP status: Up to date  Flu Vaccine status: Declined, Education has been provided regarding the importance of this vaccine but patient still declined. Advised may receive this vaccine at local pharmacy or Health Dept. Aware to provide a copy of the vaccination record if obtained from local pharmacy or Health Dept. Verbalized acceptance and understanding.  Pneumococcal vaccine status: Up to date  Covid-19 vaccine status: Declined, Education has been provided regarding the importance of this vaccine but patient still declined. Advised may receive this vaccine at local pharmacy or Health Dept.or vaccine clinic. Aware to provide a copy of the vaccination record if obtained from local pharmacy or Health Dept. Verbalized acceptance and understanding.  Qualifies for Shingles Vaccine? Yes   Zostavax completed No   Shingrix Completed?: No.    Education has been provided regarding the importance of this vaccine. Patient has been advised to call insurance company to determine out of pocket expense if they have not yet received this vaccine.  Advised may also receive vaccine at local pharmacy or Health Dept. Verbalized acceptance and understanding.  Screening Tests Health Maintenance  Topic Date Due   Zoster Vaccines- Shingrix (1 of 2) 12/31/1960   COVID-19 Vaccine (3 - Pfizer risk series) 08/02/2019   INFLUENZA VACCINE  12/24/2022   Medicare Annual Wellness (AWV)  06/01/2024   DTaP/Tdap/Td (3 - Td or Tdap) 06/24/2030   Pneumonia Vaccine 80+ Years old  Completed   DEXA SCAN  Completed   HPV VACCINES  Aged Out   Hepatitis C Screening  Discontinued    Health Maintenance  Health Maintenance Due  Topic Date Due   Zoster Vaccines- Shingrix (1 of 2) 12/31/1960   COVID-19 Vaccine (3 - Pfizer risk series) 08/02/2019   INFLUENZA VACCINE  12/24/2022        Bone Density status: Completed 01/26/23. Results reflect: Bone density results: OSTEOPENIA. Repeat every   years.   Additional Screening:    Vision Screening: Recommended annual ophthalmology exams for early detection of glaucoma and other disorders of the eye. Is the patient up to date with their annual eye exam?  Yes  Who is the provider or what is the name of the office in which the patient attends annual eye exams? [facility] If pt  is not established with a provider, would they like to be referred to a provider to establish care? No .   Dental Screening: Recommended annual dental exams for proper oral hygiene    Community Resource Referral / Chronic Care Management:  CRR required this visit?  No   CCM required this visit?  No      Plan:     I have personally reviewed and noted the following in the patient's chart:   Medical and social history Use of alcohol, tobacco or illicit drugs  Current medications and supplements including opioid prescriptions. Patient is not currently taking opioid prescriptions. Functional ability and status Nutritional status Physical activity Advanced directives List of other physicians Hospitalizations, surgeries,  and ER visits in previous 12 months Vitals Screenings to include cognitive, depression, and falls Referrals and appointments  In addition, I have reviewed and discussed with patient certain preventive protocols, quality metrics, and best practice recommendations. A written personalized care plan for preventive services as well as general preventive health recommendations were provided to patient.     Rojelio LELON Blush, LPN   12/23/7972   After Visit Summary: (MyChart) Due to this being a telephonic visit, the after visit summary with patients personalized plan was offered to patient via MyChart   Nurse Notes: None

## 2023-07-12 ENCOUNTER — Other Ambulatory Visit: Payer: Self-pay | Admitting: Adult Health

## 2023-07-12 DIAGNOSIS — I10 Essential (primary) hypertension: Secondary | ICD-10-CM

## 2023-08-27 ENCOUNTER — Ambulatory Visit: Payer: Self-pay

## 2023-08-27 ENCOUNTER — Ambulatory Visit (INDEPENDENT_AMBULATORY_CARE_PROVIDER_SITE_OTHER): Admitting: Family Medicine

## 2023-08-27 ENCOUNTER — Encounter: Payer: Self-pay | Admitting: Family Medicine

## 2023-08-27 VITALS — BP 132/85 | HR 80 | Temp 98.3°F | Ht 60.0 in | Wt 133.8 lb

## 2023-08-27 DIAGNOSIS — R55 Syncope and collapse: Secondary | ICD-10-CM

## 2023-08-27 DIAGNOSIS — R42 Dizziness and giddiness: Secondary | ICD-10-CM | POA: Diagnosis not present

## 2023-08-27 DIAGNOSIS — R0602 Shortness of breath: Secondary | ICD-10-CM

## 2023-08-27 NOTE — Progress Notes (Signed)
 Acute Office Visit  Subjective:     Patient ID: Katelyn [name], female    DOB: 22-Oct-1940, 83 y.o.   MRN: [id number]  Chief Complaint  Patient presents with   Shortness of Breath    Patient complains of shortness of breath and near syncope this morning while helping her husband with a shower    HPI Patient is in today for a near syncopal episode. 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 Then later when she was helping him in the shower she got down to help him and she felt a sudden difficulty breathing, felt like she couldn't "get enough air" was able to get up and sit on the bed, laid down until her daughter came to help her. States she got up after a bit, had a very dry mouth but otherwise her symptoms had resolved. She has had a mild headache today but otherwise currently in the office is no longer having any symptoms.   She does not have a history of any sort of lung disease history.  Review of Systems  All other systems reviewed and are negative.       Objective:    BP 132/85 (BP Location: Left Arm, Patient Position: Sitting, Cuff Size: Normal)   Pulse 80   Temp 98.3 F (36.8 C) (Oral)   Ht 5' (1.524 m)   Wt 133 lb 12.8 oz (60.7 kg)   SpO2 97%   BMI 26.13 kg/m    Physical Exam Vitals reviewed.  Constitutional:      Appearance: Normal appearance. She is well-groomed and normal weight.  Eyes:     Conjunctiva/sclera: Conjunctivae normal.  Neck:     Thyroid: No thyromegaly.  Cardiovascular:     Rate and Rhythm: Normal rate and regular rhythm.     Pulses: Normal pulses.     Heart sounds: S1 normal and S2 normal.  Pulmonary:     Effort: Pulmonary effort is normal.     Breath sounds: Normal breath sounds and air entry.  Abdominal:     General: Bowel sounds are normal.   Musculoskeletal:     Right lower leg: No edema.     Left lower leg: No edema.  Neurological:     Mental Status: She is alert and oriented to person, place, and time. Mental status is at baseline.     Gait: Gait is intact.  Psychiatric:        Mood and Affect: Mood and affect normal.        Speech: Speech normal.        Behavior: Behavior normal.        Judgment: Judgment normal.     No results found for any visits on 08/27/23.      Assessment & Plan:   Problem List Items Addressed This Visit       Unprioritized   DYSPNEA - Primary   Relevant Orders   Brain Natriuretic Peptide   CBC with Differential/Platelet   CMP   D-dimer, Quantitative   Other Visit Diagnoses       Postural dizziness with presyncope       Relevant Orders   Brain Natriuretic Peptide   CBC with Differential/Platelet   CMP   D-dimer, Quantitative  Based on her description it sounds like she had a vasovagal episode, however given her age and PMH I would prefer to rule out life threatening causes of syncope. I will order CBC, BNP, CMP, d dimer (pt takes estrogen) to rule out serious causes, then if these are negative I recommended an US of the carotids to rule out carotid artery stenosis. Her symptoms have completely resolved so things like blood clot, cardiac issues, and other causes are low on the list, however I think that it is appropriate given that this has never happened to her in the past.   No orders of the defined types were placed in this encounter.   No follow-ups on file.  Karie Georges, MD

## 2023-08-27 NOTE — Telephone Encounter (Signed)
 Copied from CRM [id number]. Topic: Clinical - Red Word Triage >> Aug 27, 2023  9:45 AM Carlatta H wrote: [facility] that prompted transfer to Nurse Triage: Patient is having shortness of breath and neck pain//Loss of breath when moving around//  Chief Complaint: Episode of SOB Symptoms: Feels thirsty Frequency: This morning Pertinent Negatives: Patient denies SOB at this time Disposition: [] ED /[] Urgent Care (no appt availability in office) / [x] Appointment(In office/virtual)/ []  [place] [facility]/ [] Home Care/ [] Refused Recommended Disposition /[] [place] [facility]/ []  Follow-up with PCP Additional Notes: Patient called in to report an episode of difficulty breathing that occurred this morning while she was helping her husband in the bathroom. Patient stated she "felt like she ran out of air". Patient stated she sat down to rest and feels normal at this time. Patient thinks she may have just "overdone" it too early in the morning. Patient oxygen saturation at 100%. Patient able to speak in clear and complete sentences while on phone with this RN. No work of breathing detected. Patient stated she had neck pain last night and is feeling thirsty right now, but denied additional symptoms. This RN advised patient to see a provider before weekend, per protocol. No availability with PCP. Scheduled patient with alternate provider in office today. Provided care advice and instructed patient to call back if symptoms worsen. Patient complied.   Reason for Disposition  [1] MILD difficulty breathing (e.g., minimal/no SOB at rest, SOB with walking, pulse <100) AND [2] NEW-onset or WORSE than normal  Answer Assessment - Initial Assessment Questions 1. RESPIRATORY STATUS: "Describe your breathing?" (e.g., wheezing, shortness of breath, unable to speak, severe coughing)      States she felt "like I ran out of air" while helping husband this morning, breathing is back to normal now 2. ONSET: "When did this  breathing problem begin?"      This morning 3. PATTERN "Does the difficult breathing come and go, or has it been constant since it started?"      States she is feeling better now 4. SEVERITY: "How bad is your breathing?" (e.g., mild, moderate, severe)    - MILD: No SOB at rest, mild SOB with walking, speaks normally in sentences, can lie down, no retractions, pulse < 100.    - MODERATE: SOB at rest, SOB with minimal exertion and prefers to sit, cannot lie down flat, speaks in phrases, mild retractions, audible wheezing, pulse 100-120.    - SEVERE: Very SOB at rest, speaks in single words, struggling to breathe, sitting hunched forward, retractions, pulse > 120      Denies SOB now, mild/moderate at time of helping husband out of shower 5. RECURRENT SYMPTOM: "Have you had difficulty breathing before?" If Yes, ask: "When was the last time?" and "What happened that time?"      Denies 6. CARDIAC HISTORY: "Do you have any history of heart disease?" (e.g., heart attack, angina, bypass surgery, angioplasty)      Denies 7. LUNG HISTORY: "Do you have any history of lung disease?"  (e.g., pulmonary embolus, asthma, emphysema)     Denies 8. CAUSE: "What do you think is causing the breathing problem?"      Unknown 9. OTHER SYMPTOMS: "Do you have any other symptoms? (e.g., dizziness, runny nose, cough, chest pain, fever)     Neck pain last night, states she is thirsty 10. O2 SATURATION MONITOR:  "Do you use an oxygen saturation monitor (pulse oximeter) at home?" If Yes, ask: "What is your  reading (oxygen level) today?" "What is your usual oxygen saturation reading?" (e.g., 95%)     100%  Protocols used: Breathing Difficulty-A-AH

## 2023-08-27 NOTE — Telephone Encounter (Signed)
 Noted.

## 2023-08-30 ENCOUNTER — Other Ambulatory Visit

## 2023-08-30 DIAGNOSIS — R55 Syncope and collapse: Secondary | ICD-10-CM | POA: Diagnosis not present

## 2023-08-30 DIAGNOSIS — R0602 Shortness of breath: Secondary | ICD-10-CM | POA: Diagnosis not present

## 2023-08-30 DIAGNOSIS — R42 Dizziness and giddiness: Secondary | ICD-10-CM | POA: Diagnosis not present

## 2023-09-01 ENCOUNTER — Encounter: Payer: Self-pay | Admitting: Family Medicine

## 2023-09-01 LAB — COMPREHENSIVE METABOLIC PANEL WITH GFR
ALT: 20 IU/L (ref 0–32)
AST: 21 IU/L (ref 0–40)
Albumin: 4.4 g/dL (ref 3.7–4.7)
Alkaline Phosphatase: 68 IU/L (ref 44–121)
BUN/Creatinine Ratio: 20 (ref 12–28)
BUN: 15 mg/dL (ref 8–27)
Bilirubin Total: 0.5 mg/dL (ref 0.0–1.2)
CO2: 23 mmol/L (ref 20–29)
Calcium: 9.7 mg/dL (ref 8.7–10.3)
Chloride: 101 mmol/L (ref 96–106)
Creatinine, Ser: 0.75 mg/dL (ref 0.57–1.00)
Globulin, Total: 2.5 g/dL (ref 1.5–4.5)
Glucose: 98 mg/dL (ref 70–99)
Potassium: 4.5 mmol/L (ref 3.5–5.2)
Sodium: 141 mmol/L (ref 134–144)
Total Protein: 6.9 g/dL (ref 6.0–8.5)
eGFR: 80 mL/min/{1.73_m2} (ref >59)

## 2023-09-01 LAB — CBC WITH DIFFERENTIAL/PLATELET
Basophils Absolute: 0 10*3/uL (ref 0.0–0.2)
Basos: 0 %
EOS (ABSOLUTE): 0.2 10*3/uL (ref 0.0–0.4)
Eos: 3 %
Hematocrit: 44.2 % (ref 34.0–46.6)
Hemoglobin: 14.8 g/dL (ref 11.1–15.9)
Immature Grans (Abs): 0 10*3/uL (ref 0.0–0.1)
Immature Granulocytes: 0 %
Lymphocytes Absolute: 2.6 10*3/uL (ref 0.7–3.1)
Lymphs: 33 %
MCH: 30.8 pg (ref 26.6–33.0)
MCHC: 33.5 g/dL (ref 31.5–35.7)
MCV: 92 fL (ref 79–97)
Monocytes Absolute: 0.6 10*3/uL (ref 0.1–0.9)
Monocytes: 8 %
Neutrophils Absolute: 4.5 10*3/uL (ref 1.4–7.0)
Neutrophils: 56 %
Platelets: 273 10*3/uL (ref 150–450)
RBC: 4.81 x10E6/uL (ref 3.77–5.28)
RDW: 12.2 % (ref 11.7–15.4)
WBC: 8 10*3/uL (ref 3.4–10.8)

## 2023-09-01 LAB — BRAIN NATRIURETIC PEPTIDE: BNP: 26.4 pg/mL (ref 0.0–100.0)

## 2023-09-01 LAB — D-DIMER, QUANTITATIVE: D-DIMER: 0.21 mg{FEU}/L (ref 0.00–0.49)

## 2023-10-12 ENCOUNTER — Other Ambulatory Visit: Payer: Self-pay | Admitting: Adult Health

## 2023-10-12 DIAGNOSIS — I10 Essential (primary) hypertension: Secondary | ICD-10-CM

## 2024-01-07 NOTE — Progress Notes (Signed)
   01/07/2024  Patient ID: Katelyn [name], female   DOB: 13-Mar-1941, 83 y.o.   MRN: [id number]  Pharmacy Quality Measure Review  This patient is appearing on a report for being at risk of failing the adherence measure for cholesterol (statin) medications this calendar year.   Medication: Pravastatin  40mg  Last fill date: 11/14/23 for 90 day supply  Insurance report was not up to date. No action needed at this time.   Jon VEAR Lindau, PharmD Clinical Pharmacist [phone]

## 2024-01-12 ENCOUNTER — Other Ambulatory Visit: Payer: Self-pay | Admitting: Adult Health

## 2024-01-12 DIAGNOSIS — I10 Essential (primary) hypertension: Secondary | ICD-10-CM

## 2024-01-20 DIAGNOSIS — L821 Other seborrheic keratosis: Secondary | ICD-10-CM | POA: Diagnosis not present

## 2024-01-20 DIAGNOSIS — D1801 Hemangioma of skin and subcutaneous tissue: Secondary | ICD-10-CM | POA: Diagnosis not present

## 2024-01-20 DIAGNOSIS — L570 Actinic keratosis: Secondary | ICD-10-CM | POA: Diagnosis not present

## 2024-01-20 DIAGNOSIS — Z85828 Personal history of other malignant neoplasm of skin: Secondary | ICD-10-CM | POA: Diagnosis not present

## 2024-01-20 DIAGNOSIS — L578 Other skin changes due to chronic exposure to nonionizing radiation: Secondary | ICD-10-CM | POA: Diagnosis not present

## 2024-01-20 DIAGNOSIS — Z411 Encounter for cosmetic surgery: Secondary | ICD-10-CM | POA: Diagnosis not present

## 2024-01-20 DIAGNOSIS — D229 Melanocytic nevi, unspecified: Secondary | ICD-10-CM | POA: Diagnosis not present

## 2024-01-20 DIAGNOSIS — L814 Other melanin hyperpigmentation: Secondary | ICD-10-CM | POA: Diagnosis not present

## 2024-02-15 ENCOUNTER — Other Ambulatory Visit: Payer: Self-pay | Admitting: Adult Health

## 2024-02-15 DIAGNOSIS — I10 Essential (primary) hypertension: Secondary | ICD-10-CM

## 2024-02-21 ENCOUNTER — Other Ambulatory Visit: Payer: Self-pay | Admitting: Adult Health

## 2024-02-21 DIAGNOSIS — I10 Essential (primary) hypertension: Secondary | ICD-10-CM

## 2024-03-13 ENCOUNTER — Other Ambulatory Visit: Payer: Self-pay | Admitting: Adult Health

## 2024-03-15 DIAGNOSIS — Z780 Asymptomatic menopausal state: Secondary | ICD-10-CM | POA: Diagnosis not present

## 2024-03-15 DIAGNOSIS — Z79890 Hormone replacement therapy (postmenopausal): Secondary | ICD-10-CM | POA: Diagnosis not present

## 2024-03-15 DIAGNOSIS — Z13820 Encounter for screening for osteoporosis: Secondary | ICD-10-CM | POA: Diagnosis not present

## 2024-03-15 DIAGNOSIS — Z01419 Encounter for gynecological examination (general) (routine) without abnormal findings: Secondary | ICD-10-CM | POA: Diagnosis not present

## 2024-03-15 DIAGNOSIS — N898 Other specified noninflammatory disorders of vagina: Secondary | ICD-10-CM | POA: Diagnosis not present

## 2024-03-15 DIAGNOSIS — Z1231 Encounter for screening mammogram for malignant neoplasm of breast: Secondary | ICD-10-CM | POA: Diagnosis not present

## 2024-03-16 ENCOUNTER — Other Ambulatory Visit (HOSPITAL_BASED_OUTPATIENT_CLINIC_OR_DEPARTMENT_OTHER): Payer: Self-pay | Admitting: Gynecology

## 2024-03-16 DIAGNOSIS — Z13820 Encounter for screening for osteoporosis: Secondary | ICD-10-CM

## 2024-03-22 ENCOUNTER — Other Ambulatory Visit: Payer: Self-pay | Admitting: Adult Health

## 2024-03-22 DIAGNOSIS — I10 Essential (primary) hypertension: Secondary | ICD-10-CM

## 2024-04-04 ENCOUNTER — Telehealth: Payer: Self-pay

## 2024-04-04 NOTE — Telephone Encounter (Signed)
 Pt is due for CPE. Tried calling no answer. Rx sent in for 30 days.

## 2024-04-04 NOTE — Telephone Encounter (Signed)
-----   Message from Jon VEAR Lindau sent at 04/04/2024 11:16 AM EST ----- Hello,  Pharmacy requesting refill for the following medication: Pravastatin  40mg  1 tablet daily  Pharmacy Info: [id number] - [place], [place] - [address] [place] [address] Phone: [phone]  Fax: [phone]  Thank you! Jon VEAR Lindau, PharmD Clinical Pharmacist [phone]

## 2024-04-04 NOTE — Progress Notes (Signed)
   04/04/2024  Patient ID: Katelyn [name], female   DOB: 1941-04-25, 83 y.o.   MRN: [id number]  Pharmacy Quality Measure Review  This patient is appearing on a report for being at risk of failing the adherence measure for cholesterol (statin) medications this calendar year.   Medication: Pravastatin  40mg  Last fill date: 11/14/23 for 90 day supply  Rx out of refills. Message sent to coordinate refill  Jon VEAR Lindau, PharmD Clinical Pharmacist [phone]

## 2024-04-05 DIAGNOSIS — H353123 Nonexudative age-related macular degeneration, left eye, advanced atrophic without subfoveal involvement: Secondary | ICD-10-CM | POA: Diagnosis not present

## 2024-04-12 ENCOUNTER — Other Ambulatory Visit: Payer: Self-pay | Admitting: Adult Health

## 2024-04-13 NOTE — Telephone Encounter (Unsigned)
 Copied from CRM #[id number]. Topic: Clinical - Medication Question >> Apr 12, 2024  4:03 PM Shanda MATSU wrote: Reason for CRM: Patient called in to see why refill req for med, hydrochlorothiazide  (HYDRODIURIL ) 12.5 MG tablet, was refused, adv patient that I show an appt is needed in order for refill to be done. Patient is wanting to know why she needs to come in to have med refilled, patient is req a call back in regards to this, patient stated that it is hard for her to get to appts due to spouse being bedridden.

## 2024-04-14 ENCOUNTER — Other Ambulatory Visit: Payer: Self-pay | Admitting: Adult Health

## 2024-04-17 ENCOUNTER — Other Ambulatory Visit: Payer: Self-pay | Admitting: Adult Health

## 2024-04-17 DIAGNOSIS — I10 Essential (primary) hypertension: Secondary | ICD-10-CM

## 2024-05-11 ENCOUNTER — Encounter: Payer: Self-pay | Admitting: Adult Health

## 2024-05-11 ENCOUNTER — Ambulatory Visit (INDEPENDENT_AMBULATORY_CARE_PROVIDER_SITE_OTHER): Admitting: Adult Health

## 2024-05-11 VITALS — BP 138/80 | HR 78 | Temp 98.0°F | Ht 59.5 in | Wt 119.0 lb

## 2024-05-11 DIAGNOSIS — E785 Hyperlipidemia, unspecified: Secondary | ICD-10-CM | POA: Diagnosis not present

## 2024-05-11 DIAGNOSIS — I2510 Atherosclerotic heart disease of native coronary artery without angina pectoris: Secondary | ICD-10-CM | POA: Diagnosis not present

## 2024-05-11 DIAGNOSIS — I10 Essential (primary) hypertension: Secondary | ICD-10-CM

## 2024-05-11 DIAGNOSIS — Z0000 Encounter for general adult medical examination without abnormal findings: Secondary | ICD-10-CM | POA: Diagnosis not present

## 2024-05-11 DIAGNOSIS — M8589 Other specified disorders of bone density and structure, multiple sites: Secondary | ICD-10-CM | POA: Diagnosis not present

## 2024-05-11 LAB — CBC WITH DIFFERENTIAL/PLATELET
Basophils Absolute: 0 K/uL (ref 0.0–0.1)
Basophils Relative: 0.4 % (ref 0.0–3.0)
Eosinophils Absolute: 0.2 K/uL (ref 0.0–0.7)
Eosinophils Relative: 2.4 % (ref 0.0–5.0)
HCT: 41 % (ref 36.0–46.0)
Hemoglobin: 14.1 g/dL (ref 12.0–15.0)
Lymphocytes Relative: 28.2 % (ref 12.0–46.0)
Lymphs Abs: 2.2 K/uL (ref 0.7–4.0)
MCHC: 34.4 g/dL (ref 30.0–36.0)
MCV: 87.9 fl (ref 78.0–100.0)
Monocytes Absolute: 0.6 K/uL (ref 0.1–1.0)
Monocytes Relative: 7.6 % (ref 3.0–12.0)
Neutro Abs: 4.8 K/uL (ref 1.4–7.7)
Neutrophils Relative %: 61.4 % (ref 43.0–77.0)
Platelets: 296 K/uL (ref 150.0–400.0)
RBC: 4.67 Mil/uL (ref 3.87–5.11)
RDW: 13.2 % (ref 11.5–15.5)
WBC: 7.8 K/uL (ref 4.0–10.5)

## 2024-05-11 LAB — LIPID PANEL
Cholesterol: 239 mg/dL — ABNORMAL HIGH (ref 28–200)
HDL: 50.2 mg/dL (ref >39.00)
LDL Cholesterol: 159 mg/dL — ABNORMAL HIGH (ref 10–99)
NonHDL: 188.83
Total CHOL/HDL Ratio: 5
Triglycerides: 148 mg/dL (ref 10.0–149.0)
VLDL: 29.6 mg/dL (ref 0.0–40.0)

## 2024-05-11 LAB — COMPREHENSIVE METABOLIC PANEL WITH GFR
ALT: 17 U/L (ref 3–35)
AST: 20 U/L (ref 5–37)
Albumin: 4.5 g/dL (ref 3.5–5.2)
Alkaline Phosphatase: 70 U/L (ref 39–117)
BUN: 14 mg/dL (ref 6–23)
CO2: 30 meq/L (ref 19–32)
Calcium: 9.6 mg/dL (ref 8.4–10.5)
Chloride: 102 meq/L (ref 96–112)
Creatinine, Ser: 0.64 mg/dL (ref 0.40–1.20)
GFR: 82.34 mL/min (ref >60.00)
Glucose, Bld: 98 mg/dL (ref 70–99)
Potassium: 3.9 meq/L (ref 3.5–5.1)
Sodium: 141 meq/L (ref 135–145)
Total Bilirubin: 0.7 mg/dL (ref 0.2–1.2)
Total Protein: 7.3 g/dL (ref 6.0–8.3)

## 2024-05-11 LAB — TSH: TSH: 1.07 u[IU]/mL (ref 0.35–5.50)

## 2024-05-11 MED ORDER — ATENOLOL 25 MG PO TABS: 25.0000 mg | ORAL_TABLET | Freq: Every day | ORAL | 3 refills | Status: AC

## 2024-05-11 MED ORDER — HYDROCHLOROTHIAZIDE 12.5 MG PO TABS: 12.5000 mg | ORAL_TABLET | Freq: Every day | ORAL | 3 refills | Status: AC

## 2024-05-11 NOTE — Progress Notes (Signed)
 Subjective:    Patient ID: Katelyn [name], female    DOB: 10/15/1940, 83 y.o.   MRN: [id number]  HPI Patient presents for yearly preventative medicine examination. She is a pleasant 83 year old female who  has a past medical history of Anxiety state, unspecified, Atypical mole (2015), Basal cell carcinoma (10/17/2013), Disorder of bone and cartilage, unspecified, Osteoarthrosis, unspecified whether generalized or localized, unspecified site, Other and unspecified hyperlipidemia, Palpitations, Shortness of breath, and Unspecified essential hypertension.  Hypertension -managed with atenolol  25 mg daily and HCTZ 12.5 mg daily.  She does monitor her blood pressures at home with readings in the 120s over 70s to 80s.  She denies dizziness, lightheadedness, chest pain, or shortness of breath. She took her medication just prior to arrival.  BP Readings from Last 3 Encounters:  05/11/24 138/80  08/27/23 132/85  01/26/23 120/80   Hyperlipidemia/CAD-managed with pravastatin  40 mg at bedtime and aspirin  81 mg daily.  She denies myalgia or fatigue.  In the past she has failed atorvastatin  which caused myalgia. Has not had any CP, DOE, or dyspnea. She has not been taking her cholesterol medication for the last few months as she wanted to take a vacation from it Lab Results  Component Value Date   CHOL 171 01/26/2023   HDL 40.20 01/26/2023   LDLCALC 98 01/26/2023   TRIG 166.0 (H) 01/26/2023   CHOLHDL 4 01/26/2023   Osteopenia-takes vitamin D  and calcium  daily.  Also wears on estradiol 0.05 mg/24-hour patch, 1 patch twice weekly that is prescribed by GYN. Her las bone density screen was in 2013, she has was ordered but not scheduled.   All immunizations and health maintenance protocols were reviewed with the patient and needed orders were placed.  Appropriate screening laboratory values were ordered for the patient including screening of hyperlipidemia, renal function and hepatic function. If  indicated by BPH, a PSA was ordered  Medication reconciliation,  past medical history, social history, problem list and allergies were reviewed in detail with the patient  Goals were established with regard to weight loss, exercise, and  diet in compliance with medications Wt Readings from Last 3 Encounters:  05/11/24 119 lb (54 kg)  08/27/23 133 lb 12.8 oz (60.7 kg)  06/02/23 130 lb (59 kg)    Review of Systems  Constitutional: Negative.   HENT: Negative.    Eyes: Negative.   Respiratory: Negative.    Cardiovascular: Negative.   Gastrointestinal: Negative.   Endocrine: Negative.   Genitourinary: Negative.   Musculoskeletal: Negative.   Skin: Negative.   Allergic/Immunologic: Negative.   Neurological: Negative.   Hematological: Negative.   Psychiatric/Behavioral: Negative.     Past Medical History:  Diagnosis Date   Anxiety state, unspecified    Atypical mole 2015   left ant thigh moderate   Basal cell carcinoma 10/17/2013   right side of neck tx with bx   Disorder of bone and cartilage, unspecified    Osteoarthrosis, unspecified whether generalized or localized, unspecified site    Other and unspecified hyperlipidemia    Palpitations    Shortness of breath    Unspecified essential hypertension     Social History   Socioeconomic History   Marital status: Married    Spouse name: Not on file   Number of children: Not on file   Years of education: Not on file   Highest education level: Not on file  Occupational History   Not on file  Tobacco Use   Smoking status:  Never   Smokeless tobacco: Never  Vaping Use   Vaping status: Never Used  Substance and Sexual Activity   Alcohol use: No   Drug use: No   Sexual activity: Not on file  Other Topics Concern   Not on file  Social History Narrative   Teacher - elementary school    Married    Two children    Three grandchildren       She likes to read    Social Drivers of Health   Tobacco Use: Low Risk  (05/11/2024)   Patient History    Smoking Tobacco Use: Never    Smokeless Tobacco Use: Never    Passive Exposure: Not on file  Financial Resource Strain: Low Risk (06/02/2023)   Overall Financial Resource Strain (CARDIA)    Difficulty of Paying Living Expenses: Not hard at all  Food Insecurity: No Food Insecurity (06/02/2023)   Hunger Vital Sign    Worried About Running Out of Food in the Last Year: Never true    Ran Out of Food in the Last Year: Never true  Transportation Needs: No Transportation Needs (06/02/2023)   PRAPARE - Administrator, Civil Service (Medical): No    Lack of Transportation (Non-Medical): No  Physical Activity: Insufficiently Active (06/02/2023)   Exercise Vital Sign    Days of Exercise per Week: 1 day    Minutes of Exercise per Session: 30 min  Stress: No Stress Concern Present (06/02/2023)   [employer] of Occupational Health - Occupational Stress Questionnaire    Feeling of Stress : Not at all  Social Connections: Socially Integrated (06/02/2023)   Social Connection and Isolation Panel    Frequency of Communication with Friends and Family: More than three times a week    Frequency of Social Gatherings with Friends and Family: More than three times a week    Attends Religious Services: More than 4 times per year    Active Member of Clubs or Organizations: Yes    Attends Banker Meetings: More than 4 times per year    Marital Status: Married  Catering Manager Violence: Not At Risk (06/02/2023)   Humiliation, Afraid, Rape, and Kick questionnaire    Fear of Current or Ex-Partner: No    Emotionally Abused: No    Physically Abused: No    Sexually Abused: No  Depression (PHQ2-9): Low Risk (06/02/2023)   Depression (PHQ2-9)    PHQ-2 Score: 0  Alcohol Screen: Low Risk (06/02/2023)   Alcohol Screen    Last Alcohol Screening Score (AUDIT): 0  Housing: Unknown (06/02/2023)   Housing Stability Vital Sign    Unable to Pay for Housing in the Last  Year: No    Number of Times Moved in the Last Year: Not on file    Homeless in the Last Year: No  Utilities: Not At Risk (06/02/2023)   [employer] Utilities    Threatened with loss of utilities: No  Health Literacy: Adequate Health Literacy (06/02/2023)   B1300 Health Literacy    Frequency of need for help with medical instructions: Never    Past Surgical History:  Procedure Laterality Date   BILATERAL SALPINGOOPHORECTOMY  2001   BREAST CYST ASPIRATION  2001   BREAST EXCISIONAL BIOPSY Right 09/12/2015   BREAST LUMPECTOMY WITH RADIOACTIVE SEED LOCALIZATION Right 09/12/2015   Procedure: RIGHT BREAST LUMPECTOMY WITH RADIOACTIVE SEED LOCALIZATION;  Surgeon: Debby Shipper, MD;  Location: [facility];  Service: General;  Laterality: Right;   COLONOSCOPY  [id number]   LEFT HEART CATHETERIZATION WITH CORONARY ANGIOGRAM N/A 06/26/2014   Procedure: LEFT HEART CATHETERIZATION WITH CORONARY ANGIOGRAM;  Surgeon: Alm LELON Clay, MD;  Location: [facility] CATH LAB;  Service: Cardiovascular;  Laterality: N/A;   TONSILLECTOMY  1957   TOTAL ABDOMINAL HYSTERECTOMY  2001    Family History  Problem Relation Age of Onset   Pneumonia Mother    Stroke Mother    Asthma Other    Allergies Other    Asthma Father    Colon cancer Neg Hx    Stomach cancer Neg Hx     Allergies[1]  Medications Ordered Prior to Encounter[2]  BP 138/80   Pulse 78   Temp 98 F (36.7 C) (Oral)   Ht 4' 11.5 (1.511 m)   Wt 119 lb (54 kg)   SpO2 97%   BMI 23.63 kg/m       Objective:   Physical Exam Vitals and nursing note reviewed.  Constitutional:      General: She is not in acute distress.    Appearance: Normal appearance. She is not ill-appearing.  HENT:     Head: Normocephalic and atraumatic.     Right Ear: Tympanic membrane, ear canal and external ear normal. There is no impacted cerumen.     Left Ear: Tympanic membrane, ear canal and external ear normal. There is no impacted cerumen.     Nose: Nose normal. No  congestion or rhinorrhea.     Mouth/Throat:     Mouth: Mucous membranes are moist.     Pharynx: Oropharynx is clear.  Eyes:     Extraocular Movements: Extraocular movements intact.     Conjunctiva/sclera: Conjunctivae normal.     Pupils: Pupils are equal, round, and reactive to light.  Neck:     Vascular: No carotid bruit.  Cardiovascular:     Rate and Rhythm: Normal rate and regular rhythm.     Pulses: Normal pulses.     Heart sounds: No murmur heard.    No friction rub. No gallop.  Pulmonary:     Effort: Pulmonary effort is normal.     Breath sounds: Normal breath sounds.  Abdominal:     General: Abdomen is flat. Bowel sounds are normal. There is no distension.     Palpations: Abdomen is soft. There is no mass.     Tenderness: There is no abdominal tenderness. There is no guarding or rebound.     Hernia: No hernia is present.  Musculoskeletal:        General: Normal range of motion.     Cervical back: Normal range of motion and neck supple.  Lymphadenopathy:     Cervical: No cervical adenopathy.  Skin:    General: Skin is warm and dry.     Capillary Refill: Capillary refill takes less than 2 seconds.  Neurological:     General: No focal deficit present.     Mental Status: She is alert and oriented to person, place, and time.  Psychiatric:        Mood and Affect: Mood normal.        Behavior: Behavior normal.        Thought Content: Thought content normal.        Judgment: Judgment normal.       Assessment & Plan:  1. Routine general medical examination at a health care facility (Primary) Today patient counseled on age appropriate routine health concerns for screening and prevention, each reviewed and up to date or declined. Immunizations  reviewed and up to date or declined. Labs ordered and reviewed. Risk factors for depression reviewed and negative. Hearing function and visual acuity are intact. ADLs screened and addressed as needed. Functional ability and level of  safety reviewed and appropriate. Education, counseling and referrals performed based on assessed risks today. Patient provided with a copy of personalized plan for preventive services. - Follow up for CPE in one year or sooner if needed.   2. Essential hypertension - well controlled. No change in medication  - CBC with Differential/Platelet; Future - Comprehensive metabolic panel with GFR; Future - Lipid panel; Future - TSH; Future - atenolol  (TENORMIN ) 25 MG tablet; Take 1 tablet (25 mg total) by mouth daily.  Dispense: 90 tablet; Refill: 3 - hydrochlorothiazide  (HYDRODIURIL ) 12.5 MG tablet; Take 1 tablet (12.5 mg total) by mouth daily.  Dispense: 90 tablet; Refill: 3 - potassium chloride  SA (KLOR-CON  M) 20 MEQ tablet; Take 1 tablet (20 mEq total) by mouth daily.  Dispense: 90 tablet; Refill: 3  3. Coronary artery disease involving native coronary artery of native heart without angina pectoris - Likely need to go back on statin therapy  - CBC with Differential/Platelet; Future - Comprehensive metabolic panel with GFR; Future - Lipid panel; Future - TSH; Future  4. Dyslipidemia  - CBC with Differential/Platelet; Future - Comprehensive metabolic panel with GFR; Future - Lipid panel; Future - TSH; Future  5. Osteopenia of multiple sites - Phone number given to call and schedule Bone Density Screen   Darleene Shape, NP       [1]  Allergies Allergen Reactions   Penicillins     REACTION: rash   Tetracycline     REACTION: rash  [2]  Current Outpatient Medications on File Prior to Visit  Medication Sig Dispense Refill   Ascorbic Acid (VITAMIN C) 250 MG CHEW Chew 250 mg by mouth daily. (gummies)     aspirin  81 MG EC tablet Take 81 mg by mouth daily.       atenolol  (TENORMIN ) 25 MG tablet Take 1 tablet by mouth once daily 30 tablet 0   Calcium -Phosphorus-Vitamin D  (CALCIUM  GUMMIES PO) Take 500 mg by mouth daily.     Cholecalciferol (VITAMIN D3) 2000 UNITS TABS Take 1 tablet by  mouth daily.       Cyanocobalamin (VITAMIN B-12) 5000 MCG TBDP Take 1 tablet by mouth daily.      estradiol (VIVELLE-DOT) 0.05 MG/24HR patch APPLY 1 PATCH TOPICALLY TWICE A WEEK     estradiol (VIVELLE-DOT) 0.1 MG/24HR patch Apply 1 patch twice a week by transdermal route.     fluorouracil (EFUDEX) 5 % cream Apply topically 2 (two) times daily.     fluticasone  (FLONASE ) 50 MCG/ACT nasal spray Place 1 spray into both nostrils daily. 16 g 0   hydrochlorothiazide  (HYDRODIURIL ) 12.5 MG tablet Take 1 tablet by mouth once daily 30 tablet 0   LUTEIN PO Take 1 tablet by mouth daily.     Multiple Vitamins-Minerals (ICAPS AREDS 2 PO) Take by mouth.     potassium chloride  SA (KLOR-CON  M) 20 MEQ tablet Take 1 tablet by mouth once daily 90 tablet 0   pravastatin  (PRAVACHOL ) 40 MG tablet TAKE 1 TABLET BY MOUTH AT BEDTIME 90 tablet 3   tretinoin  (RETIN-A ) 0.05 % cream Apply topically at bedtime.     No current facility-administered medications on file prior to visit.

## 2024-05-11 NOTE — Patient Instructions (Signed)
 It was great seeing you today   We will follow up with you regarding your lab work   Please let me know if you need anything    [facility]   [phone]

## 2024-05-12 ENCOUNTER — Ambulatory Visit: Payer: Self-pay | Admitting: Adult Health
